# Patient Record
Sex: Female | Born: 1960 | Race: White | Marital: Married | Smoking: Former smoker
Health system: Southern US, Community
[De-identification: ages and names within clinical notes are randomized; demographics above are authoritative.]

## PROBLEM LIST (undated history)

## (undated) DIAGNOSIS — K602 Anal fissure, unspecified: Secondary | ICD-10-CM

## (undated) DIAGNOSIS — N809 Endometriosis, unspecified: Secondary | ICD-10-CM

## (undated) DIAGNOSIS — K635 Polyp of colon: Secondary | ICD-10-CM

## (undated) DIAGNOSIS — K5904 Chronic idiopathic constipation: Secondary | ICD-10-CM

## (undated) DIAGNOSIS — J309 Allergic rhinitis, unspecified: Secondary | ICD-10-CM

## (undated) HISTORY — DX: Polyp of colon: K63.5

## (undated) HISTORY — PX: HEMORRHOID SURGERY: SHX153

## (undated) HISTORY — DX: Allergic rhinitis, unspecified: J30.9

## (undated) HISTORY — PX: VASCULAR SURGERY: SHX849

## (undated) HISTORY — DX: Chronic idiopathic constipation: K59.04

## (undated) HISTORY — PX: ABDOMINAL HYSTERECTOMY: SHX81

## (undated) HISTORY — DX: Anal fissure, unspecified: K60.2

## (undated) HISTORY — DX: Endometriosis, unspecified: N80.9

---

## 2021-08-27 ENCOUNTER — Telehealth: Payer: Self-pay | Admitting: Family Medicine

## 2021-08-27 ENCOUNTER — Encounter: Payer: BC Managed Care – PPO | Admitting: Family Medicine

## 2021-08-27 NOTE — Telephone Encounter (Signed)
Spoke with patient advised intake plenty of fluids if symptoms become worse she may need to be seen at urgent care.

## 2021-09-02 ENCOUNTER — Other Ambulatory Visit: Payer: Self-pay

## 2021-09-02 ENCOUNTER — Ambulatory Visit (INDEPENDENT_AMBULATORY_CARE_PROVIDER_SITE_OTHER): Payer: BC Managed Care – PPO

## 2021-09-02 ENCOUNTER — Ambulatory Visit (INDEPENDENT_AMBULATORY_CARE_PROVIDER_SITE_OTHER): Payer: BC Managed Care – PPO | Admitting: Podiatry

## 2021-09-02 DIAGNOSIS — M7671 Peroneal tendinitis, right leg: Secondary | ICD-10-CM | POA: Diagnosis not present

## 2021-09-02 DIAGNOSIS — M79671 Pain in right foot: Secondary | ICD-10-CM

## 2021-09-02 MED ORDER — MELOXICAM 15 MG PO TABS: 15.0000 mg | ORAL_TABLET | Freq: Every day | ORAL | 1 refills | Status: DC

## 2021-09-11 DIAGNOSIS — M7671 Peroneal tendinitis, right leg: Secondary | ICD-10-CM | POA: Diagnosis not present

## 2021-09-11 MED ORDER — BETAMETHASONE SOD PHOS & ACET 6 (3-3) MG/ML IJ SUSP
3.0000 mg | Freq: Once | INTRAMUSCULAR | Status: AC
Start: 2021-09-11 — End: 2021-09-11
  Administered 2021-09-11: 3 mg via INTRA_ARTICULAR

## 2021-09-11 NOTE — Progress Notes (Signed)
   HPI: 60 y.o. female presenting today as a new patient who recently moved from [place] to be with her grandkids for evaluation of right foot pain this been going on for approximately 1-2 months.  She noticed a bone sticking out of the outside of her foot.  She also says that just yesterday she began to have some right heel pain.  She has not done anything for treatment.  She presents for further treatment and evaluation  No past medical history on file.   Physical Exam: General: The patient is alert and oriented x3 in no acute distress.  Dermatology: Skin is warm, dry and supple bilateral lower extremities. Negative for open lesions or macerations.  Vascular: Palpable pedal pulses bilaterally. No edema or erythema noted. Capillary refill within normal limits.  Neurological: Epicritic and protective threshold grossly intact bilaterally.   Musculoskeletal Exam: Range of motion within normal limits to all pedal and ankle joints bilateral. Muscle strength 5/5 in all groups bilateral.  Pain on palpation along the peroneal tendon as it inserts onto the fifth metatarsal tubercle.  There is also some pain on palpation to the plantar fascial right  Radiographic Exam:  Normal osseous mineralization. Joint spaces preserved. No fracture/dislocation/boney destruction.    Assessment: 1.  Insertional peroneal tendinitis right 2.  Acute plantar fasciitis right x1 day   Plan of Care:  1. Patient evaluated. X-Rays reviewed.  2.  Injection of 0.5 cc Celestone Soluspan injected along the peroneal tendon sheath as it inserts on the fifth metatarsal tubercle 3.  Prescription for meloxicam 15 mg daily 4.  Cam boot dispensed.  Weightbearing as tolerated x4 weeks 5.  Return to clinic in 4 weeks  *Recently moved from [place] to be by grandkids      Edrick Kins, DPM [facility]  Dr. Edrick Kins, DPM    [address], [place] [address]                 Office [phone]  Fax [phone]

## 2021-09-14 ENCOUNTER — Telehealth: Payer: Self-pay | Admitting: Podiatry

## 2021-09-14 ENCOUNTER — Other Ambulatory Visit: Payer: Self-pay

## 2021-09-14 ENCOUNTER — Ambulatory Visit (INDEPENDENT_AMBULATORY_CARE_PROVIDER_SITE_OTHER): Payer: BC Managed Care – PPO | Admitting: Podiatry

## 2021-09-14 DIAGNOSIS — M7671 Peroneal tendinitis, right leg: Secondary | ICD-10-CM | POA: Diagnosis not present

## 2021-09-14 NOTE — Progress Notes (Signed)
   HPI: 60 y.o. female pt who recently moved from [place] to be with her grandkids for evaluation of right foot pain this been going on for a few months now.  She noticed a bone sticking out of the outside of her foot.    Patient states that the injection and cam boot did not significantly improve her symptoms.  She still has some pain to the lateral aspect of the foot.  She has been wearing the cam boot intermittently.  She presents for further treatment and evaluation  No past medical history on file.   Physical Exam: General: The patient is alert and oriented x3 in no acute distress.  Dermatology: Skin is warm, dry and supple bilateral lower extremities. Negative for open lesions or macerations.  Vascular: Palpable pedal pulses bilaterally. No edema or erythema noted. Capillary refill within normal limits.  Neurological: Epicritic and protective threshold grossly intact bilaterally.   Musculoskeletal Exam: Range of motion within normal limits to all pedal and ankle joints bilateral. Muscle strength 5/5 in all groups bilateral.  There continues to be pain on palpation along the peroneal tendon as it inserts onto the fifth metatarsal tubercle.  There is also some pain on palpation to the plantar fascial right  Radiographic Exam 09/02/2021 RT foot:  Normal osseous mineralization. Joint spaces preserved. No fracture/dislocation/boney destruction.    Assessment: 1.  Insertional peroneal tendinitis right 2.  Acute plantar fasciitis right; improved   Plan of Care:  1. Patient evaluated. X-Rays reviewed.  2.  Patient has not noticed much improvement with the steroid injection and the cam boot.  She says the cam boot actually aggravates her heel occasionally.  Discontinue the cam boot  3.  Prescription for Medrol Dosepak.  Resume meloxicam 15 mg daily after completion of the Dosepak 4.  Compression ankle sleeve dispensed.  Wear daily 5.  Recommend shoes that do not rub or irritate the fifth  metatarsal tubercle prominence 6.  Return to clinic in 4 weeks  *Recently moved from [place] to be by grandkids. Daughter is here in [place].  Her son lives in [place], [place].       Edrick Kins, DPM [facility]  Dr. Edrick Kins, DPM    [address], [place] [address]                Office [phone]  Fax [phone]

## 2021-09-14 NOTE — Telephone Encounter (Signed)
[facility]

## 2021-09-15 ENCOUNTER — Other Ambulatory Visit: Payer: Self-pay | Admitting: Podiatry

## 2021-09-15 MED ORDER — METHYLPREDNISOLONE 4 MG PO TBPK: ORAL_TABLET | ORAL | 0 refills | Status: DC

## 2021-09-15 NOTE — Telephone Encounter (Signed)
Patient is calling for a medication that was supposed to be sent to pharmacy, not there (medro dose pak). Please advise.

## 2021-09-15 NOTE — Telephone Encounter (Signed)
Patient called back - Medrol Dosepak - is what needs to be [facility] [place], she had the medication confused ?

## 2021-09-15 NOTE — Telephone Encounter (Signed)
Rx sent 

## 2021-09-16 ENCOUNTER — Ambulatory Visit: Payer: BC Managed Care – PPO | Admitting: Podiatry

## 2021-09-16 NOTE — Telephone Encounter (Signed)
Patient has been notified

## 2021-09-23 ENCOUNTER — Ambulatory Visit (INDEPENDENT_AMBULATORY_CARE_PROVIDER_SITE_OTHER): Payer: BC Managed Care – PPO | Admitting: Family Medicine

## 2021-09-23 ENCOUNTER — Encounter: Payer: Self-pay | Admitting: Family Medicine

## 2021-09-23 ENCOUNTER — Other Ambulatory Visit: Payer: Self-pay

## 2021-09-23 VITALS — BP 110/70 | HR 74 | Temp 96.3°F | Ht 64.0 in | Wt 135.0 lb

## 2021-09-23 DIAGNOSIS — Z23 Encounter for immunization: Secondary | ICD-10-CM | POA: Diagnosis not present

## 2021-09-23 DIAGNOSIS — H409 Unspecified glaucoma: Secondary | ICD-10-CM | POA: Diagnosis not present

## 2021-09-23 DIAGNOSIS — Z131 Encounter for screening for diabetes mellitus: Secondary | ICD-10-CM | POA: Insufficient documentation

## 2021-09-23 DIAGNOSIS — Z1211 Encounter for screening for malignant neoplasm of colon: Secondary | ICD-10-CM | POA: Diagnosis not present

## 2021-09-23 DIAGNOSIS — M722 Plantar fascial fibromatosis: Secondary | ICD-10-CM | POA: Diagnosis not present

## 2021-09-23 DIAGNOSIS — Z136 Encounter for screening for cardiovascular disorders: Secondary | ICD-10-CM | POA: Insufficient documentation

## 2021-09-23 DIAGNOSIS — Z114 Encounter for screening for human immunodeficiency virus [HIV]: Secondary | ICD-10-CM | POA: Insufficient documentation

## 2021-09-23 DIAGNOSIS — Z0000 Encounter for general adult medical examination without abnormal findings: Secondary | ICD-10-CM | POA: Insufficient documentation

## 2021-09-23 NOTE — Progress Notes (Signed)
New Patient Office Visit  Subjective:  Patient ID: Alyssa Cox, female    DOB: [birth year]  Age: 60 y.o. MRN: [id number]  CC:  Chief Complaint  Patient presents with   Establish Care    Est care. NP.    HPI Alyssa Cox presents for establishment of care.  She moved into the area from [place] to be closer to her daughter this past July.  She is married and lives with her husband.  She has a son who lives in [place].  Her mom passed this past August back in [place].  Currently seeing a podiatrist for Planter fasciitis.  History of glaucoma.  History of anxiety.  This is been treated in the past with Celexa.  History of anxiety with flying.  She had taken lorazepam for this.  She is sad because of her mother's recent death but has not felt depressed or anxious.  history of adult acne treated with doxycycline.  History of endometriosis with a family history of ovarian cancer.  She is status post TAH.  History of vaginal atrophy and UTI treated with vaginal estrogen cream.  History of muscle spasms treated with Robaxin in the past.  History of labyrinthitis treated with meclizine.  Status post colonoscopy in 2018 with a polyp.  She was advised to return in 5 years.  Mammogram was 2 years ago.  She exercises by walking which is been compromised with her Planter fasciitis.  She quit smoking 8 years ago.  History reviewed. No pertinent past medical history.  History reviewed. No pertinent surgical history.  History reviewed. No pertinent family history.  Social History   Socioeconomic History   Marital status: Married    Spouse name: Not on file   Number of children: Not on file   Years of education: Not on file   Highest education level: Not on file  Occupational History   Not on file  Tobacco Use   Smoking status: Former    Types: Cigarettes   Smokeless tobacco: Never  Vaping Use   Vaping Use: Never used  Substance and Sexual Activity   Alcohol use: Yes    Alcohol/week:  2.0 standard drinks    Types: 2 Glasses of wine per week    Comment: 1-2 drinks a week   Drug use: Never   Sexual activity: Not on file  Other Topics Concern   Not on file  Social History Narrative   Not on file   Social Determinants of Health   Financial Resource Strain: Not on file  Food Insecurity: Not on file  Transportation Needs: Not on file  Physical Activity: Not on file  Stress: Not on file  Social Connections: Not on file  Intimate Partner Violence: Not on file    ROS Review of Systems  Constitutional:  Negative for diaphoresis, fatigue, fever and unexpected weight change.  HENT: Negative.    Eyes:  Negative for photophobia and visual disturbance.  Respiratory: Negative.    Cardiovascular: Negative.   Gastrointestinal: Negative.   Genitourinary: Negative.   Musculoskeletal: Negative.   Neurological:  Negative for facial asymmetry and speech difficulty.   Objective:   Today's Vitals: BP 110/70   Pulse 74   Temp (!) 96.3 F (35.7 C) (Temporal)   Ht 5\' 4"  (1.626 m)   Wt 135 lb (61.2 kg)   SpO2 99%   BMI 23.17 kg/m   Physical Exam Vitals and nursing note reviewed.  Constitutional:      General: She is not  in acute distress.    Appearance: Normal appearance. She is not ill-appearing, toxic-appearing or diaphoretic.  HENT:     Head: Normocephalic and atraumatic.     Right Ear: External ear normal.     Left Ear: External ear normal.  Eyes:     General: No scleral icterus.       Right eye: No discharge.        Left eye: No discharge.     Extraocular Movements: Extraocular movements intact.     Conjunctiva/sclera: Conjunctivae normal.  Cardiovascular:     Rate and Rhythm: Normal rate and regular rhythm.  Pulmonary:     Effort: Pulmonary effort is normal.     Breath sounds: Normal breath sounds.  Neurological:     Mental Status: She is alert and oriented to person, place, and time.  Psychiatric:        Mood and Affect: Mood normal.        Behavior:  Behavior normal.    Assessment & Plan:   Problem List Items Addressed This Visit       Musculoskeletal and Integument   Plantar fasciitis     Other   Glaucoma - Primary   Relevant Orders   Ambulatory referral to Ophthalmology   Screen for colon cancer   Relevant Orders   Ambulatory referral to Gastroenterology   Need for shingles vaccine   Relevant Orders   Varicella-zoster vaccine IM (Shingrix) (Completed)    Outpatient Encounter Medications as of 09/23/2021  Medication Sig   latanoprost (XALATAN) 0.005 % ophthalmic solution latanoprost 0.005 % eye drops  INSTILL 1 DROP INTO BOTH EYES AT BEDTIME   meclizine (ANTIVERT) 25 MG tablet Take 25 mg by mouth 3 (three) times daily as needed for dizziness.   meloxicam (MOBIC) 15 MG tablet Take 1 tablet (15 mg total) by mouth daily.   methocarbamol (ROBAXIN) 750 MG tablet methocarbamol 750 mg tablet  TAKE 1 TABLET BY MOUTH 3 TIMES A DAY AS NEEDED FOR PAIN   [DISCONTINUED] citalopram (CELEXA) 10 MG tablet Take 10 mg by mouth daily.   [DISCONTINUED] doxycycline (VIBRAMYCIN) 50 MG capsule doxycycline hyclate 50 mg capsule  TAKE 1 CAPSULE BY MOUTH EVERY DAY WITH FOOD AND WATER   [DISCONTINUED] LORazepam (ATIVAN) 1 MG tablet Take 1 mg by mouth every 8 (eight) hours.   [DISCONTINUED] Sulfacetamide Sodium-Sulfur 10-1 % EMUL Apply topically.   [DISCONTINUED] sulfamethoxazole-trimethoprim (BACTRIM) 200-40 MG/5ML suspension Take by mouth 2 (two) times daily.   azithromycin (ZITHROMAX) 250 MG tablet Zithromax Z-Pak 250 mg tablet  TAKE 2 TABLETS (500 MG) BY ORAL ROUTE ONCE DAILY FOR 1 DAY THEN 1 TABLET (250 MG) BY ORAL ROUTE ONCE DAILY FOR 4 DAYS (Patient not taking: Reported on 09/23/2021)   [DISCONTINUED] albuterol (VENTOLIN HFA) 108 (90 Base) MCG/ACT inhaler albuterol sulfate HFA 90 mcg/actuation aerosol inhaler  Inhale 2 puffs every 4 hours by inhalation route as needed. (Patient not taking: Reported on 09/23/2021)   [DISCONTINUED] amoxicillin  (AMOXIL) 500 MG tablet amoxicillin 500 mg tablet  TAKE 2 TABLETS BY MOUTH TWICE A DAY (Patient not taking: Reported on 09/23/2021)   [DISCONTINUED] benzonatate (TESSALON) 200 MG capsule Take 200 mg by mouth 3 (three) times daily as needed.   [DISCONTINUED] methylPREDNISolone (MEDROL DOSEPAK) 4 MG TBPK tablet 6 day dose pack - take as directed (Patient not taking: Reported on 09/23/2021)   No facility-administered encounter medications on file as of 09/23/2021.    Follow-up: Return Return fasting for physical exam with blood work.Marland Kitchen  Spent 30 minutes taking history and formulating plan going forward.  She will return fasting for a physical exam and blood work.  Have referred for follow-up colonoscopy because it is time, mammogram, GYN care and follow-up with ophthalmology for glaucoma.  She will continue with podiatry for Planter fasciitis. Libby Maw, MD

## 2021-10-05 ENCOUNTER — Ambulatory Visit (INDEPENDENT_AMBULATORY_CARE_PROVIDER_SITE_OTHER): Payer: BC Managed Care – PPO | Admitting: Podiatry

## 2021-10-05 ENCOUNTER — Other Ambulatory Visit: Payer: Self-pay

## 2021-10-05 DIAGNOSIS — M7671 Peroneal tendinitis, right leg: Secondary | ICD-10-CM

## 2021-10-05 DIAGNOSIS — M722 Plantar fascial fibromatosis: Secondary | ICD-10-CM

## 2021-10-05 NOTE — Progress Notes (Signed)
° °  HPI: 60 y.o. female pt who recently moved from [place] to be with her grandkids for follow-up evaluation of right foot pain.  Patient states that she is feeling slightly better.  She says some of the redness and swelling has decreased.  There is some improvement since last visit. Patient has not had great success in the past with immobilization in the cam boot or steroid injection.  She completed a Medrol Dosepak with improvement and she currently takes the meloxicam daily  No past medical history on file.   Physical Exam: General: The patient is alert and oriented x3 in no acute distress.  Dermatology: Skin is warm, dry and supple bilateral lower extremities. Negative for open lesions or macerations.  Vascular: Palpable pedal pulses bilaterally. No edema or erythema noted. Capillary refill within normal limits.  Neurological: Epicritic and protective threshold grossly intact bilaterally.   Musculoskeletal Exam: Range of motion within normal limits to all pedal and ankle joints bilateral. Muscle strength 5/5 in all groups bilateral.  There continues to be pain on palpation along the peroneal tendon as it inserts onto the fifth metatarsal tubercle.  There is also some pain on palpation to the plantar fascia right   Assessment: 1.  Insertional peroneal tendinitis right 2.  Acute plantar fasciitis right   Plan of Care:  1. Patient evaluated.  2.  Appointment with Pedorthist for custom molded orthotics 3.  Continue meloxicam 15 mg daily as needed 4.  Continue compression ankle sleeve daily 5.  Return to clinic for orthotics fitting.  As needed with me  *Recently moved from [place] to be by grandkids. Daughter is here in [place].  Her son lives in [place], [place].       Edrick Kins, DPM [facility]  Dr. Edrick Kins, DPM    [address], [place] [address]                Office [phone]  Fax [phone]

## 2021-10-07 ENCOUNTER — Ambulatory Visit: Payer: BC Managed Care – PPO | Admitting: Podiatry

## 2021-10-31 ENCOUNTER — Other Ambulatory Visit: Payer: Self-pay | Admitting: Podiatry

## 2021-11-03 ENCOUNTER — Other Ambulatory Visit: Payer: Self-pay

## 2021-11-03 ENCOUNTER — Ambulatory Visit: Payer: BC Managed Care – PPO

## 2021-11-03 DIAGNOSIS — M7671 Peroneal tendinitis, right leg: Secondary | ICD-10-CM

## 2021-11-03 DIAGNOSIS — M722 Plantar fascial fibromatosis: Secondary | ICD-10-CM

## 2021-11-03 NOTE — Progress Notes (Signed)
SITUATION Reason for Consult: Evaluation for Bilateral Custom Foot Orthoses Patient / Caregiver Report: Patient is ready for custom insoles  OBJECTIVE DATA: Patient History / Diagnosis:    ICD-10-CM   1. Plantar fasciitis, right  M72.2     2. Peroneal tendinitis, right  M76.71       Current or Previous Devices: None and no history  Foot Examination: Skin presentation:   Intact Ulcers & Callousing:   None and no history Toe / Foot Deformities:  Pes Cavus Weight Bearing Presentation:  Cavus Sensation:    Intact  ORTHOTIC RECOMMENDATION Recommended Device: 1x pair of custom functional foot orthotics  GOALS OF ORTHOSES - Reduce Pain - Prevent Foot Deformity - Prevent Progression of Further Foot Deformity - Relieve Pressure - Improve the Overall Biomechanical Function of the Foot and Lower Extremity.  ACTIONS PERFORMED Patient was casted for Foot Orthoses via crush box. Procedure was explained and patient tolerated procedure well. All questions were answered and concerns addressed.  PLAN Potential out of pocket cost was communicated to patient. Casts are to be sent to [facility] for fabrication. Patient is to be called for fitting when devices are ready.

## 2021-11-19 ENCOUNTER — Other Ambulatory Visit: Payer: Self-pay

## 2021-11-19 ENCOUNTER — Encounter: Payer: Self-pay | Admitting: Obstetrics and Gynecology

## 2021-11-19 ENCOUNTER — Ambulatory Visit (INDEPENDENT_AMBULATORY_CARE_PROVIDER_SITE_OTHER): Payer: BC Managed Care – PPO | Admitting: Obstetrics and Gynecology

## 2021-11-19 VITALS — BP 130/78 | HR 82 | Ht 65.0 in | Wt 138.1 lb

## 2021-11-19 DIAGNOSIS — Z01419 Encounter for gynecological examination (general) (routine) without abnormal findings: Secondary | ICD-10-CM | POA: Diagnosis not present

## 2021-11-19 DIAGNOSIS — Z90710 Acquired absence of both cervix and uterus: Secondary | ICD-10-CM

## 2021-11-19 DIAGNOSIS — Z90722 Acquired absence of ovaries, bilateral: Secondary | ICD-10-CM

## 2021-11-19 DIAGNOSIS — Z124 Encounter for screening for malignant neoplasm of cervix: Secondary | ICD-10-CM

## 2021-11-19 DIAGNOSIS — N952 Postmenopausal atrophic vaginitis: Secondary | ICD-10-CM

## 2021-11-19 DIAGNOSIS — Z9079 Acquired absence of other genital organ(s): Secondary | ICD-10-CM

## 2021-11-19 DIAGNOSIS — Z1239 Encounter for other screening for malignant neoplasm of breast: Secondary | ICD-10-CM | POA: Diagnosis not present

## 2021-11-19 MED ORDER — ESTRADIOL 0.1 MG/GM VA CREA: 2.0000 g | TOPICAL_CREAM | VAGINAL | 5 refills | Status: DC

## 2021-11-19 NOTE — Patient Instructions (Signed)

## 2021-11-19 NOTE — Progress Notes (Signed)
Alyssa Cox is a 61 y.o. G2P0 female here for a routine annual gynecologic exam.  Current complaints: Vaginal atrophy. Has been using a small amount of Estrace, when she can remember.   It has helped some. Denies abnormal vaginal bleeding, discharge, pelvic pain,or other gynecologic concerns.    Gynecologic History No LMP recorded. Patient has had a hysterectomy.with BSO d/t endometriosis Contraception: status post hysterectomy   Obstetric History OB History  Gravida Para Term Preterm AB Living  2         2  SAB IAB Ectopic Multiple Live Births          2    # Outcome Date GA Lbr Len/2nd Weight Sex Delivery Anes PTL Lv  2 Gravida           1 Gravida             History reviewed. No pertinent past medical history.  Past Surgical History:  Procedure Laterality Date   ABDOMINAL HYSTERECTOMY     CESAREAN SECTION     VASCULAR SURGERY      Current Outpatient Medications on File Prior to Visit  Medication Sig Dispense Refill   latanoprost (XALATAN) 0.005 % ophthalmic solution latanoprost 0.005 % eye drops  INSTILL 1 DROP INTO BOTH EYES AT BEDTIME     No current facility-administered medications on file prior to visit.    No Known Allergies  Social History   Socioeconomic History   Marital status: Married    Spouse name: Not on file   Number of children: Not on file   Years of education: Not on file   Highest education level: Not on file  Occupational History   Not on file  Tobacco Use   Smoking status: Former    Types: Cigarettes    Quit date: 10/03/2013    Years since quitting: 8.1   Smokeless tobacco: Never  Vaping Use   Vaping Use: Never used  Substance and Sexual Activity   Alcohol use: Yes    Alcohol/week: 2.0 standard drinks    Types: 2 Glasses of wine per week    Comment: 1-2 drinks a week   Drug use: Never   Sexual activity: Yes    Birth control/protection: Surgical  Other Topics Concern   Not on file  Social History Narrative   Not on file    Social Determinants of Health   Financial Resource Strain: Not on file  Food Insecurity: Not on file  Transportation Needs: Not on file  Physical Activity: Not on file  Stress: Not on file  Social Connections: Not on file  Intimate Partner Violence: Not on file    History reviewed. No pertinent family history.  The following portions of the patient's history were reviewed and updated as appropriate: allergies, current medications, past family history, past medical history, past social history, past surgical history and problem list.  Review of Systems Pertinent items noted in HPI and remainder of comprehensive ROS otherwise negative.   Objective:  BP 130/78    Pulse 82    Ht 5\' 5"  (1.651 m)    Wt 138 lb 1.6 oz (62.6 kg)    BMI 22.98 kg/m  CONSTITUTIONAL: Well-developed, well-nourished female in no acute distress.  HENT:  Normocephalic, atraumatic, External right and left ear normal. Oropharynx is clear and moist EYES: Conjunctivae and EOM are normal. Pupils are equal, round, and reactive to light. No scleral icterus.  NECK: Normal range of motion, supple, no masses.  Normal thyroid.  SKIN: Skin is warm and dry. No rash noted. Not diaphoretic. No erythema. No pallor. [place]: Alert and oriented to person, place, and time. Normal reflexes, muscle tone coordination. No cranial nerve deficit noted. PSYCHIATRIC: Normal mood and affect. Normal behavior. Normal judgment and thought content. CARDIOVASCULAR: Normal heart rate noted, regular rhythm RESPIRATORY: Clear to auscultation bilaterally. Effort and breath sounds normal, no problems with respiration noted. BREASTS: Symmetric in size. No masses, skin changes, nipple drainage, or lymphadenopathy. ABDOMEN: Soft, normal bowel sounds, no distention noted.  No tenderness, rebound or guarding.  PELVIC: Deffered MUSCULOSKELETAL: Normal range of motion. No tenderness.  No cyanosis, clubbing, or edema.  2+ distal pulses.   Assessment:   Annual gynecologic examination with pap smear Vaginal atrophy  Plan:  Will follow up results of pap smear and manage accordingly. Mammogram scheduled Discussed use of Estrace cream for vaginal atrophy. Will increase to 2 gms vaginally every other day. F/U in 2 months Routine preventative health maintenance measures emphasized. Please refer to After Visit Summary for other counseling recommendations.    Chancy Milroy, MD, [place] Attending [place] for [facility], [place]

## 2021-11-20 ENCOUNTER — Other Ambulatory Visit: Payer: Self-pay | Admitting: Family Medicine

## 2021-11-26 ENCOUNTER — Encounter: Payer: Self-pay | Admitting: Family Medicine

## 2021-11-26 ENCOUNTER — Ambulatory Visit (INDEPENDENT_AMBULATORY_CARE_PROVIDER_SITE_OTHER): Payer: BC Managed Care – PPO | Admitting: Family Medicine

## 2021-11-26 ENCOUNTER — Other Ambulatory Visit: Payer: Self-pay

## 2021-11-26 VITALS — BP 128/72 | HR 74 | Temp 97.1°F | Ht 65.0 in | Wt 136.8 lb

## 2021-11-26 DIAGNOSIS — L853 Xerosis cutis: Secondary | ICD-10-CM | POA: Insufficient documentation

## 2021-11-26 DIAGNOSIS — B349 Viral infection, unspecified: Secondary | ICD-10-CM

## 2021-11-26 DIAGNOSIS — J4521 Mild intermittent asthma with (acute) exacerbation: Secondary | ICD-10-CM | POA: Diagnosis not present

## 2021-11-26 DIAGNOSIS — T7840XA Allergy, unspecified, initial encounter: Secondary | ICD-10-CM | POA: Insufficient documentation

## 2021-11-26 MED ORDER — PREDNISONE 20 MG PO TABS: 20.0000 mg | ORAL_TABLET | Freq: Two times a day (BID) | ORAL | 0 refills | Status: AC

## 2021-11-26 MED ORDER — ALBUTEROL SULFATE HFA 108 (90 BASE) MCG/ACT IN AERS
1.0000 | INHALATION_SPRAY | Freq: Four times a day (QID) | RESPIRATORY_TRACT | 2 refills | Status: DC | PRN
Start: 2021-11-26 — End: 2023-01-11

## 2021-11-26 MED ORDER — METHYLPREDNISOLONE SODIUM SUCC 40 MG IJ SOLR: 40.0000 mg | Freq: Once | INTRAMUSCULAR | Status: DC

## 2021-11-26 MED ORDER — METHYLPREDNISOLONE SODIUM SUCC 125 MG IJ SOLR
125.0000 mg | Freq: Once | INTRAMUSCULAR | Status: AC
  Administered 2021-11-26: 125 mg via INTRAMUSCULAR

## 2021-11-26 NOTE — Progress Notes (Signed)
Established Patient Office Visit  Subjective:  Patient ID: Alyssa Cox, female    DOB: 15-Apr-1961  Age: 61 y.o. MRN: [id number]  CC:  Chief Complaint  Patient presents with   Cough    Cough, sore throat, eye full body aches, SOB symptoms x 4 days.     HPI Alyssa Cox presents for 4-day history of URI signs and symptoms with headache, nasal congestion, sinus pressure, rhinorrhea postnasal drip, cough, myalgias and arthralgias.  There have been chills without fever.  She has felt tightness and wheezing especially with exertion.  There is been no chest pain nausea or diaphoresis.  Similar symptoms with a COVID infection back in June.  She was given an inhaler to use that was helpful.  Home COVID test was negative.  She is scheduled to fly out to [facility] to see her son tomorrow.  No past medical history on file.  Past Surgical History:  Procedure Laterality Date   ABDOMINAL HYSTERECTOMY     CESAREAN SECTION     VASCULAR SURGERY      No family history on file.  Social History   Socioeconomic History   Marital status: Married    Spouse name: Not on file   Number of children: Not on file   Years of education: Not on file   Highest education level: Not on file  Occupational History   Not on file  Tobacco Use   Smoking status: Former    Types: Cigarettes    Quit date: 10/03/2013    Years since quitting: 8.1   Smokeless tobacco: Never  Vaping Use   Vaping Use: Never used  Substance and Sexual Activity   Alcohol use: Yes    Alcohol/week: 2.0 standard drinks    Types: 2 Glasses of wine per week    Comment: 1-2 drinks a week   Drug use: Never   Sexual activity: Yes    Birth control/protection: Surgical  Other Topics Concern   Not on file  Social History Narrative   Not on file   Social Determinants of Health   Financial Resource Strain: Not on file  Food Insecurity: Not on file  Transportation Needs: Not on file  Physical Activity: Not on file  Stress: Not on  file  Social Connections: Not on file  Intimate Partner Violence: Not on file    Outpatient Medications Prior to Visit  Medication Sig Dispense Refill   estradiol (ESTRACE VAGINAL) 0.1 MG/GM vaginal cream Place 2 g vaginally every other day. 42.5 g 5   latanoprost (XALATAN) 0.005 % ophthalmic solution latanoprost 0.005 % eye drops  INSTILL 1 DROP INTO BOTH EYES AT BEDTIME     No facility-administered medications prior to visit.    No Known Allergies  ROS Review of Systems  Constitutional:  Positive for chills and fatigue. Negative for diaphoresis, fever and unexpected weight change.  HENT:  Positive for congestion, postnasal drip, rhinorrhea, sinus pressure and sore throat.   Eyes:  Negative for photophobia and visual disturbance.  Respiratory:  Positive for chest tightness, shortness of breath and wheezing.   Cardiovascular:  Negative for chest pain and palpitations.  Gastrointestinal:  Negative for nausea and vomiting.  Musculoskeletal:  Positive for arthralgias and myalgias.  Neurological:  Positive for light-headedness. Negative for speech difficulty and weakness.     Objective:    Physical Exam Vitals and nursing note reviewed.  Constitutional:      General: She is not in acute distress.    Appearance: Normal  appearance. She is not ill-appearing, toxic-appearing or diaphoretic.  HENT:     Head: Normocephalic and atraumatic.     Right Ear: Tympanic membrane, ear canal and external ear normal.     Left Ear: Tympanic membrane, ear canal and external ear normal.     Mouth/Throat:     Mouth: Mucous membranes are moist.     Pharynx: Oropharynx is clear. No oropharyngeal exudate or posterior oropharyngeal erythema.  Neck:     Vascular: No carotid bruit.  Cardiovascular:     Rate and Rhythm: Normal rate and regular rhythm.  Pulmonary:     Effort: Pulmonary effort is normal. No respiratory distress.     Breath sounds: Normal breath sounds. No wheezing, rhonchi or rales.   Abdominal:     General: Bowel sounds are normal.  Musculoskeletal:     Cervical back: No rigidity or tenderness.  Lymphadenopathy:     Cervical: No cervical adenopathy.  Skin:    General: Skin is warm and dry.  Neurological:     Mental Status: She is alert and oriented to person, place, and time.  Psychiatric:        Mood and Affect: Mood normal.        Behavior: Behavior normal.    BP 128/72 (BP Location: Right Arm, Patient Position: Sitting, Cuff Size: Normal)    Pulse 74    Temp (!) 97.1 F (36.2 C) (Temporal)    Ht _0  (1.651 m)    Wt 136 lb 12.8 oz (62.1 kg)    SpO2 98%    BMI 22.76 kg/m  Wt Readings from Last 3 Encounters:  11/26/21 136 lb 12.8 oz (62.1 kg)  11/19/21 138 lb 1.6 oz (62.6 kg)  09/23/21 135 lb (61.2 kg)     Health Maintenance Due  Topic Date Due   HIV Screening  Never done   Hepatitis C Screening  Never done   TETANUS/TDAP  Never done   PAP SMEAR-Modifier  Never done   MAMMOGRAM  Never done   Zoster Vaccines- Shingrix (2 of 2) 11/18/2021    There are no preventive care reminders to display for this patient.  No results found for: TSH No results found for: WBC, HGB, HCT, MCV, PLT No results found for: NA, K, CHLORIDE, CO2, GLUCOSE, BUN, CREATININE, BILITOT, ALKPHOS, AST, ALT, PROT, ALBUMIN, CALCIUM, ANIONGAP, EGFR, GFR No results found for: CHOL No results found for: HDL No results found for: LDLCALC No results found for: TRIG No results found for: CHOLHDL No results found for: HGBA1C    Assessment & Plan:   Problem List Items Addressed This Visit   None Visit Diagnoses     Viral syndrome    -  Primary   Relevant Orders   COVID-19, Flu A+B and RSV   Mild intermittent reactive airway disease with acute exacerbation       Relevant Medications   predniSONE (DELTASONE) 20 MG tablet   albuterol (VENTOLIN HFA) 108 (90 Base) MCG/ACT inhaler   methylPREDNISolone sodium succinate (SOLU-MEDROL) 125 mg/2 mL injection 125 mg (Completed) (Start  on 11/26/2021  5:00 PM)       Meds ordered this encounter  Medications   DISCONTD: methylPREDNISolone sodium succinate (SOLU-MEDROL) 40 mg/mL injection 40 mg   predniSONE (DELTASONE) 20 MG tablet    Sig: Take 1 tablet (20 mg total) by mouth 2 (two) times daily with a meal for 7 days.    Dispense:  14 tablet    Refill:  0  albuterol (VENTOLIN HFA) 108 (90 Base) MCG/ACT inhaler    Sig: Inhale 1-2 puffs into the lungs every 6 (six) hours as needed for wheezing or shortness of breath.    Dispense:  8 g    Refill:  2   methylPREDNISolone sodium succinate (SOLU-MEDROL) 125 mg/2 mL injection 125 mg    Follow-up: Return in about 1 week (around 12/03/2021), or if symptoms worsen or fail to improve.  We will start prednisone tomorrow morning.  Will use inhaler for tightness wheezing or cough as needed.  Follow-up in 1 week if not improving.  If worse in [place] should be seen emergently.  Libby Maw, MD

## 2021-11-29 LAB — COVID-19, FLU A+B AND RSV
Influenza A, NAA: NOT DETECTED
Influenza B, NAA: NOT DETECTED
RSV, NAA: NOT DETECTED
SARS-CoV-2, NAA: NOT DETECTED

## 2021-12-01 ENCOUNTER — Ambulatory Visit: Payer: BC Managed Care – PPO

## 2021-12-03 ENCOUNTER — Ambulatory Visit
Admission: RE | Admit: 2021-12-03 | Discharge: 2021-12-03 | Disposition: A | Discharge status: Home / Self Care | Payer: BC Managed Care – PPO | Source: Ambulatory Visit | Attending: Obstetrics and Gynecology | Admitting: Obstetrics and Gynecology

## 2021-12-03 DIAGNOSIS — Z1239 Encounter for other screening for malignant neoplasm of breast: Secondary | ICD-10-CM

## 2021-12-04 ENCOUNTER — Ambulatory Visit: Payer: BC Managed Care – PPO | Admitting: Nurse Practitioner

## 2021-12-04 ENCOUNTER — Encounter: Payer: Self-pay | Admitting: Physician Assistant

## 2021-12-04 ENCOUNTER — Encounter: Payer: Self-pay | Admitting: Nurse Practitioner

## 2021-12-04 ENCOUNTER — Ambulatory Visit: Payer: BC Managed Care – PPO

## 2021-12-04 ENCOUNTER — Other Ambulatory Visit: Payer: Self-pay

## 2021-12-04 ENCOUNTER — Ambulatory Visit (INDEPENDENT_AMBULATORY_CARE_PROVIDER_SITE_OTHER): Payer: BC Managed Care – PPO | Admitting: Nurse Practitioner

## 2021-12-04 VITALS — BP 120/70 | HR 80 | Temp 97.6°F | Ht 65.0 in | Wt 138.4 lb

## 2021-12-04 DIAGNOSIS — M722 Plantar fascial fibromatosis: Secondary | ICD-10-CM

## 2021-12-04 DIAGNOSIS — H6983 Other specified disorders of Eustachian tube, bilateral: Secondary | ICD-10-CM

## 2021-12-04 DIAGNOSIS — M7671 Peroneal tendinitis, right leg: Secondary | ICD-10-CM

## 2021-12-04 MED ORDER — CETIRIZINE-PSEUDOEPHEDRINE ER 5-120 MG PO TB12: 1.0000 | ORAL_TABLET | Freq: Two times a day (BID) | ORAL | Status: DC

## 2021-12-04 NOTE — Patient Instructions (Addendum)
Start Zyrtec D 1tab BID x 3days. Stop sooner if BP>140/80.  Wt Readings from Last 3 Encounters:  12/04/21 138 lb 6.4 oz (62.8 kg)  11/26/21 136 lb 12.8 oz (62.1 kg)  11/19/21 138 lb 1.6 oz (62.6 kg)    Eustachian Tube Dysfunction Eustachian tube dysfunction refers to a condition in which a blockage develops in the narrow passage that connects the middle ear to the back of the nose (eustachian tube). The eustachian tube regulates air pressure in the middle ear by letting air move between the ear and nose. It also helps to drain fluid from the middle ear space. Eustachian tube dysfunction can affect one or both ears. When the eustachian tube does not function properly, air pressure, fluid, or both can build up in the middle ear. What are the causes? This condition occurs when the eustachian tube becomes blocked or cannot open normally. Common causes of this condition include: Ear infections. Colds and other infections that affect the nose, mouth, and throat (upper respiratory tract). Allergies. Irritation from cigarette smoke. Irritation from stomach acid coming up into the esophagus (gastroesophageal reflux). The esophagus is the part of the body that moves food from the mouth to the stomach. Sudden changes in air pressure, such as from descending in an airplane or scuba diving. Abnormal growths in the nose or throat, such as: Growths that line the nose (nasal polyps). Abnormal growth of cells (tumors). Enlarged tissue at the back of the throat (adenoids). What increases the risk? You are more likely to develop this condition if: You smoke. You are overweight. You are a child who has: Certain birth defects of the mouth, such as cleft palate. Large tonsils or adenoids. What are the signs or symptoms? Common symptoms of this condition include: A feeling of fullness in the ear. Ear pain. Clicking or popping noises in the ear. Ringing in the ear (tinnitus). Hearing loss. Loss of  balance. Dizziness. Symptoms may get worse when the air pressure around you changes, such as when you travel to an area of high elevation, fly on an airplane, or go scuba diving. How is this diagnosed? This condition may be diagnosed based on: Your symptoms. A physical exam of your ears, nose, and throat. Tests, such as those that measure: The movement of your eardrum. Your hearing (audiometry). How is this treated? Treatment depends on the cause and severity of your condition. In mild cases, you may relieve your symptoms by moving air into your ears. This is called "popping the ears." In more severe cases, or if you have symptoms of fluid in your ears, treatment may include: Medicines to relieve congestion (decongestants). Medicines that treat allergies (antihistamines). Nasal sprays or ear drops that contain medicines that reduce swelling (steroids). A procedure to drain the fluid in your eardrum. In this procedure, a small tube may be placed in the eardrum to: Drain the fluid. Restore the air in the middle ear space. A procedure to insert a balloon device through the nose to inflate the opening of the eustachian tube (balloon dilation). Follow these instructions at home: Lifestyle Do not do any of the following until your health care provider approves: Travel to high altitudes. Fly in airplanes. Work in a Pension scheme manager or room. Scuba dive. Do not use any products that contain nicotine or tobacco. These products include cigarettes, chewing tobacco, and vaping devices, such as e-cigarettes. If you need help quitting, ask your health care provider. Keep your ears dry. Wear fitted earplugs during showering and  bathing. Dry your ears completely after. General instructions Take over-the-counter and prescription medicines only as told by your health care provider. Use techniques to help pop your ears as recommended by your health care provider. These may include: Chewing  gum. Yawning. Frequent, forceful swallowing. Closing your mouth, holding your nose closed, and gently blowing as if you are trying to blow air out of your nose. Keep all follow-up visits. This is important. Contact a health care provider if: Your symptoms do not go away after treatment. Your symptoms come back after treatment. You are unable to pop your ears. You have: A fever. Pain in your ear. Pain in your head or neck. Fluid draining from your ear. Your hearing suddenly changes. You become very dizzy. You lose your balance. Get help right away if: You have a sudden, severe increase in any of your symptoms. Summary Eustachian tube dysfunction refers to a condition in which a blockage develops in the eustachian tube. It can be caused by ear infections, allergies, inhaled irritants, or abnormal growths in the nose or throat. Symptoms may include ear pain or fullness, hearing loss, or ringing in the ears. Mild cases are treated with techniques to unblock the ears, such as yawning or chewing gum. More severe cases are treated with medicines or procedures. This information is not intended to replace advice given to you by your health care provider. Make sure you discuss any questions you have with your health care provider. Document Revised: 12/15/2020 Document Reviewed: 12/15/2020 Elsevier Patient Education  2022 [employer].

## 2021-12-04 NOTE — Progress Notes (Signed)
SITUATION: Reason for Visit: Fitting and Delivery of Custom Fabricated Foot Orthoses Patient Report: Patient reports comfort and is satisfied with device.  OBJECTIVE DATA: Patient History / Diagnosis:     ICD-10-CM   1. Plantar fasciitis, right  M72.2     2. Peroneal tendinitis, right  M76.71       Provided Device:  Custom Functional Foot Orthotics     [employer]: [id number]  GOAL OF ORTHOSIS - Improve gait - Decrease energy expenditure - Improve Balance - Provide Triplanar stability of foot complex - Facilitate motion  ACTIONS PERFORMED Patient was fit with foot orthotics trimmed to shoe last. Patient tolerated fittign procedure.   Patient was provided with verbal and written instruction and demonstration regarding donning, doffing, wear, care, proper fit, function, purpose, cleaning, and use of the orthosis and in all related precautions and risks and benefits regarding the orthosis.  Patient was also provided with verbal instruction regarding how to report any failures or malfunctions of the orthosis and necessary follow up care. Patient was also instructed to contact our office regarding any change in status that may affect the function of the orthosis.  Patient demonstrated independence with proper donning, doffing, and fit and verbalized understanding of all instructions.  PLAN: Patient is to follow up in one week or as necessary (PRN). All questions were answered and concerns addressed. Plan of care was discussed with and agreed upon by the patient.

## 2021-12-04 NOTE — Progress Notes (Signed)
Subjective:  Patient ID: Alyssa Cox, female    DOB: Nov 04, 1960  Age: 61 y.o. MRN: [id number]  CC: Acute Visit (Pt c/o left ear pain x 3 days. )  Otalgia  There is pain in the left ear. This is a new problem. The current episode started 1 to 4 weeks ago. The problem occurs constantly. The problem has been unchanged. There has been no fever. Pertinent negatives include no abdominal pain, coughing, diarrhea, ear discharge, headaches, hearing loss, neck pain, rash, rhinorrhea, sore throat or vomiting. She has tried acetaminophen for the symptoms. The treatment provided mild relief. There is no history of a chronic ear infection, hearing loss or a tympanostomy tube.  Recent flight from [facility] oral prednisone prior to [place] trip.   Reviewed past Medical, Social and Family history today.  Outpatient Medications Prior to Visit  Medication Sig Dispense Refill   albuterol (VENTOLIN HFA) 108 (90 Base) MCG/ACT inhaler Inhale 1-2 puffs into the lungs every 6 (six) hours as needed for wheezing or shortness of breath. 8 g 2   estradiol (ESTRACE VAGINAL) 0.1 MG/GM vaginal cream Place 2 g vaginally every other day. 42.5 g 5   latanoprost (XALATAN) 0.005 % ophthalmic solution latanoprost 0.005 % eye drops  INSTILL 1 DROP INTO BOTH EYES AT BEDTIME     No facility-administered medications prior to visit.    ROS See HPI  Objective:  BP 120/70 (BP Location: Left Arm, Patient Position: Sitting, Cuff Size: Normal)    Pulse 80    Temp 97.6 F (36.4 C) (Temporal)    Ht 5\' 5"  (1.651 m)    Wt 138 lb 6.4 oz (62.8 kg)    SpO2 98%    BMI 23.03 kg/m   Physical Exam HENT:     Right Ear: Ear canal and external ear normal. No decreased hearing noted. No drainage or swelling. A middle ear effusion is present. There is no impacted cerumen. No foreign body. No mastoid tenderness. Tympanic membrane is not injected, scarred or perforated.     Left Ear: Ear canal and external ear normal. No decreased  hearing noted. No drainage or swelling. A middle ear effusion is present. There is no impacted cerumen. No foreign body. No mastoid tenderness. Tympanic membrane is not injected, scarred or perforated.  Cardiovascular:     Rate and Rhythm: Normal rate and regular rhythm.     Pulses: Normal pulses.     Heart sounds: Normal heart sounds.  Pulmonary:     Effort: Pulmonary effort is normal.     Breath sounds: Normal breath sounds.  Musculoskeletal:     Cervical back: Normal range of motion and neck supple.     Right lower leg: No edema.     Left lower leg: No edema.  Lymphadenopathy:     Cervical: No cervical adenopathy.  Neurological:     Mental Status: She is alert and oriented to person, place, and time.  Psychiatric:        Mood and Affect: Mood normal.        Behavior: Behavior normal.        Thought Content: Thought content normal.   Assessment & Plan:  This visit occurred during the SARS-CoV-2 public health emergency.  Safety protocols were in place, including screening questions prior to the visit, additional usage of staff PPE, and extensive cleaning of exam room while observing appropriate contact time as indicated for disinfecting solutions.   Alyssa Cox was seen today for acute visit.  Diagnoses and all orders for this visit:  Eustachian tube dysfunction, bilateral -     cetirizine-pseudoephedrine (ZYRTEC-D) 5-120 MG tablet; Take 1 tablet by mouth 2 (two) times daily.   Problem List Items Addressed This Visit   None Visit Diagnoses     Eustachian tube dysfunction, bilateral    -  Primary   Relevant Medications   cetirizine-pseudoephedrine (ZYRTEC-D) 5-120 MG tablet       Follow-up: Return if symptoms worsen or fail to improve.  Wilfred Lacy, NP

## 2021-12-14 ENCOUNTER — Telehealth: Payer: Self-pay | Admitting: Family Medicine

## 2021-12-14 NOTE — Telephone Encounter (Signed)
FYI:Pt says she is feeling a little better, but it has settled in her chest.

## 2021-12-15 ENCOUNTER — Other Ambulatory Visit: Payer: Self-pay

## 2021-12-15 NOTE — Telephone Encounter (Signed)
Spoke with patient who states that she's feeling better she has an upcoming appointment so she will wait to see doctor if no improvement.

## 2021-12-17 ENCOUNTER — Other Ambulatory Visit: Payer: Self-pay

## 2021-12-17 ENCOUNTER — Ambulatory Visit (INDEPENDENT_AMBULATORY_CARE_PROVIDER_SITE_OTHER): Payer: BC Managed Care – PPO

## 2021-12-17 ENCOUNTER — Encounter: Payer: Self-pay | Admitting: Family Medicine

## 2021-12-17 ENCOUNTER — Ambulatory Visit (INDEPENDENT_AMBULATORY_CARE_PROVIDER_SITE_OTHER): Payer: BC Managed Care – PPO | Admitting: Family Medicine

## 2021-12-17 VITALS — BP 104/64 | HR 72 | Temp 97.5°F | Ht 65.0 in | Wt 134.8 lb

## 2021-12-17 DIAGNOSIS — R5383 Other fatigue: Secondary | ICD-10-CM

## 2021-12-17 DIAGNOSIS — Z0000 Encounter for general adult medical examination without abnormal findings: Secondary | ICD-10-CM | POA: Diagnosis not present

## 2021-12-17 DIAGNOSIS — J309 Allergic rhinitis, unspecified: Secondary | ICD-10-CM

## 2021-12-17 DIAGNOSIS — J22 Unspecified acute lower respiratory infection: Secondary | ICD-10-CM | POA: Insufficient documentation

## 2021-12-17 DIAGNOSIS — Z23 Encounter for immunization: Secondary | ICD-10-CM | POA: Diagnosis not present

## 2021-12-17 LAB — LIPID PANEL
Cholesterol: 231 mg/dL — ABNORMAL HIGH (ref 0–200)
HDL: 101.3 mg/dL (ref >39.00)
LDL Cholesterol: 118 mg/dL — ABNORMAL HIGH (ref 0–99)
NonHDL: 129.89
Total CHOL/HDL Ratio: 2
Triglycerides: 60 mg/dL (ref 0.0–149.0)
VLDL: 12 mg/dL (ref 0.0–40.0)

## 2021-12-17 LAB — COMPREHENSIVE METABOLIC PANEL
ALT: 13 U/L (ref 0–35)
AST: 19 U/L (ref 0–37)
Albumin: 4.2 g/dL (ref 3.5–5.2)
Alkaline Phosphatase: 50 U/L (ref 39–117)
BUN: 8 mg/dL (ref 6–23)
CO2: 28 mEq/L (ref 19–32)
Calcium: 8.8 mg/dL (ref 8.4–10.5)
Chloride: 102 mEq/L (ref 96–112)
Creatinine, Ser: 0.89 mg/dL (ref 0.40–1.20)
GFR: 69.23 mL/min (ref >60.00)
Glucose, Bld: 91 mg/dL (ref 70–99)
Potassium: 4 mEq/L (ref 3.5–5.1)
Sodium: 138 mEq/L (ref 135–145)
Total Bilirubin: 0.7 mg/dL (ref 0.2–1.2)
Total Protein: 6.3 g/dL (ref 6.0–8.3)

## 2021-12-17 LAB — CBC WITH DIFFERENTIAL/PLATELET
Basophils Absolute: 0 10*3/uL (ref 0.0–0.1)
Basophils Relative: 1.9 % (ref 0.0–3.0)
Eosinophils Absolute: 0.1 10*3/uL (ref 0.0–0.7)
Eosinophils Relative: 3.6 % (ref 0.0–5.0)
HCT: 38.6 % (ref 36.0–46.0)
Hemoglobin: 12.7 g/dL (ref 12.0–15.0)
Lymphocytes Relative: 44.9 % (ref 12.0–46.0)
Lymphs Abs: 1.2 10*3/uL (ref 0.7–4.0)
MCHC: 32.9 g/dL (ref 30.0–36.0)
MCV: 95.8 fl (ref 78.0–100.0)
Monocytes Absolute: 0.4 10*3/uL (ref 0.1–1.0)
Monocytes Relative: 14.6 % — ABNORMAL HIGH (ref 3.0–12.0)
Neutro Abs: 0.9 10*3/uL — ABNORMAL LOW (ref 1.4–7.7)
Neutrophils Relative %: 35 % — ABNORMAL LOW (ref 43.0–77.0)
Platelets: 185 10*3/uL (ref 150.0–400.0)
RBC: 4.03 Mil/uL (ref 3.87–5.11)
RDW: 13.2 % (ref 11.5–15.5)
WBC: 2.6 10*3/uL — ABNORMAL LOW (ref 4.0–10.5)

## 2021-12-17 LAB — URINALYSIS, ROUTINE W REFLEX MICROSCOPIC
Bilirubin Urine: NEGATIVE
Hgb urine dipstick: NEGATIVE
Ketones, ur: NEGATIVE
Leukocytes,Ua: NEGATIVE
Nitrite: NEGATIVE
Specific Gravity, Urine: 1.01 (ref 1.000–1.030)
Total Protein, Urine: NEGATIVE
Urine Glucose: NEGATIVE
Urobilinogen, UA: 0.2 (ref 0.0–1.0)
pH: 7 (ref 5.0–8.0)

## 2021-12-17 LAB — TSH: TSH: 2.25 u[IU]/mL (ref 0.35–5.50)

## 2021-12-17 LAB — HEMOGLOBIN A1C: Hgb A1c MFr Bld: 5.6 % (ref 4.6–6.5)

## 2021-12-17 MED ORDER — FLUTICASONE PROPIONATE 50 MCG/ACT NA SUSP: 2.0000 | Freq: Every day | NASAL | 6 refills | Status: DC

## 2021-12-17 MED ORDER — CLARITHROMYCIN ER 500 MG PO TB24: 1000.0000 mg | ORAL_TABLET | Freq: Every day | ORAL | 0 refills | Status: AC

## 2021-12-17 NOTE — Progress Notes (Signed)
? ? ? ?12/18/2021 ?Alyssa Cox ?[id number] ?01-Oct-1961 ? ? ?ASSESSMENT AND PLAN:  ? ?Chronic idiopathic constipation ?- Increase fiber/ water intake, decrease caffeine, increase activity level, can add  miralax  until BM soft.  ?Possible pelvic floor component.  ? ?History of colonic polyps ?Unable to see records but one from 2013, one page. ?Per patient has history of colon polyps, will schedule for colonoscopy as patient is due ?We have discussed the risks of bleeding, infection, perforation, medication reactions, and remote risk of death associated with colonoscopy. All questions were answered and the patient acknowledges these risk and wishes to proceed. ? ? ?Patient Care Team: ?Libby Maw, MD as PCP - General (Family Medicine) ? ?HISTORY OF PRESENT ILLNESS: ?61 y.o. female referred by Libby Maw,*, with a past medical history of status post total abdominal hysterectomy and others listed below presents for evaluation of screening colonoscopy. ? ?Moved our here from [place] to be near grandkids, has 2 grandkids.  ?Daughter is here and son is in [place].  ?Had colon 01/22/2012 which shows one polyps and another one 5 years ago, can not find information.  ? ?Patient does not complain of GERD.  ?Patient denies dysphagia, nausea, vomiting, melena.  ?Patient has a BM every other day. Sometimes with straining.   ?In July when first moved here some diarrhea with stress from mom passing and moving.  ?Has been given RX med in the past, but uses miralax as needed. If she uses metamucil she has bloating. Will do enema occasionally.   ?Patient denies hematochezia.  ?Denies changes in appetite, unintentional weight loss.  ?Patient denies  family history of colon cancer or other gastrointestinal malignancies. ? ?Current Medications:  ? ? ? ?Current Outpatient Medications (Respiratory):  ?  albuterol (VENTOLIN HFA) 108 (90 Base) MCG/ACT inhaler, Inhale 1-2 puffs into the lungs every 6 (six) hours as needed  for wheezing or shortness of breath. ?  cetirizine-pseudoephedrine (ZYRTEC-D) 5-120 MG tablet, Take 1 tablet by mouth 2 (two) times daily. ?  fluticasone (FLONASE) 50 MCG/ACT nasal spray, Place 2 sprays into both nostrils daily. ? ? ? ?Current Outpatient Medications (Other):  ?  clarithromycin (BIAXIN XL) 500 MG 24 hr tablet, Take 2 tablets (1,000 mg total) by mouth daily for 10 days. ?  estradiol (ESTRACE VAGINAL) 0.1 MG/GM vaginal cream, Place 2 g vaginally every other day. ?  latanoprost (XALATAN) 0.005 % ophthalmic solution, latanoprost 0.005 % eye drops  INSTILL 1 DROP INTO BOTH EYES AT BEDTIME ? ?Medical History:  ?Past Medical History:  ?Diagnosis Date  ? Allergic rhinitis   ? Endometriosis   ? ?Allergies: No Known Allergies  ? ?Surgical History:  ?She  has a past surgical history that includes Abdominal hysterectomy; Vascular surgery; Cesarean section; and Hemorrhoid surgery. ?Family History:  ?Her family history includes Anuerysm in her maternal grandmother; Asthma in her daughter and son; Hypertension in her brother, father, and mother; Ovarian cancer in her mother; Stroke in her father and maternal grandfather. ?Social History:  ? reports that she quit smoking about 8 years ago. Her smoking use included cigarettes. She has never used smokeless tobacco. She reports current alcohol use of about 2.0 standard drinks per week. She reports that she does not use drugs. ? ?REVIEW OF SYSTEMS  : All other systems reviewed and negative except where noted in the History of Present Illness. ? ? ?PHYSICAL EXAM: ?BP 106/70 (BP Location: Left Arm, Patient Position: Sitting, Cuff Size: Normal)   Pulse 84  Ht 5\' 4"  (1.626 m) Comment: Height measured without shoes  Wt 136 lb (61.7 kg)   BMI 23.34 kg/m?  ?General:   Pleasant, well developed female in no acute distress ?Head:  Normocephalic and atraumatic. ?Eyes: sclerae anicteric,conjunctive pink  ?Heart:  regular rate and rhythm ?Pulm: Clear anteriorly; no  wheezing ?Abdomen:  Soft, Obese AB, skin exam normal, Normal bowel sounds. mild tenderness in the LLQ. Without guarding and Without rebound, without hepatomegaly. ?Extremities:  Without edema. ?Msk:  Symmetrical without gross deformities. Peripheral pulses intact.  ?Neurologic:  Alert and  oriented x4;  grossly normal neurologically. ?Skin:   Dry and intact without significant lesions or rashes. ?Psychiatric: Demonstrates good judgement and reason without abnormal affect or behaviors. ? ? ?Vladimir Crofts, PA-C ?8:57 AM ? ? ?

## 2021-12-17 NOTE — Progress Notes (Signed)
? ?Established Patient Office Visit ? ?Subjective:  ?Patient ID: Alyssa Cox, female    DOB: Jul 11, 1961  Age: 61 y.o. MRN: [id number] ? ?CC:  ?Chief Complaint  ?Patient presents with  ? Annual Exam  ?  CPE, continued allergies. Patient fasting  ? ? ?HPI ?Alyssa Cox presents for a physical exam and fasting blood work.  Cough with wheezing and shortness of breath is improved but lingers.  She is experiencing shortness of breath upstairs associated with wheezing.  Cough is now productive of some purulent phlegm.  She has been taking Mucinex DM with some relief.  She believes it was lead to urinary frequency.  She has been afebrile.  She denies reflux symptoms.  She has been having nasal congestion runny nose and postnasal drip.  There has been fatigue.  She is due for her second Shingrix vaccine.  She had no issues first.  Consultation for colonoscopy tomorrow.  She has had her yearly mammogram. ? ?History reviewed. No pertinent past medical history. ? ?Past Surgical History:  ?Procedure Laterality Date  ? ABDOMINAL HYSTERECTOMY    ? CESAREAN SECTION    ? VASCULAR SURGERY    ? ? ?Family History  ?Problem Relation Age of Onset  ? Stroke Father   ? Breast cancer Neg Hx   ? ? ?Social History  ? ?Socioeconomic History  ? Marital status: Married  ?  Spouse name: Not on file  ? Number of children: Not on file  ? Years of education: Not on file  ? Highest education level: Not on file  ?Occupational History  ? Not on file  ?Tobacco Use  ? Smoking status: Former  ?  Types: Cigarettes  ?  Quit date: 10/03/2013  ?  Years since quitting: 8.2  ? Smokeless tobacco: Never  ?Vaping Use  ? Vaping Use: Never used  ?Substance and Sexual Activity  ? Alcohol use: Yes  ?  Alcohol/week: 2.0 standard drinks  ?  Types: 2 Glasses of wine per week  ?  Comment: 1-2 drinks a week  ? Drug use: Never  ? Sexual activity: Yes  ?  Birth control/protection: Surgical  ?Other Topics Concern  ? Not on file  ?Social History Narrative  ? Not on file   ? ?Social Determinants of Health  ? ?Financial Resource Strain: Not on file  ?Food Insecurity: Not on file  ?Transportation Needs: Not on file  ?Physical Activity: Not on file  ?Stress: Not on file  ?Social Connections: Not on file  ?Intimate Partner Violence: Not on file  ? ? ?Outpatient Medications Prior to Visit  ?Medication Sig Dispense Refill  ? albuterol (VENTOLIN HFA) 108 (90 Base) MCG/ACT inhaler Inhale 1-2 puffs into the lungs every 6 (six) hours as needed for wheezing or shortness of breath. 8 g 2  ? cetirizine-pseudoephedrine (ZYRTEC-D) 5-120 MG tablet Take 1 tablet by mouth 2 (two) times daily. 6 tablet   ? latanoprost (XALATAN) 0.005 % ophthalmic solution latanoprost 0.005 % eye drops ? INSTILL 1 DROP INTO BOTH EYES AT BEDTIME    ? fluticasone (FLONASE) 50 MCG/ACT nasal spray Place into both nostrils daily.    ? estradiol (ESTRACE VAGINAL) 0.1 MG/GM vaginal cream Place 2 g vaginally every other day. (Patient not taking: Reported on 12/17/2021) 42.5 g 5  ? ?No facility-administered medications prior to visit.  ? ? ?No Known Allergies ? ?ROS ?Review of Systems  ?Constitutional:  Negative for chills, diaphoresis, fatigue, fever and unexpected weight change.  ?HENT:  Positive for congestion, postnasal drip, rhinorrhea and sneezing. Negative for ear pain.   ?Eyes:  Negative for photophobia and visual disturbance.  ?Respiratory:  Positive for cough and wheezing.   ?Cardiovascular: Negative.   ?Gastrointestinal: Negative.   ?Genitourinary:  Positive for frequency. Negative for dysuria and urgency.  ?Musculoskeletal:  Negative for gait problem and joint swelling.  ?Neurological:  Negative for speech difficulty and weakness.  ? ?  ?Objective:  ?  ?Physical Exam ?Vitals and nursing note reviewed.  ?Constitutional:   ?   General: She is not in acute distress. ?   Appearance: Normal appearance. She is normal weight. She is not ill-appearing, toxic-appearing or diaphoretic.  ?HENT:  ?   Head: Normocephalic and  atraumatic.  ?   Right Ear: Tympanic membrane, ear canal and external ear normal.  ?   Left Ear: Tympanic membrane, ear canal and external ear normal.  ?   Mouth/Throat:  ?   Mouth: Mucous membranes are moist.  ?   Pharynx: Oropharynx is clear. No oropharyngeal exudate or posterior oropharyngeal erythema.  ?Eyes:  ?   General: No scleral icterus.    ?   Right eye: No discharge.     ?   Left eye: No discharge.  ?   Extraocular Movements: Extraocular movements intact.  ?   Conjunctiva/sclera: Conjunctivae normal.  ?   Pupils: Pupils are equal, round, and reactive to light.  ?Neck:  ?   Vascular: No carotid bruit.  ?Cardiovascular:  ?   Rate and Rhythm: Normal rate and regular rhythm.  ?Pulmonary:  ?   Effort: Pulmonary effort is normal. No respiratory distress.  ?   Breath sounds: Normal breath sounds. No wheezing, rhonchi or rales.  ?Abdominal:  ?   General: Bowel sounds are normal.  ?Musculoskeletal:  ?   Cervical back: No rigidity or tenderness.  ?Lymphadenopathy:  ?   Cervical: No cervical adenopathy.  ?Skin: ?   General: Skin is warm and dry.  ?Neurological:  ?   Mental Status: She is alert.  ?Psychiatric:     ?   Mood and Affect: Mood normal.     ?   Behavior: Behavior normal.  ? ? ?BP 104/64 (BP Location: Right Arm, Patient Position: Sitting, Cuff Size: Normal)   Pulse 72   Temp (!) 97.5 ?F (36.4 ?C) (Temporal)   Ht '5\' 5"'  (1.651 m)   Wt 134 lb 12.8 oz (61.1 kg)   SpO2 97%   BMI 22.43 kg/m?  ?Wt Readings from Last 3 Encounters:  ?12/17/21 134 lb 12.8 oz (61.1 kg)  ?12/04/21 138 lb 6.4 oz (62.8 kg)  ?11/26/21 136 lb 12.8 oz (62.1 kg)  ? ? ? ?Health Maintenance Due  ?Topic Date Due  ? HIV Screening  Never done  ? Hepatitis C Screening  Never done  ? ? ?There are no preventive care reminders to display for this patient. ? ?No results found for: TSH ?No results found for: WBC, HGB, HCT, MCV, PLT ?No results found for: NA, K, CHLORIDE, CO2, GLUCOSE, BUN, CREATININE, BILITOT, ALKPHOS, AST, ALT, PROT, ALBUMIN,  CALCIUM, ANIONGAP, EGFR, GFR ?No results found for: CHOL ?No results found for: HDL ?No results found for: [place] ?No results found for: TRIG ?No results found for: CHOLHDL ?No results found for: HGBA1C ? ?  ?Assessment & Plan:  ? ?Problem List Items Addressed This Visit   ? ?  ? Respiratory  ? Lower respiratory tract infection  ? Relevant Medications  ? clarithromycin (BIAXIN  XL) 500 MG 24 hr tablet  ? Other Relevant Orders  ? CBC with Differential/Platelet  ? DG Chest 2 View  ? Allergic rhinitis  ? Relevant Medications  ? fluticasone (FLONASE) 50 MCG/ACT nasal spray  ?  ? Other  ? Healthcare maintenance - Primary  ? Relevant Orders  ? Comprehensive metabolic panel  ? Hemoglobin A1c  ? Lipid panel  ? Need for shingles vaccine  ? Relevant Orders  ? Varicella-zoster vaccine IM (Shingrix) (Completed)  ? Other fatigue  ? Relevant Orders  ? Comprehensive metabolic panel  ? Hemoglobin A1c  ? TSH  ? ? ?Meds ordered this encounter  ?Medications  ? fluticasone (FLONASE) 50 MCG/ACT nasal spray  ?  Sig: Place 2 sprays into both nostrils daily.  ?  Dispense:  15.8 mL  ?  Refill:  6  ? clarithromycin (BIAXIN XL) 500 MG 24 hr tablet  ?  Sig: Take 2 tablets (1,000 mg total) by mouth daily for 10 days.  ?  Dispense:  20 tablet  ?  Refill:  0  ? ? ?Follow-up: Return in about 1 year (around 12/18/2022), or if symptoms worsen or fail to improve.  ?Information given on health maintenance and disease prevention.  Refilled Flonase for allergy rhinitis.  She will complete her Shingrix vaccination series today.  Biaxin today to treat lower respiratory tract infection taking advantage of its anti-inflammatory properties to help her breathing.  We will check a chest x-ray today.  Follow-up in a few weeks if cough has not resolved. ? ?Libby Maw, MD ?

## 2021-12-18 ENCOUNTER — Ambulatory Visit (INDEPENDENT_AMBULATORY_CARE_PROVIDER_SITE_OTHER): Payer: BC Managed Care – PPO | Admitting: Physician Assistant

## 2021-12-18 ENCOUNTER — Encounter: Payer: Self-pay | Admitting: Physician Assistant

## 2021-12-18 VITALS — BP 106/70 | HR 84 | Ht 64.0 in | Wt 136.0 lb

## 2021-12-18 DIAGNOSIS — Z86010 Personal history of colonic polyps: Secondary | ICD-10-CM

## 2021-12-18 DIAGNOSIS — K5904 Chronic idiopathic constipation: Secondary | ICD-10-CM | POA: Diagnosis not present

## 2021-12-18 MED ORDER — SUTAB 1479-225-188 MG PO TABS: 1.0000 | ORAL_TABLET | Freq: Once | ORAL | 0 refills | Status: AC

## 2021-12-18 NOTE — Patient Instructions (Addendum)
If you are age 63 or older, your body mass index should be between 23-30. Your Body mass index is 23.34 kg/m?Marland Kitchen If this is out of the aforementioned range listed, please consider follow up with your Primary Care Provider. ? ?If you are age 33 or younger, your body mass index should be between 19-25. Your Body mass index is 23.34 kg/m?Marland Kitchen If this is out of the aformentioned range listed, please consider follow up with your Primary Care Provider.  ? ?________________________________________________________ ? ?The [facility] providers would like to encourage you to use [facility] to communicate with providers for non-urgent requests or questions.  Due to long hold times on the telephone, sending your provider a message by [facility] may be a faster and more efficient way to get a response.  Please allow 48 business hours for a response.  Please remember that this is for non-urgent requests.  ?_______________________________________________________ ?You have been scheduled for a colonoscopy. Please follow written instructions given to you at your visit today.  ?Please pick up your prep supplies at the pharmacy within the next 1-3 days. ?If you use inhalers (even only as needed), please bring them with you on the day of your procedure. ? ?Miralax is an osmotic laxative.  ?It only brings more water into the stool.  ?This is safe to take daily.  ?Can take up to 17 gram of miralax twice a day.  ?Mix with juice or coffee.  ?Start 1 capful at night for 3-4 days and reassess your response in 3-4 days.  ?You can increase and decrease the dose based on your response.  ?Remember, it can take up to 3-4 days to take effect OR for the effects to wear off.  ? ?I often pair this with benefiber in the morning to help assure the stool is not too loose.  ? ?Get squatty potty or get your knees above hips while having BM.  ? ?Toileting tips to help with your constipation ?- Drink at least 64-80 ounces of water/liquid per day. ?- Establish a time to  try to move your bowels every day.  For many people, this is after a cup of coffee or after a meal such as breakfast. ?- Sit all of the way back on the toilet keeping your back fairly straight and while sitting up, try to rest the tops of your forearms on your upper thighs.   ?- Raising your feet with a step stool/squatty potty can be helpful to improve the angle that allows your stool to pass through the rectum. ?- Relax the rectum feeling it bulge toward the toilet water.  If you feel your rectum raising toward your body, you are contracting rather than relaxing. ?- Breathe in and slowly exhale. "Belly breath" by expanding your belly towards your belly button. Keep belly expanded as you gently direct pressure down and back to the anus.  A low pitched GRRR sound can assist with increasing intra-abdominal pressure.  ?- Repeat 3-4 times. If unsuccessful, contract the pelvic floor to restore normal tone and get off the toilet.  Avoid excessive straining. ?- To reduce excessive wiping by teaching your anus to normally contract, place hands on outer aspect of knees and resist knee movement outward.  Hold 5-10 second then place hands just inside of knees and resist inward movement of knees.  Hold 5 seconds.  Repeat a few times each way. ? ?

## 2021-12-23 ENCOUNTER — Ambulatory Visit: Payer: BC Managed Care – PPO

## 2021-12-31 ENCOUNTER — Ambulatory Visit: Payer: BC Managed Care – PPO

## 2022-01-12 ENCOUNTER — Telehealth: Payer: Self-pay | Admitting: Family Medicine

## 2022-01-12 NOTE — Progress Notes (Signed)
Reviewed and agree with documentation and assessment and plan. K. Veena  , MD   

## 2022-01-12 NOTE — Telephone Encounter (Signed)
Pt is enquiring about a referral to dermatology. She stated it had been about 3 weeks. I did not see a new referral so She would like for that to be made. ?

## 2022-01-14 ENCOUNTER — Ambulatory Visit (INDEPENDENT_AMBULATORY_CARE_PROVIDER_SITE_OTHER): Payer: BC Managed Care – PPO | Admitting: General Practice

## 2022-01-14 VITALS — BP 125/83 | HR 83 | Ht 64.0 in | Wt 137.0 lb

## 2022-01-14 DIAGNOSIS — N898 Other specified noninflammatory disorders of vagina: Secondary | ICD-10-CM

## 2022-01-14 MED ORDER — FLUCONAZOLE 150 MG PO TABS: 150.0000 mg | ORAL_TABLET | ORAL | 0 refills | Status: DC

## 2022-01-14 NOTE — Progress Notes (Signed)
Patient reports vaginal itching/burning, "feeling raw" x 2 weeks. She states she recently took two antibiotics as well and finished them about 2 weeks ago. She denies abnormal discharge or odor. Diflucan x 2 sent to pharmacy. Patient will follow up as needed for care. ? ?Koren Bound RN BSN ?01/14/22 ? ?

## 2022-01-15 ENCOUNTER — Other Ambulatory Visit: Payer: Self-pay

## 2022-01-15 DIAGNOSIS — L853 Xerosis cutis: Secondary | ICD-10-CM

## 2022-01-15 NOTE — Telephone Encounter (Signed)
New referral placed per referrals they seen where order was canceled.  ?

## 2022-02-02 ENCOUNTER — Telehealth: Payer: Self-pay | Admitting: Gastroenterology

## 2022-02-02 NOTE — Telephone Encounter (Signed)
Patient called requesting to speak with nurse. Patient is scheduled  for colonoscopy 02/11/22. Per patient, currently has hemorrhoids, and is wondering if that will affect her colonoscopy. Please advise.  ?

## 2022-02-02 NOTE — Telephone Encounter (Signed)
Returned call to patient. I informed her that she can still have her colonoscopy if she has hemorrhoids. I told her that if her hemorrhoids are already irritated then the bowel prep may cause more irritation because she will be having frequent BM's. Pt knows that internal hemorrhoids can be banded and external hemorrhoids would require a surgical referral.  Pt states that she has been using hemorrhoid cream, she will continue to monitor and will call back early next week if her symptoms have not improved. Pt has been advised to use wet wipes instead of toilet paper and to try sitz baths throughout the day. Pt verbalized understanding and had no concerns at the end of the call. ?

## 2022-02-04 ENCOUNTER — Encounter: Payer: Self-pay | Admitting: Gastroenterology

## 2022-02-08 ENCOUNTER — Ambulatory Visit: Payer: BC Managed Care – PPO | Admitting: Obstetrics and Gynecology

## 2022-02-10 ENCOUNTER — Telehealth: Payer: Self-pay

## 2022-02-10 NOTE — Telephone Encounter (Signed)
Patient called in stating she had thrown up a small amount after drinking coffee, and wanted to make sure it wouldn't interfere with her prep. Patient informed to continue prep later today, and tomorrow morning as long as she could hold the prep down. No further questions.  ?

## 2022-02-11 ENCOUNTER — Other Ambulatory Visit: Payer: Self-pay

## 2022-02-11 ENCOUNTER — Encounter: Payer: Self-pay | Admitting: Gastroenterology

## 2022-02-11 ENCOUNTER — Ambulatory Visit (AMBULATORY_SURGERY_CENTER): Payer: BC Managed Care – PPO | Admitting: Gastroenterology

## 2022-02-11 VITALS — BP 132/75 | HR 63 | Temp 97.5°F | Resp 9 | Ht 64.0 in | Wt 136.0 lb

## 2022-02-11 DIAGNOSIS — Z86010 Personal history of colonic polyps: Secondary | ICD-10-CM | POA: Diagnosis not present

## 2022-02-11 DIAGNOSIS — K635 Polyp of colon: Secondary | ICD-10-CM | POA: Diagnosis not present

## 2022-02-11 DIAGNOSIS — N898 Other specified noninflammatory disorders of vagina: Secondary | ICD-10-CM

## 2022-02-11 DIAGNOSIS — D122 Benign neoplasm of ascending colon: Secondary | ICD-10-CM

## 2022-02-11 DIAGNOSIS — K5904 Chronic idiopathic constipation: Secondary | ICD-10-CM

## 2022-02-11 MED ORDER — SODIUM CHLORIDE 0.9 % IV SOLN: 500.0000 mL | INTRAVENOUS | Status: DC

## 2022-02-11 MED ORDER — FLUCONAZOLE 150 MG PO TABS: 150.0000 mg | ORAL_TABLET | ORAL | 0 refills | Status: DC

## 2022-02-11 NOTE — Progress Notes (Signed)
[facility] History and Physical ? ? ?Primary Care Physician:  Libby Maw, MD ? ? ?Reason for Procedure:  History of adenomatous colon polyps ? ?Plan:    Surveillance colonoscopy with possible interventions as needed ? ? ? ? ?HPI: Alyssa Cox is a very pleasant 61 y.o. female here for surveillance colonoscopy. ?Denies any nausea, vomiting, abdominal pain, melena or bright red blood per rectum ? ?The risks and benefits as well as alternatives of endoscopic procedure(s) have been discussed and reviewed. All questions answered. The patient agrees to proceed. ? ? ? ?Past Medical History:  ?Diagnosis Date  ? Allergic rhinitis   ? Endometriosis   ? ? ?Past Surgical History:  ?Procedure Laterality Date  ? ABDOMINAL HYSTERECTOMY    ? CESAREAN SECTION    ? HEMORRHOID SURGERY    ? VASCULAR SURGERY    ? ? ?Prior to Admission medications   ?Medication Sig Start Date End Date Taking? Authorizing Provider  ?Biotin w/ Vitamins C & E (HAIR/SKIN/NAILS PO) Take by mouth.   Yes [provider]  ?cetirizine-pseudoephedrine (ZYRTEC-D) 5-120 MG tablet Take 1 tablet by mouth 2 (two) times daily. 12/04/21  Yes Nche, Charlene Brooke, NP  ?estradiol (ESTRACE VAGINAL) 0.1 MG/GM vaginal cream Place 2 g vaginally every other day. 11/19/21  Yes Chancy Milroy, MD  ?fluticasone (FLONASE) 50 MCG/ACT nasal spray Place 2 sprays into both nostrils daily. 12/17/21  Yes Libby Maw, MD  ?latanoprost (XALATAN) 0.005 % ophthalmic solution latanoprost 0.005 % eye drops ? INSTILL 1 DROP INTO BOTH EYES AT BEDTIME   Yes [provider]  ?Multiple Vitamins-Minerals (MULTIVITAMIN WOMEN 50+ PO) Take by mouth.   Yes [provider]  ?albuterol (VENTOLIN HFA) 108 (90 Base) MCG/ACT inhaler Inhale 1-2 puffs into the lungs every 6 (six) hours as needed for wheezing or shortness of breath. 11/26/21   Libby Maw, MD  ?fluconazole (DIFLUCAN) 150 MG tablet Take 1 tablet (150 mg total) by mouth every  3 (three) days. 01/14/22   Donnamae Jude, MD  ? ? ?Current Outpatient Medications  ?Medication Sig Dispense Refill  ? Biotin w/ Vitamins C & E (HAIR/SKIN/NAILS PO) Take by mouth.    ? cetirizine-pseudoephedrine (ZYRTEC-D) 5-120 MG tablet Take 1 tablet by mouth 2 (two) times daily. 6 tablet   ? estradiol (ESTRACE VAGINAL) 0.1 MG/GM vaginal cream Place 2 g vaginally every other day. 42.5 g 5  ? fluticasone (FLONASE) 50 MCG/ACT nasal spray Place 2 sprays into both nostrils daily. 15.8 mL 6  ? latanoprost (XALATAN) 0.005 % ophthalmic solution latanoprost 0.005 % eye drops ? INSTILL 1 DROP INTO BOTH EYES AT BEDTIME    ? Multiple Vitamins-Minerals (MULTIVITAMIN WOMEN 50+ PO) Take by mouth.    ? albuterol (VENTOLIN HFA) 108 (90 Base) MCG/ACT inhaler Inhale 1-2 puffs into the lungs every 6 (six) hours as needed for wheezing or shortness of breath. 8 g 2  ? fluconazole (DIFLUCAN) 150 MG tablet Take 1 tablet (150 mg total) by mouth every 3 (three) days. 2 tablet 0  ? ?Current Facility-Administered Medications  ?Medication Dose Route Frequency Provider Last Rate Last Admin  ? 0.9 %  sodium chloride infusion  500 mL Intravenous Continuous , Venia Minks, MD      ? ? ?Allergies as of 02/11/2022 - Review Complete 02/11/2022  ?Allergen Reaction Noted  ? Morphine Itching 02/11/2022  ? ? ?Family History  ?Problem Relation Age of Onset  ? Ovarian cancer Mother   ?  mets to eye  ? Hypertension Mother   ? Stroke Father   ? Hypertension Father   ? Hypertension Brother   ? Anuerysm Maternal Grandmother   ? Colon cancer Maternal Grandfather   ? Stroke Maternal Grandfather   ? Asthma Daughter   ? Asthma Son   ? Breast cancer Neg Hx   ? Esophageal cancer Neg Hx   ? Rectal cancer Neg Hx   ? Stomach cancer Neg Hx   ? ? ?Social History  ? ?Socioeconomic History  ? Marital status: Married  ?  Spouse name: Not on file  ? Number of children: 2  ? Years of education: Not on file  ? Highest education level: Not on file  ?Occupational  History  ? Not on file  ?Tobacco Use  ? Smoking status: Former  ?  Types: Cigarettes  ?  Quit date: 10/03/2013  ?  Years since quitting: 8.3  ? Smokeless tobacco: Never  ?Vaping Use  ? Vaping Use: Never used  ?Substance and Sexual Activity  ? Alcohol use: Yes  ?  Alcohol/week: 2.0 standard drinks  ?  Types: 2 Glasses of wine per week  ?  Comment: 1-2 drinks a week  ? Drug use: Never  ? Sexual activity: Yes  ?  Birth control/protection: Surgical  ?Other Topics Concern  ? Not on file  ?Social History Narrative  ? Not on file  ? ?Social Determinants of Health  ? ?Financial Resource Strain: Not on file  ?Food Insecurity: Not on file  ?Transportation Needs: Not on file  ?Physical Activity: Not on file  ?Stress: Not on file  ?Social Connections: Not on file  ?Intimate Partner Violence: Not on file  ? ? ?Review of Systems: ? ?All other review of systems negative except as mentioned in the HPI. ? ?Physical Exam: ?Vital signs in last 24 hours: ?BP (!) 131/103   Pulse 67   Temp (!) 97.5 ?F (36.4 ?C)   Ht '5\' 4"'$  (1.626 m)   Wt 136 lb (61.7 kg)   SpO2 100%   BMI 23.34 kg/m?  ?General:   Alert, NAD ?Lungs:  Clear .   ?Heart:  Regular rate and rhythm ?Abdomen:  Soft, nontender and nondistended. ?Neuro/Psych:  Alert and cooperative. Normal mood and affect. A and O x 3 ? ?Reviewed labs, radiology imaging, old records and pertinent past GI work up ? ?Patient is appropriate for planned procedure(s) and anesthesia in an ambulatory setting ? ? ?K. Denzil Magnuson , MD ?[phone]  ? ? ?  ?

## 2022-02-11 NOTE — Op Note (Signed)
[facility] ?Patient Name: Alyssa Cox ?Procedure Date: 02/11/2022 9:07 AM ?MRN: [id number] ?Endoscopist: Mauri Pole , MD ?Age: 61 ?Referring MD:  ?Date of Birth: 1961/04/22 ?Gender: Female ?Account #: [id number] ?Procedure:                Colonoscopy ?Indications:              High risk colon cancer surveillance: Personal  ?                          history of colonic polyps, High risk colon cancer  ?                          surveillance: Personal history of adenoma less than  ?                          10 mm in size ?Medicines:                Monitored Anesthesia Care ?Procedure:                Pre-Anesthesia Assessment: ?                          - Prior to the procedure, a History and Physical  ?                          was performed, and patient medications and  ?                          allergies were reviewed. The patient's tolerance of  ?                          previous anesthesia was also reviewed. The risks  ?                          and benefits of the procedure and the sedation  ?                          options and risks were discussed with the patient.  ?                          All questions were answered, and informed consent  ?                          was obtained. Prior Anticoagulants: The patient has  ?                          taken no previous anticoagulant or antiplatelet  ?                          agents. ASA Grade Assessment: II - A patient with  ?                          mild systemic disease. After reviewing the risks  ?  and benefits, the patient was deemed in  ?                          satisfactory condition to undergo the procedure. ?                          After obtaining informed consent, the colonoscope  ?                          was passed under direct vision. Throughout the  ?                          procedure, the patient's blood pressure, pulse, and  ?                          oxygen saturations were monitored  continuously. The  ?                          Olympus [id number]-[id number] (#[id number]) Colonoscope was  ?                          introduced through the anus and advanced to the the  ?                          cecum, identified by appendiceal orifice and  ?                          ileocecal valve. The colonoscopy was performed  ?                          without difficulty. The patient tolerated the  ?                          procedure well. The quality of the bowel  ?                          preparation was excellent. The ileocecal valve,  ?                          appendiceal orifice, and rectum were photographed. ?Scope In: 9:18:15 AM ?Scope Out: 9:35:36 AM ?Scope Withdrawal Time: 0 hours 12 minutes 22 seconds  ?Total Procedure Duration: 0 hours 17 minutes 21 seconds  ?Findings:                 The perianal and digital rectal examinations were  ?                          normal. ?                          A 15 mm polyp was found in the ascending colon. The  ?                          polyp was granular lateral spreading. The polyp was  ?  removed with a cold snare. Resection and retrieval  ?                          were complete. ?                          Non-bleeding external and internal hemorrhoids were  ?                          found during retroflexion. The hemorrhoids were  ?                          small. ?                          The exam was otherwise without abnormality. ?Complications:            No immediate complications. ?Estimated Blood Loss:     Estimated blood loss was minimal. ?Impression:               - One 15 mm polyp in the ascending colon, removed  ?                          with a cold snare. Resected and retrieved. ?                          - Non-bleeding external and internal hemorrhoids. ?                          - The examination was otherwise normal. ?Recommendation:           - Patient has a contact number available for  ?                           emergencies. The signs and symptoms of potential  ?                          delayed complications were discussed with the  ?                          patient. Return to normal activities tomorrow.  ?                          Written discharge instructions were provided to the  ?                          patient. ?                          - Resume previous diet. ?                          - Continue present medications. ?                          - Await pathology results. ?                          -  Repeat colonoscopy in 3 - 5 years for  ?                          surveillance based on pathology results. ?Mauri Pole, MD ?02/11/2022 9:46:01 AM ?This report has been signed electronically. ?

## 2022-02-11 NOTE — Progress Notes (Signed)
Called to room to assist during endoscopic procedure.  Patient ID and intended procedure confirmed with present staff. Received instructions for my participation in the procedure from the performing physician.  

## 2022-02-11 NOTE — Progress Notes (Signed)
Pt called nurse voice mail requesting Rx Diflucan. Call placed to pt. Spoke with pt. Pt states having hx of vaginal itching and yeast infections. Pt states had family visit and became sick and took antibiotics. Pt only having vaginal discharge that is itching for last 4 days. Denies odor or any other vaginal symptoms.  ?Pt had last yeast infection on 3/30 and was suppose to see Dr Rip Harbour this week but was changed due to schedule change in office.  ?Sent in Rx Diflucan x 2 to pharmacy on file.  ?,RN  ?

## 2022-02-11 NOTE — Progress Notes (Deleted)
Pt's states no medical or surgical changes since previsit or office visit. 

## 2022-02-11 NOTE — Patient Instructions (Signed)
Information on polyps and hemorrhoids given to you today. ? ?Await pathology results. ? ?Resume previous diet and medications. ? ? ?YOU HAD AN ENDOSCOPIC PROCEDURE TODAY AT [facility]:   Refer to the procedure report that was given to you for any specific questions about what was found during the examination.  If the procedure report does not answer your questions, please call your gastroenterologist to clarify.  If you requested that your care partner not be given the details of your procedure findings, then the procedure report has been included in a sealed envelope for you to review at your convenience later. ? ?YOU SHOULD EXPECT: Some feelings of bloating in the abdomen. Passage of more gas than usual.  Walking can help get rid of the air that was put into your GI tract during the procedure and reduce the bloating. If you had a lower endoscopy (such as a colonoscopy or flexible sigmoidoscopy) you may notice spotting of blood in your stool or on the toilet paper. If you underwent a bowel prep for your procedure, you may not have a normal bowel movement for a few days. ? ?Please Note:  You might notice some irritation and congestion in your nose or some drainage.  This is from the oxygen used during your procedure.  There is no need for concern and it should clear up in a day or so. ? ?SYMPTOMS TO REPORT IMMEDIATELY: ? ?Following lower endoscopy (colonoscopy or flexible sigmoidoscopy): ? Excessive amounts of blood in the stool ? Significant tenderness or worsening of abdominal pains ? Swelling of the abdomen that is new, acute ? Fever of 100?F or higher ? ? ?For urgent or emergent issues, a gastroenterologist can be reached at any hour by calling [phone]. ?Do not use MyChart messaging for urgent concerns.  ? ? ?DIET:  We do recommend a small meal at first, but then you may proceed to your regular diet.  Drink plenty of fluids but you should avoid alcoholic beverages for 24  hours. ? ?ACTIVITY:  You should plan to take it easy for the rest of today and you should NOT DRIVE or use heavy machinery until tomorrow (because of the sedation medicines used during the test).   ? ?FOLLOW UP: ?Our staff will call the number listed on your records 48-72 hours following your procedure to check on you and address any questions or concerns that you may have regarding the information given to you following your procedure. If we do not reach you, we will leave a message.  We will attempt to reach you two times.  During this call, we will ask if you have developed any symptoms of COVID 19. If you develop any symptoms (ie: fever, flu-like symptoms, shortness of breath, cough etc.) before then, please call [phone].  If you test positive for Covid 19 in the 2 weeks post procedure, please call and report this information to [place].   ? ?If any biopsies were taken you will be contacted by phone or by letter within the next 1-3 weeks.  Please call us at [phone] if you have not heard about the biopsies in 3 weeks.  ? ? ?SIGNATURES/CONFIDENTIALITY: ?You and/or your care partner have signed paperwork which will be entered into your electronic medical record.  These signatures attest to the fact that that the information above on your After Visit Summary has been reviewed and is understood.  Full responsibility of the confidentiality of this discharge information lies with you and/or  your care-partner.  ?

## 2022-02-11 NOTE — Progress Notes (Signed)
Report to PACU, RN, vss, BBS= Clear.  

## 2022-02-15 ENCOUNTER — Telehealth: Payer: Self-pay

## 2022-02-15 NOTE — Telephone Encounter (Signed)
?  Follow up Call- ? ? ?  02/11/2022  ?  7:59 AM  ?Call back number  ?Post procedure Call Back phone  # [phone]  ?Permission to leave phone message Yes  ?  ? ?Patient questions: ? ?Do you have a fever, pain , or abdominal swelling? No. ?Pain Score  0 * ? ?Have you tolerated food without any problems? Yes.   ? ?Have you been able to return to your normal activities? Yes.   ? ?Do you have any questions about your discharge instructions: ?Diet   No. ?Medications  No. ?Follow up visit  No. ? ?Do you have questions or concerns about your Care? No. ? ?Actions: ?* If pain score is 4 or above: ?No action needed, pain <4. ? ? ?

## 2022-02-25 ENCOUNTER — Ambulatory Visit (INDEPENDENT_AMBULATORY_CARE_PROVIDER_SITE_OTHER): Payer: BC Managed Care – PPO | Admitting: Nurse Practitioner

## 2022-02-25 ENCOUNTER — Encounter: Payer: Self-pay | Admitting: Nurse Practitioner

## 2022-02-25 VITALS — BP 118/74 | HR 74 | Temp 97.7°F | Ht 64.0 in | Wt 135.0 lb

## 2022-02-25 DIAGNOSIS — S70361A Insect bite (nonvenomous), right thigh, initial encounter: Secondary | ICD-10-CM

## 2022-02-25 DIAGNOSIS — N898 Other specified noninflammatory disorders of vagina: Secondary | ICD-10-CM | POA: Diagnosis not present

## 2022-02-25 DIAGNOSIS — W57XXXA Bitten or stung by nonvenomous insect and other nonvenomous arthropods, initial encounter: Secondary | ICD-10-CM | POA: Diagnosis not present

## 2022-02-25 MED ORDER — FLUCONAZOLE 150 MG PO TABS: 150.0000 mg | ORAL_TABLET | ORAL | 0 refills | Status: DC

## 2022-02-25 MED ORDER — DOXYCYCLINE HYCLATE 100 MG PO TABS: 100.0000 mg | ORAL_TABLET | Freq: Two times a day (BID) | ORAL | 0 refills | Status: DC

## 2022-02-25 NOTE — Progress Notes (Signed)
? ?Acute Office Visit ? ?Subjective:  ? ?  ?Patient ID: Alyssa Cox, female    DOB: [birth year], 61 y.o.   MRN: [id number] ? ?Chief Complaint  ?Patient presents with  ? Insect Bite  ?  Tick bite 2 days becoming larger in size and warm to touch very itchy.   ? ? ?HPI ?Patient is in today for tick bite on Monday. She states that her husband pulled 2 ticks off of her, one on her shoulder and one on her right hip. She states the one on her shoulder is fine, but there are 2 spots on her right hip that are getting bigger, red, warm to touch and itchy. She has used neosporin, hydrogen peroxide, and rubbing alcohol on the spots. She denies fevers and joint aches.  ? ?ROS ?See pertinent positives and negatives per HPI. ? ?   ?Objective:  ?  ?BP 118/74 (BP Location: Right Arm, Patient Position: Sitting, Cuff Size: Normal)   Pulse 74   Temp 97.7 ?F (36.5 ?C) (Temporal)   Ht '5\' 4"'$  (1.626 m)   Wt 135 lb (61.2 kg)   SpO2 99%   BMI 23.17 kg/m?  ? ? ?Physical Exam ?Vitals and nursing note reviewed.  ?Constitutional:   ?   General: She is not in acute distress. ?   Appearance: Normal appearance.  ?HENT:  ?   Head: Normocephalic.  ?Eyes:  ?   Conjunctiva/sclera: Conjunctivae normal.  ?Pulmonary:  ?   Effort: Pulmonary effort is normal.  ?Musculoskeletal:  ?   Cervical back: Normal range of motion.  ?Skin: ?   General: Skin is warm.  ?   Comments: Skin on shoulder normal. Two circular areas or erythema to right hip, approximately 3cm in diameter.   ?Neurological:  ?   General: No focal deficit present.  ?   Mental Status: She is alert and oriented to person, place, and time.  ?Psychiatric:     ?   Mood and Affect: Mood normal.     ?   Behavior: Behavior normal.     ?   Thought Content: Thought content normal.     ?   Judgment: Judgment normal.  ? ? ?No results found for any visits on 02/25/22. ?   ?Assessment & Plan:  ? ?Problem List Items Addressed This Visit   ?None ?Visit Diagnoses   ? ? Tick bite of right thigh, initial  encounter    -  Primary  ? 2 attached ticks removed by patient on Monday. With redness and warmth will treat with doxcycline BID x10 days. Check RMSF, lyme. Can use cortisone prn itch  ? Relevant Orders  ? Rocky mtn spotted fvr abs pnl(IgG+IgM)  ? B. burgdorfi antibodies  ? Vaginal itching      ? Will give diflucan to take after completing the antibiotics due to recent yeast infection with antibiotics.   ? Relevant Medications  ? fluconazole (DIFLUCAN) 150 MG tablet  ? ?  ? ? ?Meds ordered this encounter  ?Medications  ? doxycycline (VIBRA-TABS) 100 MG tablet  ?  Sig: Take 1 tablet (100 mg total) by mouth 2 (two) times daily.  ?  Dispense:  20 tablet  ?  Refill:  0  ? fluconazole (DIFLUCAN) 150 MG tablet  ?  Sig: Take 1 tablet (150 mg total) by mouth every 3 (three) days.  ?  Dispense:  2 tablet  ?  Refill:  0  ? ? ?Return if symptoms worsen or fail  to improve. ? ?Charyl Dancer, NP ? ? ?

## 2022-02-25 NOTE — Patient Instructions (Signed)
It was great to see you! ? ?Start doxycyline twice a day for 10 days. Make sure you wear sunscreen outside. I have also sent in the diflucan for yeast.  ? ?Let's follow-up if your symptoms worsen or don't improve  ? ?Take care, ? ?Vance Peper, NP ? ?

## 2022-02-26 ENCOUNTER — Encounter: Payer: Self-pay | Admitting: Gastroenterology

## 2022-03-01 LAB — B. BURGDORFI ANTIBODIES: B burgdorferi Ab IgG+IgM: <0.9 index

## 2022-03-01 LAB — ROCKY MTN SPOTTED FVR ABS PNL(IGG+IGM)
RMSF IgG: NOT DETECTED
RMSF IgM: NOT DETECTED

## 2022-03-02 ENCOUNTER — Ambulatory Visit (INDEPENDENT_AMBULATORY_CARE_PROVIDER_SITE_OTHER): Payer: BC Managed Care – PPO | Admitting: Gastroenterology

## 2022-03-02 ENCOUNTER — Encounter: Payer: Self-pay | Admitting: Gastroenterology

## 2022-03-02 VITALS — BP 102/60 | HR 67 | Ht 64.0 in | Wt 134.0 lb

## 2022-03-02 DIAGNOSIS — K602 Anal fissure, unspecified: Secondary | ICD-10-CM | POA: Diagnosis not present

## 2022-03-02 DIAGNOSIS — K5902 Outlet dysfunction constipation: Secondary | ICD-10-CM

## 2022-03-02 DIAGNOSIS — R198 Other specified symptoms and signs involving the digestive system and abdomen: Secondary | ICD-10-CM | POA: Diagnosis not present

## 2022-03-02 DIAGNOSIS — K625 Hemorrhage of anus and rectum: Secondary | ICD-10-CM | POA: Diagnosis not present

## 2022-03-02 MED ORDER — AMBULATORY NON FORMULARY MEDICATION: 0 refills | Status: DC

## 2022-03-02 NOTE — Patient Instructions (Addendum)
We have sent a prescription for nitroglycerin 0.125% gel to [facility]. You should apply a pea size amount to your rectum three times daily x 6-8 weeks. ? ?[facility]'s information is below: ?Address: [address], [place], [place] [address]  ?[phone] ? ?*Please DO NOT go directly from our office to pick up this medication! Give the pharmacy 1 day to process the prescription as this is compounded and takes time to make.  ? ? ?Take Miralax 1 capful twice a day  ? ?Take Benfiber 1 tablespoon twice a day  ? ?Anal Fissure, Adult ? ?An anal fissure is a small tear or crack in the tissue of the anus. Bleeding from a fissure usually stops on its own within a few minutes. However, bleeding will often occur again with each bowel movement until the fissure heals. ?What are the causes? ?This condition is usually caused by passing a large or hard stool (feces). Other causes include: ?Constipation. ?Frequent diarrhea. ?Inflammatory bowel disease (Crohn's disease or ulcerative colitis). ?Childbirth. ?Infections. ?Anal sex. ?What are the signs or symptoms? ?Symptoms of this condition include: ?Bleeding from the rectum. ?Small amounts of blood seen on your stool, on the toilet paper, or in the toilet after a bowel movement. The blood coats the outside of the stool and is not mixed with the stool. ?Painful bowel movements. ?Itching or irritation around the anus. ?How is this diagnosed? ?A health care provider may diagnose this condition by closely examining the anal area. An anal fissure can usually be seen with careful inspection. In some cases, a rectal exam may be performed, or a short tube (anoscope) may be used to examine the anal canal. ?How is this treated? ?Initial treatment for this condition may include: ?Taking steps to avoid constipation. This may include making changes to your diet, such as increasing your intake of fiber or fluid. ?Taking fiber supplements. These supplements can soften  your stool to help make bowel movements easier. Your health care provider may also prescribe a stool softener if your stool is hard. ?Taking sitz baths. This may help to heal the tear. ?Using medicated creams or ointments. These may be prescribed to lessen discomfort. ?Treatments that are sometimes used if initial treatments do not work well or if the condition is more severe may include: ?Botulinum injection. ?Surgery to repair the fissure. ?Follow these instructions at home: ?Eating and drinking ? ?Avoid foods that may cause constipation, such as bananas, milk, and other dairy products. ?Eat all fruits, except bananas. ?Drink enough fluid to keep your urine pale yellow. ?Eat foods that are high in fiber, such as beans, whole grains, and fresh fruits and vegetables. ?General instructions ? ?Take over-the-counter and prescription medicines only as told by your health care provider. ?Use creams or ointments only as told by your health care provider. ?Keep the anal area clean and dry. ?Take sitz baths as told by your health care provider. Do not use soap in the sitz baths. ?Keep all follow-up visits as told by your health care provider. This is important. ?Contact a health care provider if you have: ?More bleeding. ?A fever. ?Diarrhea that is mixed with blood. ?Pain that continues. ?Ongoing problems that are getting worse rather than better. ?Summary ?An anal fissure is a small tear or crack in the tissue of the anus. This condition is usually caused by passing a large or hard stool (feces). Other causes include constipation and frequent diarrhea. ?Initial treatment for this condition may include taking steps  to avoid constipation, such as increasing your intake of fiber or fluid. ?Follow instructions for care as told by your health care provider. ?Contact your health care provider if you have more bleeding or your problem is getting worse rather than better. ?Keep all follow-up visits as told by your health care  provider. This is important. ?This information is not intended to replace advice given to you by your health care provider. Make sure you discuss any questions you have with your health care provider. ?Document Revised: 04/30/2021 Document Reviewed: 04/30/2021 ?Elsevier Patient Education ? [place]. ? ? ? ? ? ?I appreciate the  opportunity to care for you ? ?Thank You  ? ?Harl Bowie , MD  ?

## 2022-03-02 NOTE — Progress Notes (Signed)
PROCEDURE NOTE: ?The patient presents with symptomatic grade II  hemorrhoids, requesting rubber band ligation of his/her hemorrhoidal disease.  All risks, benefits and alternative forms of therapy were described and informed consent was obtained. ? ?In the Left Lateral Decubitus position rectal examination was performed, patient has severe rectal tenderness, left lateral anal fissure.  She did not tolerate anoscopic exam  ?The decision was made to not proceed with hemorrhoidal band ligation  ? ? The patient was discharged home without pain or other issues.  Dietary and behavioral recommendations were given and along with follow-up instructions.   ?  ?The following  treatments were recommended: ? ?MiraLAX 1 capful twice daily ?Benefiber 1 tablespoon twice daily with meals ?Avoid rectal enema ?Use nitroglycerin 0.125% 3 times daily per rectum for 6 to 8 weeks ?Refer to pelvic floor physical therapy for biofeedback for dyssynergic defecation ?Return for follow-up office visit in 2 months ? ?K. Denzil Magnuson , MD ?[phone]  ? ? ?

## 2022-03-11 ENCOUNTER — Encounter: Payer: BC Managed Care – PPO | Attending: Gastroenterology | Admitting: Physical Therapy

## 2022-03-11 ENCOUNTER — Encounter: Payer: Self-pay | Admitting: Physical Therapy

## 2022-03-11 DIAGNOSIS — K5902 Outlet dysfunction constipation: Secondary | ICD-10-CM | POA: Diagnosis not present

## 2022-03-11 DIAGNOSIS — R198 Other specified symptoms and signs involving the digestive system and abdomen: Secondary | ICD-10-CM | POA: Diagnosis not present

## 2022-03-11 DIAGNOSIS — K625 Hemorrhage of anus and rectum: Secondary | ICD-10-CM | POA: Insufficient documentation

## 2022-03-11 DIAGNOSIS — R278 Other lack of coordination: Secondary | ICD-10-CM | POA: Insufficient documentation

## 2022-03-11 DIAGNOSIS — R252 Cramp and spasm: Secondary | ICD-10-CM | POA: Insufficient documentation

## 2022-03-11 DIAGNOSIS — K602 Anal fissure, unspecified: Secondary | ICD-10-CM | POA: Diagnosis present

## 2022-03-11 NOTE — Patient Instructions (Addendum)
Moisturizers They are used in the vagina to hydrate the mucous membrane that make up the vaginal canal. Designed to keep a more normal acid balance (ph) Once placed in the vagina, it will last between two to three days.  Use 2-3 times per week at bedtime  Ingredients to avoid is glycerin and fragrance, can increase chance of infection Should not be used just before sex due to causing irritation Most are gels administered either in a tampon-shaped applicator or as a vaginal suppository. They are non-hormonal.   Types of Moisturizers(internal use)  Vitamin E vaginal suppositories- Whole foods, Amazon Moist Again Coconut oil- can break down condoms Julva- (Do no use if on Tamoxifen) amazon Yes moisturizer- amazon NeuEve Silk , NeuEve Silver for menopausal or over 65 (if have severe vaginal atrophy or cancer treatments use NeuEve Silk for  1 month than move to The Pepsi)- [employer], [place].com Olive and Bee intimate cream- www.oliveandbee.com.au Mae vaginal moisturizer- Amazon Aloe    Creams to use externally on the Vulva area Albertson's (good for for cancer patients that had radiation to the area)- [place] or [employer].FlyingBasics.com.br V-magic cream - amazon Julva-amazon Vital "V [place] salve ( help moisturize and help with thinning vulvar area, does have [place] by Irwin Brakeman labial moisturizer (Amazon,  Coconut or olive oil aloe   Things to avoid in the vaginal area Do not use things to irritate the vulvar area No lotions just specialized creams for the vulva area- Neogyn, V-magic, No soaps; can use Aveeno or Calendula cleanser if needed. Must be gentle No deodorants No douches Good to sleep without underwear to let the vaginal area to air out No scrubbing: spread the lips to let warm water rinse over labias and pat dry    Lubrication Used for intercourse to reduce friction Avoid ones that  have glycerin, nonoxynol-9, petroleum, propylene glycol, chlorhexidine gluconate, warming gels, tingling gels, icing or cooling gel, scented Avoid parabens due to a preservative similar to female sex hormone May need to be reapplied once or several times during sexual activity Can be applied to both partners genitals prior to vaginal penetration to minimize friction or irritation Prevent irritation and mucosal tears that cause post coital pain and increased the risk of vaginal and urinary tract infections Oil-based lubricants cannot be used with condoms due to breaking them down.  Least likely to irritate vaginal tissue.  Plant based-lubes are safe Silicone-based lubrication are thicker and last long and used for post-menopausal women  Vaginal Lubricators Here is a list of some suggested lubricators you can use for intercourse. Use the most hypoallergenic product.  You can place on you or your partner.  Slippery Stuff ( water based) Sylk or Sliquid Natural H2O ( good  if frequent UTI's)- walmart, amazon Sliquid organics silk-(aloe and silicone based ) [employer] (www.blossom-organics.com)- (aloe based ) Coconut oil, olive oil -not good with condoms  PJur Woman Nude- (water based) amazon Uberlube- ( silicon) [place] has an organic one Yes lubricant- (water based and has plant oil based similar to silicone) [employer] Platinum-Silicone, Target, [facility] Olive and Bee intimate cream-  www.oliveandbee.com.au Pink - [employer] stuff Erosense Sync- walmart, amazon Coconu- FailLinks.co.uk  Things to avoid in lubricants are glycerin, warming gels, tingling gels, icing or cooling  gels, and scented gels.  Also avoid Vaseline. KY jelly, Replens, and Astroglide contain chlorhexidine which kills good bacteria(lactobacilli)  Things to avoid in  the vaginal area Do not use things to irritate the vulvar area No lotions- see below Soaps you  can use :Aveeno, Calendula, Good Clean  Love cleanser if needed. Must be gentle No deodorants No douches Good to sleep without underwear to let the vaginal area to air out No scrubbing: spread the lips to let warm water rinse over labias and pat dry  Creams that can be used on the Vulva Area V [employer], walmart Vital V Wild Yam Salve Julva- [employer] Botanical Pro-Meno Wild Yam Cream Coconut oil, olive oil Cleo by [employer] labial moisturizer -Amazon,  [place] Releveum ( lidocaine) or [place] Gele Yes Moisturizer    Toileting Techniques for Bowel Movements (Defecation) Using your belly (abdomen) and pelvic floor muscles to have a bowel movement is usually instinctive.  Sometimes people can have problems with these muscles and have to relearn proper defecation (emptying) techniques.  If you have weakness in your muscles, organs that are falling out, decreased sensation in your pelvis, or ignore your urge to go, you may find yourself straining to have a bowel movement.  You are straining if you are: holding your breath or taking in a huge gulp of air and holding it  keeping your lips and jaw tensed and closed tightly turning red in the face because of excessive pushing or forcing developing or worsening your  hemorrhoids getting faint while pushing not emptying completely and have to defecate many times a day  If you are straining, you are actually making it harder for yourself to have a bowel movement.  Many people find they are pulling up with the pelvic floor muscles and closing off instead of opening the anus. Due to lack pelvic floor relaxation and coordination the abdominal muscles, one has to work harder to push the feces out.  Many people have never been taught how to defecate efficiently and effectively.  Notice what happens to your body when you are having a bowel movement.  While you are sitting on the toilet pay attention to the following areas: Jaw and mouth position Angle of your hips   Whether  your feet touch the ground or not Arm placement  Spine position Waist Belly tension Anus (opening of the anal canal)  An Evacuation/Defecation Plan   Here are the 4 basic points:  Lean forward enough for your elbows to rest on your knees Support your feet on the floor or use a low stool if your feet don't touch the floor  Push out your belly as if you have swallowed a beach ball--you should feel a widening of your waist Open and relax your pelvic floor muscles, rather than tightening around the anus       The following conditions my require modifications to your toileting posture:  If you have had surgery in the past that limits your back, hip, pelvic, knee or ankle flexibility Constipation   Your healthcare practitioner may make the following additional suggestions and adjustments:  Sit on the toilet  a) Make sure your feet are supported. b) Notice your hip angle and spine position--most people find it effective to lean forward or raise their knees, which can help the muscles around the anus to relax  c) When you lean forward, place your forearms on your thighs for support  Relax suggestions a) Breath deeply in through your nose and out slowly through your mouth as if you are smelling the flowers and blowing out the candles. b) To become aware of how  to relax your muscles, contracting and releasing muscles can be helpful.  Pull your pelvic floor muscles in tightly by using the image of holding back gas, or closing around the anus (visualize making a circle smaller) and lifting the anus up and in.  Then release the muscles and your anus should drop down and feel open. Repeat 5 times ending with the feeling of relaxation. c) Keep your pelvic floor muscles relaxed; let your belly bulge out. d) The digestive tract starts at the mouth and ends at the anal opening, so be sure to relax both ends of the tube.  Place your tongue on the roof of your mouth with your teeth separated.  This  helps relax your mouth and will help to relax the anus at the same time.  Empty (defecation) a) Keep your pelvic floor and sphincter relaxed, then bulge your anal muscles.  Make the anal opening wide.  b) Stick your belly out as if you have swallowed a beach ball. c) Make your belly wall hard using your belly muscles while continuing to breathe. Doing this makes it easier to open your anus. d) Breath out and give a grunt (or try using other sounds such as ahhhh, shhhhh, ohhhh or grrrrrrr).  4) Finish a) As you finish your bowel movement, pull the pelvic floor muscles up and in.  This will leave your anus in the proper place rather than remaining pushed out and down. If you leave your anus pushed out and down, it will start to feel as though that is normal and give you incorrect signals about needing to have a bowel movement.   Earlie Counts, PT [facility] [address] [place], [place] [address] W: [phone] [email or web address]

## 2022-03-11 NOTE — Therapy (Signed)
OUTPATIENT PHYSICAL THERAPY FEMALE PELVIC EVALUATION   Patient Name: Alyssa Cox MRN: [id number] DOB:Jan 03, 1961, 61 y.o., female Today's Date: 03/11/2022   PT End of Session - 03/11/22 1509     Visit Number 1    Date for PT Re-Evaluation 06/03/22    Authorization Type BCBS    PT Start Time 1500    PT Stop Time 1545    PT Time Calculation (min) 45 min    Activity Tolerance Patient tolerated treatment well    Behavior During Therapy [facility] for tasks assessed/performed             Past Medical History:  Diagnosis Date   Allergic rhinitis    Endometriosis    Past Surgical History:  Procedure Laterality Date   ABDOMINAL HYSTERECTOMY     CESAREAN SECTION     HEMORRHOID SURGERY     VASCULAR SURGERY     Patient Active Problem List   Diagnosis Date Noted   Lower respiratory tract infection 12/17/2021   Allergic rhinitis 12/17/2021   Other fatigue 12/17/2021   Allergic reaction 11/26/2021   Xerosis cutis 11/26/2021   Gynecologic exam normal 11/19/2021   History of total abdominal hysterectomy and bilateral salpingo-oophorectomy 11/19/2021   Vaginal atrophy 11/19/2021   Glaucoma 09/23/2021   Healthcare maintenance 09/23/2021   Plantar fasciitis 09/23/2021   Need for shingles vaccine 09/23/2021    PCP: Libby Maw, MD  REFERRING PROVIDER: Mauri Pole., MD  REFERRING DIAG: K60.2 Anal Fissure; K62.5 Rectal Bleeding; 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 IBS last 15 years. Patient had hemorrhoid surgery  before.  Fluid intake: Yes: drinks water    Patient confirms identification and approves PT to assess pelvic floor and treatment Yes    PRECAUTIONS: None  WEIGHT BEARING RESTRICTIONS No  FALLS:  Has patient fallen in last 6 months? No  LIVING ENVIRONMENT: Lives with: lives with their family and lives with their spouse  OCCUPATION: not right now, walks 40 min.   PLOF: Independent  PATIENT GOALS strengthen muscles and go to the bathroom normally  PERTINENT HISTORY:  Abdominal Hysterectomy; C-section  BOWEL MOVEMENT Pain with bowel movement: Yes, sometimes Type of bowel movement:Type [facility]  Stool Scale) Type 1 and 4, Frequency 1-2 days , Strain Yes, and Splinting no Fully empty rectum: Yes: not always, she will feel the bowel movement but not the push to go to the  bathroom Leakage: No Fiber supplement: Yes: Benefiber  URINATION Pain with urination: No Fully empty bladder: Yes: sometimes not Stream:  feels weaker due to having to sit on the commode longer Urgency: No Frequency: average Leakage:  none    PREGNANCY C-section deliveries 2, hysterectomy scar Currently pregnant No  PROLAPSE None    OBJECTIVE:    COGNITION:  Overall cognitive status: Within functional limits for tasks assessed       POSTURE:  Slight increase in thoracic kyphosis  LUMBARAROM/PROM  A/PROM A/PROM  eval  Extension Decreased by 25%   (Blank rows = not tested)  LOWER EXTREMITY ROM:  Passive ROM Right eval Left eval  Hip external rotation 50 50   (Blank rows = not tested)  LOWER EXTREMITY MMT:  MMT Right eval Left eval  Hip flexion 4/5 4/5  Hip extension 4/5 4/5  Hip abduction 4/5 4/5   PELVIC MMT:   MMT eval  Internal Anal Sphincter 2/5  External Anal Sphincter 2/5  Puborectalis 2/5  (Blank rows = not tested)        PALPATION:   General  Restrictions in the abdominal scar;                 External Perineal Exam tightness along the right levator ani  and perineal body                             Internal Pelvic Floor inerted therapist pinky into the anal canal with desert harvest reveleum, tightness in the sphinter and puborectalis, trouble opening the anus with breath to push therapist finger out  TONE: increased  PROLAPSE: none  TODAY'S TREATMENT  EVAL See below   PATIENT EDUCATION:  Education details: educated patient on vaginal moisturizers, lubricants and how to toilet Person educated: Patient Education method: Explanation, Demonstration, Tactile cues, Verbal cues, and Handouts Education comprehension: verbalized understanding, returned demonstration, verbal cues required, tactile cues required, and needs further education   HOME EXERCISE PROGRAM: See above  ASSESSMENT:  CLINICAL IMPRESSION: Patient is a 61 y.o. female who was seen today for physical therapy evaluation and treatment for anal fissure and dyssynergic defecation. Patient has had the anal fissure for 2 weeks and the dyssynergic defecation for a long time.  Patient stool is Type [facility] Stool Scale) Type 1 and 4, Frequency 1-2 days  and she has to Strain.She feel her stool is at the edge of the rectum and she is not able to push it out. Rectal strength is 2/5. She will tighten on the therapist finger when facilitating her pushing stool our. She has trouble coordinating breath to push her stool out. She has tightness in the right levator ani, the anal sphincter and puborectalis. Her lower abdominal scar has restriction. Her lower abdominal has difficulty with contraction and she will over contract the upper abdominals. Bilateral hip strength is 4/5. Patient would benefit from skilled therapy to improve pelvic floor coordination to push stool out and improve toileting.    OBJECTIVE IMPAIRMENTS decreased activity tolerance, decreased coordination, decreased endurance, decreased strength, increased fascial restrictions, and impaired tone.   ACTIVITY LIMITATIONS  toileting  PARTICIPATION LIMITATIONS:  none  PERSONAL FACTORS Age, Fitness, and 1-2 comorbidities: abdominal hysterectomy and c-section  x2  are also affecting patient's functional outcome.   REHAB POTENTIAL: Excellent  CLINICAL DECISION MAKING: Stable/uncomplicated  EVALUATION COMPLEXITY: Low   GOALS: Goals reviewed with patient? Yes  SHORT TERM GOALS: Target date: 04/08/2022  Patient understands correct toileting and able to return demonstration.  Baseline: Goal status: INITIAL  2.  Patient is able to perform diaphragmatic breathing to relax the pelvic floor.  Baseline:  Goal status: INITIAL  3.  Patient educated on vaginal moisturizers and lubricants to improve vaginal health. Baseline:  Goal status: INITIAL   LONG TERM GOALS: Target date: 06/03/2022   Patient is independent with advanced core strengthening to improve toileting.  Baseline:  Goal status: INITIAL  2.  Patient reports she strains less than 10% due to the ability to relax the pelvic floor with bowel movement.  Baseline:  Goal status: INITIAL  3.  Anal strength >/= 3/5 to assist in pushing the stool out with greater ease.  Baseline:  Goal status: INITIAL  4.  Patient has improve mobility of lower abdominal scar so she is able to contract the upper and lower abdominals equally.  Baseline:  Goal status: INITIAL   PLAN: PT FREQUENCY: 1x/week  PT DURATION: 12 weeks  PLANNED INTERVENTIONS: Therapeutic exercises, Therapeutic activity, Neuromuscular re-education, Patient/Family education, Joint mobilization, Dry Needling, Spinal mobilization, Cryotherapy, Moist heat, scar mobilization, Taping, Biofeedback, and Manual therapy  PLAN FOR NEXT SESSION: work on the right pelvic floor to improve tissue mobility, work on opening the anus for toileting, hip stretches, review toileting   Earlie Counts, PT 03/11/22 5:26 PM

## 2022-03-18 ENCOUNTER — Encounter: Payer: Self-pay | Admitting: Physical Therapy

## 2022-03-18 ENCOUNTER — Encounter: Payer: BC Managed Care – PPO | Attending: Gastroenterology | Admitting: Physical Therapy

## 2022-03-18 DIAGNOSIS — R278 Other lack of coordination: Secondary | ICD-10-CM | POA: Insufficient documentation

## 2022-03-18 DIAGNOSIS — R252 Cramp and spasm: Secondary | ICD-10-CM | POA: Diagnosis present

## 2022-03-18 NOTE — Therapy (Signed)
OUTPATIENT PHYSICAL THERAPY TREATMENT NOTE   Patient Name: Alyssa Cox MRN: [id number] DOB:1961/04/29, 61 y.o., female Today's Date: 03/18/2022  PCP: Libby Maw, MD REFERRING PROVIDER: Mauri Pole., MD  END OF SESSION:   PT End of Session - 03/18/22 1508     Visit Number 2    Date for PT Re-Evaluation 06/03/22    Authorization Type BCBS    Authorization - Visit Number 2    Authorization - Number of Visits 90    PT Start Time 1500    PT Stop Time 1545    PT Time Calculation (min) 45 min    Activity Tolerance Patient tolerated treatment well    Behavior During Therapy WFL for tasks assessed/performed             Past Medical History:  Diagnosis Date   Allergic rhinitis    Endometriosis    Past Surgical History:  Procedure Laterality Date   ABDOMINAL HYSTERECTOMY     CESAREAN SECTION     HEMORRHOID SURGERY     VASCULAR SURGERY     Patient Active Problem List   Diagnosis Date Noted   Lower respiratory tract infection 12/17/2021   Allergic rhinitis 12/17/2021   Other fatigue 12/17/2021   Allergic reaction 11/26/2021   Xerosis cutis 11/26/2021   Gynecologic exam normal 11/19/2021   History of total abdominal hysterectomy and bilateral salpingo-oophorectomy 11/19/2021   Vaginal atrophy 11/19/2021   Glaucoma 09/23/2021   Healthcare maintenance 09/23/2021   Plantar fasciitis 09/23/2021   Need for shingles vaccine 09/23/2021    REFERRING DIAG:  THERAPY DIAG:  Other lack of coordination  Cramp and spasm  Rationale for Evaluation and Treatment Rehabilitation  PERTINENT HISTORY: K60.2 Anal Fissure; K62.5 Rectal Bleeding; 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 After therapy I had trouble having a bowel movement for 2 days.  PATIENT GOALS strengthen muscles and go to the bathroom normally PAIN:  Are you having pain? No BOWEL MOVEMENT Pain with  bowel movement: Yes, sometimes Type of bowel movement:Type ([place] Stool Scale) Type 1 and 4, Frequency 1-2 days , Strain Yes, and Splinting no Fully empty rectum: Yes: not always, she will feel the bowel movement but not the push to go to the  bathroom Leakage: No Fiber supplement: Yes: Benefiber   URINATION Pain with urination: No Fully empty bladder: Yes: sometimes not Stream:  feels weaker due to having to sit on the commode longer Urgency: No Frequency: average Leakage:  none  OBJECTIVE: (objective measures completed at initial evaluation unless otherwise dated) COGNITION:            Overall cognitive status: Within functional limits for tasks assessed                              POSTURE:  Slight increase in thoracic kyphosis   LUMBARAROM/PROM   A/PROM A/PROM  eval  Extension Decreased by 25%   (Blank rows = not tested)   LOWER EXTREMITY ROM:   Passive ROM Right eval Left eval  Hip external rotation 50 50   (Blank rows = not tested)   LOWER EXTREMITY MMT:   MMT Right eval Left eval  Hip flexion 4/5 4/5  Hip extension 4/5 4/5  Hip abduction 4/5 4/5    PELVIC MMT:   MMT eval  Internal Anal Sphincter 2/5  External Anal Sphincter 2/5  Puborectalis 2/5  (Blank rows = not tested)         PALPATION:   General  Restrictions in the abdominal scar;                  External Perineal Exam tightness along the right levator ani and perineal body                             Internal Pelvic Floor inerted therapist pinky into the anal canal with desert harvest reveleum, tightness in the sphinter and puborectalis, trouble opening the anus with breath to push therapist finger out   TONE: increased   PROLAPSE: none   TODAY'S TREATMENT  03/18/2022 Manual: Soft tissue mobilization:Manual work to the lumbar paraspinals, quadratus, gluteus, piriformis, and levator ani externally Myofascial release:tissue rolling of the lumbar fascia, fascial release of the  gluteal, around the coccyx and levator ani  Spinal mobilization: PA and side glide to L1-L5 grade 3 in prone Exercises: Stretches/mobility:supine hamstring with strap, right, left, 30 sec    Supine trunk rotation with leg over bil. 30 sec    Happy baby in supine 30 sec    Quadruped cat cow 15x    Childs pose holding for 30 sec 1x    PATIENT EDUCATION: Education details: Access Code: [email or web address] Person educated: Patient Education method: Explanation, Demonstration, Tactile cues, Verbal cues, and Handouts Education comprehension: verbalized understanding, returned demonstration, verbal cues required, tactile cues required, and needs further education      HOME EXERCISE PROGRAM: Access Code: [email or web address] URL: [email or web address] Date: 03/18/2022 Prepared by: Earlie Counts  Exercises - Supine Hamstring Stretch with Strap  - 1 x daily - 7 x weekly - 1 sets - 2 reps - 30 sec hold - Supine Piriformis Stretch with Leg Straight  - 1 x daily - 7 x weekly - 1 sets - 2 reps - 30 sec hold - Happy Baby with Pelvic Floor Lengthening  - 1 x daily - 7 x weekly - 1 sets - 1 reps - 30 sec hold - Cat Cow  - 1 x daily - 7 x weekly - 1 sets - 15 reps - Child's Pose Stretch  - 1 x daily - 7 x weekly - 1 sets - 1 reps - 30 sec hold   ASSESSMENT:   CLINICAL IMPRESSION: Patient is a 61 y.o. female who was seen today for physical therapy evaluation and treatment for anal fissure and dyssynergic defecation. Patient has tightness in the lumbar spine and muscles restrictions. Patient was abel to extend her lumbar spine better after the mobilization. Patient has improved dryness around the rectum and vaginal tissue using her moisturizer. Patient was able to do her stretches correctly but difficulty with cat cow due to decreased lumbar extenion.  Patient would benefit from skilled therapy to improve pelvic floor coordination to push stool out and improve toileting.      OBJECTIVE IMPAIRMENTS decreased  activity tolerance, decreased coordination, decreased endurance, decreased strength, increased fascial restrictions, and impaired tone.    ACTIVITY LIMITATIONS toileting   PARTICIPATION LIMITATIONS:  none   PERSONAL FACTORS Age, Fitness, and 1-2 comorbidities: abdominal hysterectomy and c-section  x2  are also affecting patient's functional outcome.    REHAB POTENTIAL: Excellent   CLINICAL DECISION MAKING: Stable/uncomplicated   EVALUATION COMPLEXITY: Low     GOALS: Goals reviewed with patient? Yes   SHORT TERM GOALS: Target date: 04/08/2022   Patient understands correct toileting  and able to return demonstration.  Baseline: Goal status: INITIAL   2.  Patient is able to perform diaphragmatic breathing to relax the pelvic floor.  Baseline:  Goal status: INITIAL   3.  Patient educated on vaginal moisturizers and lubricants to improve vaginal health. Baseline:  Goal status: Met    LONG TERM GOALS: Target date: 06/03/2022    Patient is independent with advanced core strengthening to improve toileting.  Baseline:  Goal status: INITIAL   2.  Patient reports she strains less than 10% due to the ability to relax the pelvic floor with bowel movement.  Baseline:  Goal status: INITIAL   3.  Anal strength >/= 3/5 to assist in pushing the stool out with greater ease.  Baseline:  Goal status: INITIAL   4.  Patient has improve mobility of lower abdominal scar so she is able to contract the upper and lower abdominals equally.  Baseline:  Goal status: INITIAL     PLAN: PT FREQUENCY: 1x/week   PT DURATION: 12 weeks   PLANNED INTERVENTIONS: Therapeutic exercises, Therapeutic activity, Neuromuscular re-education, Patient/Family education, Joint mobilization, Dry Needling, Spinal mobilization, Cryotherapy, Moist heat, scar mobilization, Taping, Biofeedback, and Manual therapy   PLAN FOR NEXT SESSION: work on the right pelvic floor to improve tissue mobility, work on opening the  anus for toileting, review toileting; mobilize the lumbar and thoracic spine   Earlie Counts, PT 03/18/22 3:59 PM

## 2022-03-23 ENCOUNTER — Encounter: Payer: Self-pay | Admitting: Physical Therapy

## 2022-03-30 ENCOUNTER — Encounter: Payer: BC Managed Care – PPO | Admitting: Physical Therapy

## 2022-03-30 ENCOUNTER — Encounter: Payer: Self-pay | Admitting: Physical Therapy

## 2022-03-30 DIAGNOSIS — R278 Other lack of coordination: Secondary | ICD-10-CM

## 2022-03-30 DIAGNOSIS — R252 Cramp and spasm: Secondary | ICD-10-CM

## 2022-03-30 NOTE — Patient Instructions (Signed)

## 2022-03-30 NOTE — Therapy (Signed)
OUTPATIENT PHYSICAL THERAPY TREATMENT NOTE   Patient Name: BUFFY EHLER MRN: [id number] DOB:[birth year], 61 y.o., female Today's Date: 03/30/2022  PCP: Libby Maw, MD REFERRING PROVIDER: Mauri Pole., MD  END OF SESSION:   PT End of Session - 03/30/22 0834     Visit Number 3    Date for PT Re-Evaluation 06/03/22    Authorization Type BCBS    Authorization - Visit Number 3    Authorization - Number of Visits 90    PT Start Time 0830    PT Stop Time 0915    PT Time Calculation (min) 45 min    Activity Tolerance Patient tolerated treatment well    Behavior During Therapy [facility] for tasks assessed/performed             Past Medical History:  Diagnosis Date   Allergic rhinitis    Endometriosis    Past Surgical History:  Procedure Laterality Date   ABDOMINAL HYSTERECTOMY     CESAREAN SECTION     HEMORRHOID SURGERY     VASCULAR SURGERY     Patient Active Problem List   Diagnosis Date Noted   Lower respiratory tract infection 12/17/2021   Allergic rhinitis 12/17/2021   Other fatigue 12/17/2021   Allergic reaction 11/26/2021   Xerosis cutis 11/26/2021   Gynecologic exam normal 11/19/2021   History of total abdominal hysterectomy and bilateral salpingo-oophorectomy 11/19/2021   Vaginal atrophy 11/19/2021   Glaucoma 09/23/2021   Healthcare maintenance 09/23/2021   Plantar fasciitis 09/23/2021   Need for shingles vaccine 09/23/2021    REFERRING DIAG: K60.2 Anal Fissure; K62.5 Rectal Bleeding; 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 I am doing well. I can go every day. I am still taking the powder drink. I have the Hemorrhoids taking care of in August.   PAIN:  Are you having pain?  No  PATIENT GOALS strengthen muscles and go to the bathroom normally   BOWEL MOVEMENT Pain with bowel movement: Yes, sometimes Type of bowel movement:Type (Bristol Stool Scale) Type 1 and 4, Frequency 1-2 days , Strain Yes, and Splinting no Fully empty rectum: Yes: not always, she will feel the bowel movement but not the push to go to the  bathroom Leakage: No Fiber supplement: Yes: Benefiber   URINATION Pain with urination: No Fully empty bladder: Yes: sometimes not Stream:  feels weaker due to having to sit on the commode longer Urgency: No Frequency: average Leakage:  none  OBJECTIVE: (objective measures completed at initial evaluation unless otherwise dated)    COGNITION:            Overall cognitive status: Within functional limits for tasks assessed                              POSTURE:  Slight increase in thoracic kyphosis   LUMBARAROM/PROM   A/PROM A/PROM  eval A/ROM 03/30/2022  Extension Decreased by 25% Decreased by 25%   (Blank rows = not tested)   LOWER EXTREMITY ROM:   Passive ROM Right eval Left eval  Hip external rotation 50 50   (Blank rows = not tested)   LOWER EXTREMITY MMT:   MMT Right eval Left eval  Hip flexion 4/5 4/5  Hip extension  4/5 4/5  Hip abduction 4/5 4/5    PELVIC MMT:   MMT eval  Internal Anal Sphincter 2/5  External Anal Sphincter 2/5  Puborectalis 2/5  (Blank rows = not tested)         PALPATION:   General  Restrictions in the abdominal scar;                  External Perineal Exam tightness along the right levator ani and perineal body                             Internal Pelvic Floor inerted therapist pinky into the anal canal with desert harvest reveleum, tightness in the sphinter and puborectalis, trouble opening the anus with breath to push therapist finger out   TONE: increased   PROLAPSE: none   TODAY'S TREATMENT  03/30/2022 Manual: Soft tissue mobilization: To assess for dry needling, manual work  to the right quadratus and lumbar paraspinals with addaday in prone to elongate after dry needling, sidely manual work tot he right quadratus.  Myofascial release: Spinal mobilization: PA and rotational mobilization to right side of L1-L5 Internal pelvic floor techniques: going through the anus manual work to the sphincter, along the anococcygeal ligament, mobilization of the coccyx, one finger internally and other externally fascial release along the internal anal sphincter and along th e rectum to release the restrictions.  No emotional/communication barriers or cognitive limitation. Patient is motivated to learn. Patient understands and agrees with treatment goals and plan. PT explains patient will be examined in standing, sitting, and lying down to see how their muscles and joints work. When they are ready, they will be asked to remove their underwear so PT can examine their perineum. The patient is also given the option of providing their own chaperone as one is not provided in our facility. The patient also has the right and is explained the right to defer or refuse any part of the evaluation or treatment including the internal exam. With the patient's consent, PT will use one gloved finger to gently assess the muscles of the pelvic floor, seeing how well it contracts and relaxes and if there is muscle symmetry. After, the patient will get dressed and PT and patient will discuss exam findings and plan of care. PT and patient discuss plan of care, schedule, attendance policy and HEP activities.  Trigger Point Dry-Needling  Treatment instructions: Expect mild to moderate muscle soreness. S/S of pneumothorax if dry needled over a lung field, and to seek immediate medical attention should they occur. Patient verbalized understanding of these instructions and education.  Patient Consent Given: Yes Education handout provided: Yes Muscles treated: right quadratus, L1-L3 multifidi Electrical stimulation  performed: No Parameters: N/A Treatment response/outcome: elongation of muscles and trigger point response  Neuromuscular re-education: Down training: while therapist finger in the anal canal while patient is using breath to relax the pelvic floro and push the therapist finger out of the canal.     03/18/2022 Manual: Soft tissue mobilization:Manual work to the lumbar paraspinals, quadratus, gluteus, piriformis, and levator ani externally Myofascial release:tissue rolling of the lumbar fascia, fascial release of the gluteal, around the coccyx and levator ani  Spinal mobilization: PA and side glide to L1-L5 grade 3 in prone Exercises: Stretches/mobility:supine hamstring with strap, right, left, 30 sec  Supine trunk rotation with leg over bil. 30 sec                                     Happy baby in supine 30 sec                                     Quadruped cat cow 15x                                     Childs pose holding for 30 sec 1x   EVAL See below     PATIENT EDUCATION:  Education details: educated patient on vaginal moisturizers, lubricants and how to toilet Person educated: Patient Education method: Consulting civil engineer, Demonstration, Tactile cues, Verbal cues, and Handouts Education comprehension: verbalized understanding, returned demonstration, verbal cues required, tactile cues required, and needs further education     HOME EXERCISE PROGRAM: See above   ASSESSMENT:   CLINICAL IMPRESSION: Patient is a 61 y.o. female who was seen today for physical therapy evaluation and treatment for anal fissure and dyssynergic defecation. Patient has some thickness in the posterior rectum. She has fascial restrictions on the side of the rectum. Patient has difficult with pushing the therapist finger out of the anal canal. She stool is still skinny. Her right lumbar has increased in mobility. Patient has had the anal fissure for 2 weeks and the dyssynergic  defecation for a long time.  . Patient would benefit from skilled therapy to improve pelvic floor coordination to push stool out and improve toileting.      OBJECTIVE IMPAIRMENTS decreased activity tolerance, decreased coordination, decreased endurance, decreased strength, increased fascial restrictions, and impaired tone.    ACTIVITY LIMITATIONS toileting   PARTICIPATION LIMITATIONS:  none   PERSONAL FACTORS Age, Fitness, and 1-2 comorbidities: abdominal hysterectomy and c-section  x2  are also affecting patient's functional outcome.    REHAB POTENTIAL: Excellent   CLINICAL DECISION MAKING: Stable/uncomplicated   EVALUATION COMPLEXITY: Low     GOALS: Goals reviewed with patient? Yes   SHORT TERM GOALS: Target date: 04/08/2022   Patient understands correct toileting and able to return demonstration.  Baseline: Goal status: ongoing 03/30/2022   2.  Patient is able to perform diaphragmatic breathing to relax the pelvic floor.  Baseline:  Goal status: ongoing 03/30/2022   3.  Patient educated on vaginal moisturizers and lubricants to improve vaginal health. Baseline:  Goal status: Met 03/30/2022    LONG TERM GOALS: Target date: 06/03/2022    Patient is independent with advanced core strengthening to improve toileting.  Baseline:  Goal status: INITIAL   2.  Patient reports she strains less than 10% due to the ability to relax the pelvic floor with bowel movement.  Baseline:  Goal status: INITIAL   3.  Anal strength >/= 3/5 to assist in pushing the stool out with greater ease.  Baseline:  Goal status: INITIAL   4.  Patient has improve mobility of lower abdominal scar so she is able to contract the upper and lower abdominals equally.  Baseline:  Goal status: INITIAL     PLAN: PT FREQUENCY: 1x/week   PT DURATION: 12 weeks   PLANNED INTERVENTIONS: Therapeutic exercises, Therapeutic activity, Neuromuscular re-education, Patient/Family education, Joint mobilization, Dry  Needling,  Spinal mobilization, Cryotherapy, Moist heat, scar mobilization, Taping, Biofeedback, and Manual therapy   PLAN FOR NEXT SESSION: work on the right pelvic floor to improve tissue mobility, work on opening the anus for toileting in sitting with feet on stool. review toileting; mobilize the lumbar and thoracic spine, see how back is feeling.   Earlie Counts, PT 03/30/22 11:03 AM

## 2022-04-01 ENCOUNTER — Encounter: Payer: BC Managed Care – PPO | Admitting: Physical Therapy

## 2022-04-14 ENCOUNTER — Other Ambulatory Visit (HOSPITAL_COMMUNITY)
Admission: RE | Admit: 2022-04-14 | Discharge: 2022-04-14 | Disposition: A | Discharge status: Home / Self Care | Payer: BC Managed Care – PPO | Source: Ambulatory Visit | Attending: Obstetrics and Gynecology | Admitting: Obstetrics and Gynecology

## 2022-04-14 ENCOUNTER — Encounter: Payer: Self-pay | Admitting: Obstetrics and Gynecology

## 2022-04-14 ENCOUNTER — Ambulatory Visit (INDEPENDENT_AMBULATORY_CARE_PROVIDER_SITE_OTHER): Payer: BC Managed Care – PPO | Admitting: Obstetrics and Gynecology

## 2022-04-14 ENCOUNTER — Other Ambulatory Visit: Payer: Self-pay

## 2022-04-14 VITALS — BP 121/68 | HR 66 | Ht 65.0 in | Wt 137.0 lb

## 2022-04-14 DIAGNOSIS — N952 Postmenopausal atrophic vaginitis: Secondary | ICD-10-CM

## 2022-04-14 MED ORDER — ESTRADIOL 10 MCG VA TABS: 1.0000 | ORAL_TABLET | VAGINAL | 11 refills | Status: DC

## 2022-04-14 NOTE — Progress Notes (Signed)
Ms Hemrick presents for f/u of her vaginal atrophy. Reports doing better since starting Estrace cream. Still some vaginal dryness but not as severe as previously.  PE AF VSS Lungs clear Heart RRR Abd soft + BS  GU Nl EGBUS, vaginal atrophy appears to be improved from last exam, vaginal cuff  smear obtained  A/P Vaginal atrophy  Will change Estrace cream to vaginafem. F/U in 6 months

## 2022-04-15 ENCOUNTER — Encounter: Payer: BC Managed Care – PPO | Admitting: Physical Therapy

## 2022-04-19 LAB — CYTOLOGY - PAP
Comment: NEGATIVE
Diagnosis: NEGATIVE
High risk HPV: NEGATIVE

## 2022-04-27 ENCOUNTER — Encounter: Payer: Self-pay | Admitting: Physical Therapy

## 2022-04-27 ENCOUNTER — Encounter: Payer: BC Managed Care – PPO | Attending: Gastroenterology | Admitting: Physical Therapy

## 2022-04-27 DIAGNOSIS — R198 Other specified symptoms and signs involving the digestive system and abdomen: Secondary | ICD-10-CM | POA: Insufficient documentation

## 2022-04-27 DIAGNOSIS — K602 Anal fissure, unspecified: Secondary | ICD-10-CM | POA: Diagnosis not present

## 2022-04-27 DIAGNOSIS — K5902 Outlet dysfunction constipation: Secondary | ICD-10-CM | POA: Diagnosis not present

## 2022-04-27 DIAGNOSIS — K625 Hemorrhage of anus and rectum: Secondary | ICD-10-CM | POA: Insufficient documentation

## 2022-04-27 DIAGNOSIS — R278 Other lack of coordination: Secondary | ICD-10-CM | POA: Diagnosis not present

## 2022-04-27 DIAGNOSIS — R252 Cramp and spasm: Secondary | ICD-10-CM | POA: Insufficient documentation

## 2022-04-27 NOTE — Therapy (Signed)
OUTPATIENT PHYSICAL THERAPY TREATMENT NOTE   Patient Name: Alyssa Cox MRN: [id number] DOB:1961-03-30, 61 y.o., female Today's Date: 04/27/2022  PCP: Libby Maw, MD REFERRING PROVIDER: Mauri Pole., MD  END OF SESSION:   PT End of Session - 04/27/22 1358     Visit Number 4    Date for PT Re-Evaluation 06/03/22    Authorization Type [employer]    Authorization - Visit Number 4    Authorization - Number of Visits 66    PT Start Time 1779    PT Stop Time 1445    PT Time Calculation (min) 47 min    Activity Tolerance Patient tolerated treatment well    Behavior During Therapy WFL for tasks assessed/performed             Past Medical History:  Diagnosis Date   Allergic rhinitis    Endometriosis    Past Surgical History:  Procedure Laterality Date   ABDOMINAL HYSTERECTOMY     CESAREAN SECTION     HEMORRHOID SURGERY     VASCULAR SURGERY     Patient Active Problem List   Diagnosis Date Noted   Allergic rhinitis 12/17/2021   Other fatigue 12/17/2021   Allergic reaction 11/26/2021   Xerosis cutis 11/26/2021   Gynecologic exam normal 11/19/2021   History of total abdominal hysterectomy and bilateral salpingo-oophorectomy 11/19/2021   Vaginal atrophy 11/19/2021   Glaucoma 09/23/2021   Healthcare maintenance 09/23/2021   Plantar fasciitis 09/23/2021   Need for shingles vaccine 09/23/2021   REFERRING DIAG: K60.2 Anal Fissure; K62.5 Rectal Bleeding; 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 I feel like I am almost going to get a spasm in the back. Past few days I have strained.  The dry needling helped.  PAIN:  Are you having pain? Yes NPRS scale: 7/10 Pain location: thoracic Pain  orientation: Left and Posterior  PAIN TYPE: pinch Pain description: intermittent  Aggravating factors: raise left arm overhead and reaching forward Relieving factors: keep arm at side    PATIENT GOALS strengthen muscles and go to the bathroom normally   BOWEL MOVEMENT Pain with bowel movement: Yes, sometimes Type of bowel movement:Type (Bristol Stool Scale) Type 1 and 4, Frequency 1-2 days , Strain Yes, and Splinting no Fully empty rectum: Yes: not always, she will feel the bowel movement but not the push to go to the  bathroom Leakage: No Fiber supplement: Yes: Benefiber   URINATION Pain with urination: No Fully empty bladder: Yes: sometimes not Stream:  feels weaker due to having to sit on the commode longer Urgency: No Frequency: average Leakage:  none   OBJECTIVE: (objective measures completed at initial evaluation unless otherwise dated)       COGNITION:            Overall cognitive status: Within functional limits for tasks assessed                              POSTURE:  Slight increase in thoracic kyphosis   LUMBARAROM/PROM   A/PROM A/PROM  eval A/ROM 03/30/2022  Extension Decreased by 25% Decreased by 25%   (Blank rows = not tested)   LOWER EXTREMITY ROM:   Passive ROM  Right eval Left eval  Hip external rotation 50 50   (Blank rows = not tested)   LOWER EXTREMITY MMT:   MMT Right eval Left eval  Hip flexion 4/5 4/5  Hip extension 4/5 4/5  Hip abduction 4/5 4/5    PELVIC MMT:   MMT eval  Internal Anal Sphincter 2/5  External Anal Sphincter 2/5  Puborectalis 2/5  (Blank rows = not tested)         PALPATION:   General  Restrictions in the abdominal scar;                  External Perineal Exam tightness along the right levator ani and perineal body                             Internal Pelvic Floor inerted therapist pinky into the anal canal with desert harvest reveleum, tightness in the sphinter and puborectalis, trouble opening the anus  with breath to push therapist finger out   TONE: increased   PROLAPSE: none   TODAY'S TREATMENT  04/27/2022 Manual: Soft tissue mobilization: to assess for dry needling; to bilateral thoracic and lumbar paraspinals, along the quadratus to elongate after dry needling.  Myofascial release:tissue rolling of the thoracic and lumbar Trigger Point Dry-Needling  Treatment instructions: Expect mild to moderate muscle soreness. S/S of pneumothorax if dry needled over a lung field, and to seek immediate medical attention should they occur. Patient verbalized understanding of these instructions and education.  Patient Consent Given: Yes Education handout provided: Previously provided Muscles treated: thoracic and lumbar multifidi, right erector spinae Electrical stimulation performed: No Parameters: N/A Treatment response/outcome: trigger response elicited and palpable elongation of muscle  Exercises: Stretches/mobility:cross legged sitting with reaching overhead    Lay on rolled up towel to stretch the thoracic spine to improve rib cage mobility    Sitting in chair and extend back over edge of chair    Standing to stretch the lats Self-care: Discussed with patient onstaying hydrated for better bowel movements and about smooth move tea to assist with bowel movements.     03/30/2022 Manual: Soft tissue mobilization: To assess for dry needling, manual work to the right quadratus and lumbar paraspinals with addaday in prone to elongate after dry needling, sidely manual work tot he right quadratus.  Myofascial release: Spinal mobilization: PA and rotational mobilization to right side of L1-L5 Internal pelvic floor techniques: going through the anus manual work to the sphincter, along the anococcygeal ligament, mobilization of the coccyx, one finger internally and other externally fascial release along the internal anal sphincter and along th e rectum to release the restrictions.  No  emotional/communication barriers or cognitive limitation. Patient is motivated to learn. Patient understands and agrees with treatment goals and plan. PT explains patient will be examined in standing, sitting, and lying down to see how their muscles and joints work. When they are ready, they will be asked to remove their underwear so PT can examine their perineum. The patient is also given the option of providing their own chaperone as one is not provided in our facility. The patient also has the right and is explained the right to defer or refuse any part of the evaluation or treatment including the internal exam. With the patient's consent, PT will use one gloved finger to gently assess the muscles of the pelvic floor, seeing how well it contracts and relaxes and if there is muscle symmetry.  After, the patient will get dressed and PT and patient will discuss exam findings and plan of care. PT and patient discuss plan of care, schedule, attendance policy and HEP activities.   Trigger Point Dry-Needling  Treatment instructions: Expect mild to moderate muscle soreness. S/S of pneumothorax if dry needled over a lung field, and to seek immediate medical attention should they occur. Patient verbalized understanding of these instructions and education.   Patient Consent Given: Yes Education handout provided: Yes Muscles treated: right quadratus, L1-L3 multifidi Electrical stimulation performed: No Parameters: N/A Treatment response/outcome: elongation of muscles and trigger point response   Neuromuscular re-education: Down training: while therapist finger in the anal canal while patient is using breath to relax the pelvic floro and push the therapist finger out of the canal.      03/18/2022 Manual: Soft tissue mobilization:Manual work to the lumbar paraspinals, quadratus, gluteus, piriformis, and levator ani externally Myofascial release:tissue rolling of the lumbar fascia, fascial release of the  gluteal, around the coccyx and levator ani  Spinal mobilization: PA and side glide to L1-L5 grade 3 in prone Exercises: Stretches/mobility:supine hamstring with strap, right, left, 30 sec                                     Supine trunk rotation with leg over bil. 30 sec                                     Happy baby in supine 30 sec                                     Quadruped cat cow 15x                                     Childs pose holding for 30 sec 1x   EVAL See below     PATIENT EDUCATION:  Education details: educated patient on vaginal moisturizers, lubricants and how to toilet Person educated: Patient Education method: Consulting civil engineer, Demonstration, Tactile cues, Verbal cues, and Handouts Education comprehension: verbalized understanding, returned demonstration, verbal cues required, tactile cues required, and needs further education     HOME EXERCISE PROGRAM: See above   ASSESSMENT:   CLINICAL IMPRESSION: Patient is a 61 y.o. female who was seen today for physical therapy treatment for anal fissure and dyssynergic defecation. Patient is doing better with bowel movements unless she feel dehydrated then she will have to strain. Patient has tightness in the spine making it difficult to move the pelvis with toileting and increased her thoracic kyphosis to decrease diaphragmatic breathing. After therapy she had increased in thoracic extension and spinal mobility to help with toileting. . Patient would benefit from skilled therapy to improve pelvic floor coordination to push stool out and improve toileting.      OBJECTIVE IMPAIRMENTS decreased activity tolerance, decreased coordination, decreased endurance, decreased strength, increased fascial restrictions, and impaired tone.    ACTIVITY LIMITATIONS toileting   PARTICIPATION LIMITATIONS:  none   PERSONAL FACTORS Age, Fitness, and 1-2 comorbidities: abdominal hysterectomy and c-section  x2  are also affecting patient's functional  outcome.    REHAB POTENTIAL: Excellent   CLINICAL DECISION MAKING:  Stable/uncomplicated   EVALUATION COMPLEXITY: Low     GOALS: Goals reviewed with patient? Yes   SHORT TERM GOALS: Target date: 04/08/2022   Patient understands correct toileting and able to return demonstration.  Baseline: Goal status: met 04/27/2022   2.  Patient is able to perform diaphragmatic breathing to relax the pelvic floor.  Baseline:  Goal status: met 04/27/2022   3.  Patient educated on vaginal moisturizers and lubricants to improve vaginal health. Baseline:  Goal status: Met 03/30/2022    LONG TERM GOALS: Target date: 06/03/2022    Patient is independent with advanced core strengthening to improve toileting.  Baseline:  Goal status: INITIAL   2.  Patient reports she strains less than 10% due to the ability to relax the pelvic floor with bowel movement.  Baseline:  Goal status: INITIAL   3.  Anal strength >/= 3/5 to assist in pushing the stool out with greater ease.  Baseline:  Goal status: INITIAL   4.  Patient has improve mobility of lower abdominal scar so she is able to contract the upper and lower abdominals equally.  Baseline:  Goal status: INITIAL     PLAN: PT FREQUENCY: 1x/week   PT DURATION: 12 weeks   PLANNED INTERVENTIONS: Therapeutic exercises, Therapeutic activity, Neuromuscular re-education, Patient/Family education, Joint mobilization, Dry Needling, Spinal mobilization, Cryotherapy, Moist heat, scar mobilization, Taping, Biofeedback, and Manual therapy   PLAN FOR NEXT SESSION:  work on opening the anus for toileting in sitting with feet on stool. review toileting; mobilize the lumbar and thoracic spine, see how back is feeling.    Earlie Counts, PT 04/27/22 3:00 PM

## 2022-05-04 ENCOUNTER — Encounter: Payer: Self-pay | Admitting: Physical Therapy

## 2022-05-04 ENCOUNTER — Encounter: Payer: BC Managed Care – PPO | Admitting: Physical Therapy

## 2022-05-04 DIAGNOSIS — R278 Other lack of coordination: Secondary | ICD-10-CM

## 2022-05-04 DIAGNOSIS — R252 Cramp and spasm: Secondary | ICD-10-CM

## 2022-05-04 NOTE — Therapy (Signed)
OUTPATIENT PHYSICAL THERAPY TREATMENT NOTE   Patient Name: Alyssa Cox MRN: [id number] DOB:1961-01-19, 61 y.o., female Today's Date: 05/04/2022  PCP: Libby Maw, MD REFERRING PROVIDER: Mauri Pole., MD  END OF SESSION:   PT End of Session - 05/04/22 0829     Visit Number 5    Date for PT Re-Evaluation 06/03/22    Authorization Type [employer]    Authorization - Visit Number 5    Authorization - Number of Visits 90    PT Start Time 0825    PT Stop Time 0915    PT Time Calculation (min) 50 min    Activity Tolerance Patient tolerated treatment well    Behavior During Therapy [facility] for tasks assessed/performed             Past Medical History:  Diagnosis Date   Allergic rhinitis    Endometriosis    Past Surgical History:  Procedure Laterality Date   ABDOMINAL HYSTERECTOMY     CESAREAN SECTION     HEMORRHOID SURGERY     VASCULAR SURGERY     Patient Active Problem List   Diagnosis Date Noted   Allergic rhinitis 12/17/2021   Other fatigue 12/17/2021   Allergic reaction 11/26/2021   Xerosis cutis 11/26/2021   Gynecologic exam normal 11/19/2021   History of total abdominal hysterectomy and bilateral salpingo-oophorectomy 11/19/2021   Vaginal atrophy 11/19/2021   Glaucoma 09/23/2021   Healthcare maintenance 09/23/2021   Plantar fasciitis 09/23/2021   Need for shingles vaccine 09/23/2021   REFERRING DIAG: K60.2 Anal Fissure; K62.5 Rectal Bleeding; 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 I feel like I am almost going to get a spasm in the back. Past few days I have strained.  The dry needling helped.  PAIN:  Are you having pain? Yes NPRS scale: 7/10 Pain location:  thoracic Pain orientation: Left and Posterior  PAIN TYPE: pinch Pain description: intermittent  Aggravating factors: raise left arm overhead and reaching forward Relieving factors: keep arm at side     PATIENT GOALS strengthen muscles and go to the bathroom normally   BOWEL MOVEMENT Pain with bowel movement: Yes, sometimes Type of bowel movement:Type (Bristol Stool Scale) Type 1 and 4, Frequency 1-2 days , Strain Yes, and Splinting no Fully empty rectum: Yes: not always, she will feel the bowel movement but not the push to go to the  bathroom Leakage: No Fiber supplement: Yes: Benefiber   URINATION Pain with urination: No Fully empty bladder: Yes: sometimes not Stream:  feels weaker due to having to sit on the commode longer Urgency: No Frequency: average Leakage:  none   OBJECTIVE: (objective measures completed at initial evaluation unless otherwise dated)       COGNITION:            Overall cognitive status: Within functional limits for tasks assessed                              POSTURE:  Slight increase in thoracic kyphosis   LUMBARAROM/PROM   A/PROM A/PROM  eval A/ROM 03/30/2022  Extension Decreased by 25% Decreased by 25%   (Blank rows = not tested)   LOWER EXTREMITY ROM:  Passive ROM Right eval Left eval Right  05/04/2022 Left 05/04/2022  Hip external rotation 50 50 55 55   (Blank rows = not tested)   LOWER EXTREMITY MMT:   MMT Right eval Left eval Right 05/04/2022 Left 05/04/2022  Hip flexion 4/5 4/5 4+/5 4+/5  Hip extension 4/5 4/5 4/5 4/5  Hip abduction 4/5 4/5 4/5 4/5    PELVIC MMT:   MMT eval  Internal Anal Sphincter 2/5  External Anal Sphincter 2/5  Puborectalis 2/5  (Blank rows = not tested)         PALPATION:   General  Restrictions in the abdominal scar;                  External Perineal Exam tightness along the right levator ani and perineal body                             Internal Pelvic Floor inerted therapist pinky into  the anal canal with desert harvest reveleum, tightness in the sphinter and puborectalis, trouble opening the anus with breath to push therapist finger out   TONE: increased   PROLAPSE: none   TODAY'S TREATMENT  05/04/2022 Manual: Soft tissue mobilization:manual work to the gluteals, levator ani to elongate the muscles and educated patient on how to massage her pelvic floor muscles externally Spinal mobilization: PA and rotational mobilization to T4-L5 to improve extension  Exercises: Stretches/mobility:quadruped cat camel 10x    Childs pose hold 30 sec    Quadruped thread the needle with lifting arms up in the air for full rotation 10x each side    Crossed legged sitting with trunk rotation holding 30 sec each side    Prone on elbows with breath to increase back extension    Supine with thoracic spine on foam roll performing extension 10x Strengthening: supine on foam roll with red band shoulder diagonals 10x each way, horizontal abduction 10x, then y movement 10x Therapeutic activities: Functional strengthening activities:Educated patient on how to sit on commode, perform diaphragmatic breahting, breath out to realx the anus to pus the stool out    04/27/2022 Manual: Soft tissue mobilization: to assess for dry needling; to bilateral thoracic and lumbar paraspinals, along the quadratus to elongate after dry needling.  Myofascial release:tissue rolling of the thoracic and lumbar Trigger Point Dry-Needling  Treatment instructions: Expect mild to moderate muscle soreness. S/S of pneumothorax if dry needled over a lung field, and to seek immediate medical attention should they occur. Patient verbalized understanding of these instructions and education.   Patient Consent Given: Yes Education handout provided: Previously provided Muscles treated: thoracic and lumbar multifidi, right erector spinae Electrical stimulation performed: No Parameters: N/A Treatment response/outcome: trigger  response elicited and palpable elongation of muscle   Exercises: Stretches/mobility:cross legged sitting with reaching overhead                                     Lay on rolled up towel to stretch the thoracic spine to improve rib cage mobility                                     Sitting in chair and extend back over edge of chair  Standing to stretch the lats Self-care: Discussed with patient onstaying hydrated for better bowel movements and about smooth move tea to assist with bowel movements.      03/30/2022 Manual: Soft tissue mobilization: To assess for dry needling, manual work to the right quadratus and lumbar paraspinals with addaday in prone to elongate after dry needling, sidely manual work tot he right quadratus.  Myofascial release: Spinal mobilization: PA and rotational mobilization to right side of L1-L5 Internal pelvic floor techniques: going through the anus manual work to the sphincter, along the anococcygeal ligament, mobilization of the coccyx, one finger internally and other externally fascial release along the internal anal sphincter and along th e rectum to release the restrictions.  No emotional/communication barriers or cognitive limitation. Patient is motivated to learn. Patient understands and agrees with treatment goals and plan. PT explains patient will be examined in standing, sitting, and lying down to see how their muscles and joints work. When they are ready, they will be asked to remove their underwear so PT can examine their perineum. The patient is also given the option of providing their own chaperone as one is not provided in our facility. The patient also has the right and is explained the right to defer or refuse any part of the evaluation or treatment including the internal exam. With the patient's consent, PT will use one gloved finger to gently assess the muscles of the pelvic floor, seeing how well it contracts and relaxes  and if there is muscle symmetry. After, the patient will get dressed and PT and patient will discuss exam findings and plan of care. PT and patient discuss plan of care, schedule, attendance policy and HEP activities.   Trigger Point Dry-Needling  Treatment instructions: Expect mild to moderate muscle soreness. S/S of pneumothorax if dry needled over a lung field, and to seek immediate medical attention should they occur. Patient verbalized understanding of these instructions and education.   Patient Consent Given: Yes Education handout provided: Yes Muscles treated: right quadratus, L1-L3 multifidi Electrical stimulation performed: No Parameters: N/A Treatment response/outcome: elongation of muscles and trigger point response   Neuromuscular re-education: Down training: while therapist finger in the anal canal while patient is using breath to relax the pelvic floro and push the therapist finger out of the canal.        PATIENT EDUCATION: Education details: Access Code: [email or web address], how to massage the levator ani and gluteals, toileting Person educated: Patient Education method: Explanation, Demonstration, Tactile cues, Verbal cues, and Handouts Education comprehension: verbalized understanding, returned demonstration, verbal cues required, tactile cues required, and needs further education     HOME EXERCISE PROGRAM: 05/04/2022  Access Code: [email or web address] URL: [email or web address] Date: 05/04/2022 Prepared by: Earlie Counts  Exercises - Supine Hamstring Stretch with Strap  - 1 x daily - 7 x weekly - 1 sets - 2 reps - 30 sec hold - Supine Piriformis Stretch with Leg Straight  - 1 x daily - 7 x weekly - 1 sets - 2 reps - 30 sec hold - Happy Baby with Pelvic Floor Lengthening  - 1 x daily - 7 x weekly - 1 sets - 1 reps - 30 sec hold - Cat Cow  - 1 x daily - 7 x weekly - 1 sets - 15 reps - Child's Pose Stretch  - 1 x daily - 7 x weekly - 1 sets - 1 reps - 30 sec hold - Quadruped  Full Range Thoracic Rotation with Reach  - 1 x  daily - 3 x weekly - 1 sets - 10 reps - Thoracic Extension Mobilization on Foam Roll  - 1 x daily - 3 x weekly - 1 sets - 10 reps - Supine Shoulder Horizontal Abduction with Resistance  - 1 x daily - 3 x weekly - 2 sets - 10 reps - Seated Shoulder Diagonal with Resistance  - 1 x daily - 3 x weekly - 3 sets - 10 reps ASSESSMENT:   CLINICAL IMPRESSION: Patient is a 61 y.o. female who was seen today for physical therapy treatment for anal fissure and dyssynergic defecation. Patient still has to strain for a bowel movement. She was able to demonstrate opening of the anus with breath work. She had some trigger points in the gluteals. She is working on trunk extension to reduce the compression of the internal organs when she hunches over. Patient would benefit from skilled therapy to improve pelvic floor coordination to push stool out and improve toileting.      OBJECTIVE IMPAIRMENTS decreased activity tolerance, decreased coordination, decreased endurance, decreased strength, increased fascial restrictions, and impaired tone.    ACTIVITY LIMITATIONS toileting   PARTICIPATION LIMITATIONS:  none   PERSONAL FACTORS Age, Fitness, and 1-2 comorbidities: abdominal hysterectomy and c-section  x2  are also affecting patient's functional outcome.    REHAB POTENTIAL: Excellent   CLINICAL DECISION MAKING: Stable/uncomplicated   EVALUATION COMPLEXITY: Low     GOALS: Goals reviewed with patient? Yes   SHORT TERM GOALS: Target date: 04/08/2022   Patient understands correct toileting and able to return demonstration.  Baseline: Goal status: met 04/27/2022   2.  Patient is able to perform diaphragmatic breathing to relax the pelvic floor.  Baseline:  Goal status: met 04/27/2022   3.  Patient educated on vaginal moisturizers and lubricants to improve vaginal health. Baseline:  Goal status: Met 03/30/2022    LONG TERM GOALS: Target date: 06/03/2022     Patient is independent with advanced core strengthening to improve toileting.  Baseline:  Goal status: INITIAL   2.  Patient reports she strains less than 10% due to the ability to relax the pelvic floor with bowel movement.  Baseline:  Goal status: INITIAL   3.  Anal strength >/= 3/5 to assist in pushing the stool out with greater ease.  Baseline:  Goal status: INITIAL   4.  Patient has improve mobility of lower abdominal scar so she is able to contract the upper and lower abdominals equally.  Baseline:  Goal status: INITIAL     PLAN: PT FREQUENCY: 1x/week   PT DURATION: 12 weeks   PLANNED INTERVENTIONS: Therapeutic exercises, Therapeutic activity, Neuromuscular re-education, Patient/Family education, Joint mobilization, Dry Needling, Spinal mobilization, Cryotherapy, Moist heat, scar mobilization, Taping, Biofeedback, and Manual therapy   PLAN FOR NEXT SESSION:  mobilize the lumbar and thoracic spine, work on abdominal scar and core strength    Earlie Counts, PT 05/04/22 9:28 AM

## 2022-05-20 ENCOUNTER — Encounter: Payer: BC Managed Care – PPO | Attending: Gastroenterology | Admitting: Physical Therapy

## 2022-05-20 ENCOUNTER — Encounter: Payer: Self-pay | Admitting: Physical Therapy

## 2022-05-20 DIAGNOSIS — R252 Cramp and spasm: Secondary | ICD-10-CM | POA: Diagnosis present

## 2022-05-20 DIAGNOSIS — R278 Other lack of coordination: Secondary | ICD-10-CM | POA: Diagnosis present

## 2022-05-20 NOTE — Therapy (Signed)
OUTPATIENT PHYSICAL THERAPY TREATMENT NOTE   Patient Name: Alyssa Cox MRN: [id number] DOB:[birth year], 61 y.o., female Today's Date: 05/20/2022  PCP: Alyssa Maw, MD REFERRING PROVIDER: Mauri Pole., MD  END OF SESSION:   PT End of Session - 05/20/22 1031     Visit Number 6    Date for PT Re-Evaluation 06/03/22    Authorization Type [employer]    Authorization - Visit Number 6    Authorization - Number of Visits 28    PT Start Time 5573    PT Stop Time 1115    PT Time Calculation (min) 45 min    Activity Tolerance Patient tolerated treatment well    Behavior During Therapy WFL for tasks assessed/performed             Past Medical History:  Diagnosis Date   Allergic rhinitis    Endometriosis    Past Surgical History:  Procedure Laterality Date   ABDOMINAL HYSTERECTOMY     CESAREAN SECTION     HEMORRHOID SURGERY     VASCULAR SURGERY     Patient Active Problem List   Diagnosis Date Noted   Allergic rhinitis 12/17/2021   Other fatigue 12/17/2021   Allergic reaction 11/26/2021   Xerosis cutis 11/26/2021   Gynecologic exam normal 11/19/2021   History of total abdominal hysterectomy and bilateral salpingo-oophorectomy 11/19/2021   Vaginal atrophy 11/19/2021   Glaucoma 09/23/2021   Healthcare maintenance 09/23/2021   Plantar fasciitis 09/23/2021   Need for shingles vaccine 09/23/2021   REFERRING DIAG: K60.2 Anal Fissure; K62.5 Rectal Bleeding; 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 I have not been eating as well due to kitchen remodel and traveling. I have trouble with the initial part to get the stool out.  PAIN:  Are you having pain? Yes NPRS scale: 7/10 Pain location:  thoracic Pain orientation: Left and Posterior  PAIN TYPE: pinch Pain description: intermittent  Aggravating factors: raise left arm overhead and reaching forward Relieving factors: keep arm at side     PATIENT GOALS strengthen muscles and go to the bathroom normally   BOWEL MOVEMENT Pain with bowel movement: Yes, sometimes Type of bowel movement:Type (Bristol Stool Scale) Type 1 and 4, Frequency 1-2 days , Strain Yes, and Splinting no Fully empty rectum: Yes: not always, she will feel the bowel movement but not the push to go to the  bathroom Leakage: No Fiber supplement: Yes: Benefiber   URINATION Pain with urination: No Fully empty bladder: Yes: sometimes not Stream:  feels weaker due to having to sit on the commode longer Urgency: No Frequency: average Leakage:  none   OBJECTIVE: (objective measures completed at initial evaluation unless otherwise dated)       COGNITION:            Overall cognitive status: Within functional limits for tasks assessed                              POSTURE:  Slight increase in thoracic kyphosis   LUMBARAROM/PROM   A/PROM A/PROM  eval A/ROM 03/30/2022  Extension Decreased by 25% Decreased by 25%   (Blank rows = not tested)   LOWER EXTREMITY ROM:   Passive ROM  Right eval Left eval Right  05/04/2022 Left 05/04/2022  Hip external rotation 50 50 55 55   (Blank rows = not tested)   LOWER EXTREMITY MMT:   MMT Right eval Left eval Right 05/04/2022 Left 05/04/2022  Hip flexion 4/5 4/5 4+/5 4+/5  Hip extension 4/5 4/5 4/5 4/5  Hip abduction 4/5 4/5 4/5 4/5    PELVIC MMT:   MMT eval  Internal Anal Sphincter 2/5  External Anal Sphincter 2/5  Puborectalis 2/5  (Blank rows = not tested)         PALPATION:   General  Restrictions in the abdominal scar;                  External Perineal Exam tightness along the right levator ani and perineal body                             Internal Pelvic Floor inerted therapist pinky into  the anal canal with desert harvest reveleum, tightness in the sphinter and puborectalis, trouble opening the anus with breath to push therapist finger out   TONE: increased   PROLAPSE: none   TODAY'S TREATMENT  05/20/2022 Manual: Soft tissue mobilization:to assess for dry needling and elongate the right quadratus, lumbar paraspinals and thoracic paraspinals. Left sidely with manual work along the ischial tuberosity to elongate the levator ani and around the anal sphincter.  Trigger Point Dry-Needling  Treatment instructions: Expect mild to moderate muscle soreness. S/S of pneumothorax if dry needled over a lung field, and to seek immediate medical attention should they occur. Patient verbalized understanding of these instructions and education.  Patient Consent Given: Yes Education handout provided: Previously provided Muscles treated: right lumbar multifidi, right thoracic Iliocostalis, quadratus lumborum Electrical stimulation performed: No Parameters: N/A Treatment response/outcome: elongation of muscle and trigger point response  Neuromuscular re-education: Core facilitation:Transverse abdominus contraction without hinging at the thoracic lumbar junction  Down training:diaphragmatic breathing in sidely with knees to chest to relax the pelvic floor while toileting Exercises: Stretches/mobility:quadruped cat camel 10x                                     Ardine Eng pose hold 30 sec                                     Quadruped thread the needle with lifting arms up in the air for full rotation 10x each side                                     Crossed legged sitting with trunk rotation holding 30 sec each side                                     Prone on elbows with breath to increase back extension                                     Supine with thoracic spine on foam roll performing extension 10x  Self-care: Using a tennis ball to massage the thoracic  area to improve thoracic extension  and increase diaphragm elongation.     05/04/2022 Manual: Soft tissue mobilization:manual work to the gluteals, levator ani to elongate the muscles and educated patient on how to massage her pelvic floor muscles externally Spinal mobilization: PA and rotational mobilization to T4-L5 to improve extension   Exercises: Stretches/mobility:quadruped cat camel 10x                                     Ardine Eng pose hold 30 sec                                     Quadruped thread the needle with lifting arms up in the air for full rotation 10x each side                                     Crossed legged sitting with trunk rotation holding 30 sec each side                                     Prone on elbows with breath to increase back extension                                     Supine with thoracic spine on foam roll performing extension 10x Strengthening: supine on foam roll with red band shoulder diagonals 10x each way, horizontal abduction 10x, then y movement 10x Therapeutic activities: Functional strengthening activities:Educated patient on how to sit on commode, perform diaphragmatic breahting, breath out to realx the anus to pus the stool out    04/27/2022 Manual: Soft tissue mobilization: to assess for dry needling; to bilateral thoracic and lumbar paraspinals, along the quadratus to elongate after dry needling.  Myofascial release:tissue rolling of the thoracic and lumbar Trigger Point Dry-Needling  Treatment instructions: Expect mild to moderate muscle soreness. S/S of pneumothorax if dry needled over a lung field, and to seek immediate medical attention should they occur. Patient verbalized understanding of these instructions and education.   Patient Consent Given: Yes Education handout provided: Previously provided Muscles treated: thoracic and lumbar multifidi, right erector spinae Electrical stimulation performed: No Parameters: N/A Treatment response/outcome: trigger response  elicited and palpable elongation of muscle   Exercises: Stretches/mobility:cross legged sitting with reaching overhead                                     Lay on rolled up towel to stretch the thoracic spine to improve rib cage mobility                                     Sitting in chair and extend back over edge of chair                                     Standing to stretch the lats Self-care: Discussed with patient  onstaying hydrated for better bowel movements and about smooth move tea to assist with bowel movements.        PATIENT EDUCATION: 05/19/2022 Education details: Access Code: [email or web address], how to massage the levator ani , and massage the thoracic with a tennis ball Person educated: Patient Education method: Explanation, Demonstration, Tactile cues, Verbal cues, and Handouts Education comprehension: verbalized understanding, returned demonstration, verbal cues required, tactile cues required, and needs further education     HOME EXERCISE PROGRAM: 8/3//2023 Access Code: [email or web address] URL: [email or web address] Date: 05/20/2022 Prepared by: Earlie Counts  Exercises - Supine Hamstring Stretch with Strap  - 1 x daily - 7 x weekly - 1 sets - 2 reps - 30 sec hold - Supine Piriformis Stretch with Leg Straight  - 1 x daily - 7 x weekly - 1 sets - 2 reps - 30 sec hold - Happy Baby with Pelvic Floor Lengthening  - 1 x daily - 7 x weekly - 1 sets - 1 reps - 30 sec hold - Cat Cow  - 1 x daily - 7 x weekly - 1 sets - 15 reps - Child's Pose Stretch  - 1 x daily - 7 x weekly - 1 sets - 1 reps - 30 sec hold - Quadruped Full Range Thoracic Rotation with Reach  - 1 x daily - 3 x weekly - 1 sets - 10 reps - Thoracic Extension Mobilization on Foam Roll  - 1 x daily - 3 x weekly - 1 sets - 10 reps - Supine Shoulder Horizontal Abduction with Resistance  - 1 x daily - 3 x weekly - 2 sets - 10 reps - Seated Shoulder Diagonal with Resistance  - 1 x daily - 3 x weekly - 3 sets - 10 reps -  Hooklying Transversus Abdominis Palpation  - 1 x daily - 7 x weekly - 1 sets - 10 reps ASSESSMENT:   CLINICAL IMPRESSION: Patient is a 61 y.o. female who was seen today for physical therapy treatment for anal fissure and dyssynergic defecation. Patient is able to bulge her pelvic floor with diaphragmatic breathing.  She has tightness in the levator ani and understnads how to massage the area to help with toileting. Patient has increased thoracic kyphosis making it difficult to move the diaphragm to help with toileting. She is learning how to increase her thoracic extension to assist with this. Patient would benefit from skilled therapy to improve pelvic floor coordination to push stool out and improve toileting.      OBJECTIVE IMPAIRMENTS decreased activity tolerance, decreased coordination, decreased endurance, decreased strength, increased fascial restrictions, and impaired tone.    ACTIVITY LIMITATIONS toileting   PARTICIPATION LIMITATIONS:  none   PERSONAL FACTORS Age, Fitness, and 1-2 comorbidities: abdominal hysterectomy and c-section  x2  are also affecting patient's functional outcome.    REHAB POTENTIAL: Excellent   CLINICAL DECISION MAKING: Stable/uncomplicated   EVALUATION COMPLEXITY: Low     GOALS: Goals reviewed with patient? Yes   SHORT TERM GOALS: Target date: 04/08/2022   Patient understands correct toileting and able to return demonstration.  Baseline: Goal status: met 04/27/2022   2.  Patient is able to perform diaphragmatic breathing to relax the pelvic floor.  Baseline:  Goal status: met 04/27/2022   3.  Patient educated on vaginal moisturizers and lubricants to improve vaginal health. Baseline:  Goal status: Met 03/30/2022    LONG TERM GOALS: Target date: 06/03/2022    Patient is independent with advanced core strengthening to improve toileting.  Baseline:  Goal status: ongoing 05/20/2022   2.  Patient reports she strains less than 10% due to the ability to  relax the pelvic floor with bowel movement.  Baseline: hemorrhoids are acting up Goal status: ongoing 05/20/2022   3.  Anal strength >/= 3/5 to assist in pushing the stool out with greater ease.  Baseline:  Goal status: ongoing 05/20/2022   4.  Patient has improve mobility of lower abdominal scar so she is able to contract the upper and lower abdominals equally.  Baseline:  Goal status: ongoing 05/20/2022     PLAN: PT FREQUENCY: 1x/week   PT DURATION: 12 weeks   PLANNED INTERVENTIONS: Therapeutic exercises, Therapeutic activity, Neuromuscular re-education, Patient/Family education, Joint mobilization, Dry Needling, Spinal mobilization, Cryotherapy, Moist heat, scar mobilization, Taping, Biofeedback, and Manual therapy   PLAN FOR NEXT SESSION:  mobilize the lumbar and thoracic spine, core and back strength; write renewal Earlie Counts, PT 05/20/22 11:29 AM

## 2022-05-25 ENCOUNTER — Encounter: Payer: BC Managed Care – PPO | Admitting: Physical Therapy

## 2022-05-25 ENCOUNTER — Encounter: Payer: Self-pay | Admitting: Physical Therapy

## 2022-05-25 DIAGNOSIS — R252 Cramp and spasm: Secondary | ICD-10-CM

## 2022-05-25 DIAGNOSIS — R278 Other lack of coordination: Secondary | ICD-10-CM | POA: Diagnosis not present

## 2022-05-25 NOTE — Therapy (Signed)
OUTPATIENT PHYSICAL THERAPY TREATMENT NOTE   Patient Name: Alyssa Cox MRN: [id number] DOB:1960-12-08, 61 y.o., female Today's Date: 05/25/2022  PCP: Libby Maw, MD REFERRING PROVIDER: Mauri Pole., MD  END OF SESSION:   PT End of Session - 05/25/22 0946     Visit Number 7    Date for PT Re-Evaluation 08/27/22    Authorization Type [employer]    Authorization - Visit Number 7    Authorization - Number of Visits 90    PT Start Time 0945    PT Stop Time 1025    PT Time Calculation (min) 40 min    Activity Tolerance Patient tolerated treatment well    Behavior During Therapy [facility] for tasks assessed/performed             Past Medical History:  Diagnosis Date   Allergic rhinitis    Endometriosis    Past Surgical History:  Procedure Laterality Date   ABDOMINAL HYSTERECTOMY     CESAREAN SECTION     HEMORRHOID SURGERY     VASCULAR SURGERY     Patient Active Problem List   Diagnosis Date Noted   Allergic rhinitis 12/17/2021   Other fatigue 12/17/2021   Allergic reaction 11/26/2021   Xerosis cutis 11/26/2021   Gynecologic exam normal 11/19/2021   History of total abdominal hysterectomy and bilateral salpingo-oophorectomy 11/19/2021   Vaginal atrophy 11/19/2021   Glaucoma 09/23/2021   Healthcare maintenance 09/23/2021   Plantar fasciitis 09/23/2021   Need for shingles vaccine 09/23/2021  REFERRING DIAG: K60.2 Anal Fissure; K62.5 Rectal Bleeding; 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 I had trouble going to the bathroom today. I have not taken my Metamucil. I see my doctor in September. The urine is coming out an takes longer to empty.                 PAIN:  Are you having pain? Yes NPRS  scale: 7/10 Pain location: thoracic Pain orientation: Left and Posterior  PAIN TYPE: pinch Pain description: intermittent  Aggravating factors: raise left arm overhead and reaching forward Relieving factors: keep arm at side     PATIENT GOALS strengthen muscles and go to the bathroom normally   BOWEL MOVEMENT Pain with bowel movement: Yes, sometimes Type of bowel movement:Type (Bristol Stool Scale) Type 1 and 4, Frequency 1-2 days , Strain Yes, and Splinting no Fully empty rectum: Yes: not always, she will feel the bowel movement but not the push to go to the  bathroom Leakage: No Fiber supplement: Yes: Benefiber   URINATION Pain with urination: No Fully empty bladder: Yes: sometimes not Stream:  feels weaker due to having to sit on the commode longer Urgency: No Frequency: average Leakage:  none   OBJECTIVE: (objective measures completed at initial evaluation unless otherwise dated)       COGNITION:            Overall cognitive status: Within functional limits for tasks assessed                              POSTURE:  Slight increase in thoracic kyphosis   LUMBARAROM/PROM   A/PROM A/PROM  eval A/ROM 03/30/2022 A/ROM 05/25/2022  Extension Decreased  by 25% Decreased by 25% full   (Blank rows = not tested)   LOWER EXTREMITY ROM:   Passive ROM Right eval Left eval Right  05/04/2022 Left 05/04/2022  Hip external rotation 50 50 55 55   (Blank rows = not tested)   LOWER EXTREMITY MMT:   MMT Right eval Left eval Right 05/04/2022 Left 05/04/2022 Right 05/25/2022 Left  05/25/2022  Hip flexion 4/5 4/5 4+/5 4+/5 4+/5 4+/5  Hip extension 4/5 4/5 4/5 4/5 4+/5 4+/5  Hip abduction 4/5 4/5 4/5 4/5 4+/5 4+/5    PELVIC MMT:not assessed due to fissure healing   MMT eval  Internal Anal Sphincter 2/5  External Anal Sphincter 2/5  Puborectalis 2/5  (Blank rows = not tested)         PALPATION:   General  Restrictions in the abdominal scar;                  External  Perineal Exam tightness along the right levator ani and perineal body                             Internal Pelvic Floor inerted therapist pinky into the anal canal with desert harvest reveleum, tightness in the sphinter and puborectalis, trouble opening the anus with breath to push therapist finger out   TONE: increased   PROLAPSE: none   TODAY'S TREATMENT  05/25/2022 Manual: Soft tissue mobilization:to assess for dry needling. Manual work to the quadratus and lumbar paraspinals to elongate after dry needling. Soft tissue work along the Lawyer and coccygeus externally Trigger Point Dry-Needling  Treatment instructions: Expect mild to moderate muscle soreness. S/S of pneumothorax if dry needled over a lung field, and to seek immediate medical attention should they occur. Patient verbalized understanding of these instructions and education.  Patient Consent Given: Yes Education handout provided: Previously provided Muscles treated: lumbar multifidi and left quadratus Electrical stimulation performed: No Parameters: N/A Treatment response/outcome: elongation of muscle and trigger point response  Exercises: Stretches/mobility:supine with feet on wall in squat position to stretch  the pelvic floor Stand squat to stretch the pelvic floor Strengthening:Quadruped lift opposite extremity keeping the spinal neutral and not leaning over 10x each side.  Therapeutic activities: Functional strengthening activities:reviewed toileting technique with breath, stand and move hips in circles to relax the pelvic floor for a bowel movement    05/20/2022 Manual: Soft tissue mobilization:to assess for dry needling and elongate the right quadratus, lumbar paraspinals and thoracic paraspinals. Left sidely with manual work along the ischial tuberosity to elongate the levator ani and around the anal sphincter.  Trigger Point Dry-Needling  Treatment instructions: Expect mild to moderate muscle soreness. S/S  of pneumothorax if dry needled over a lung field, and to seek immediate medical attention should they occur. Patient verbalized understanding of these instructions and education.   Patient Consent Given: Yes Education handout provided: Previously provided Muscles treated: right lumbar multifidi, right thoracic Iliocostalis, quadratus lumborum Electrical stimulation performed: No Parameters: N/A Treatment response/outcome: elongation of muscle and trigger point response  Neuromuscular re-education: Core facilitation:Transverse abdominus contraction without hinging at the thoracic lumbar junction  Down training:diaphragmatic breathing in sidely with knees to chest to relax the pelvic floor while toileting Exercises: Stretches/mobility:quadruped cat camel 10x  Ardine Eng pose hold 30 sec                                     Quadruped thread the needle with lifting arms up in the air for full rotation 10x each side                                     Crossed legged sitting with trunk rotation holding 30 sec each side                                     Prone on elbows with breath to increase back extension                                     Supine with thoracic spine on foam roll performing extension 10x   Self-care: Using a tennis ball to massage the thoracic area to improve thoracic extension and increase diaphragm elongation.     05/04/2022 Manual: Soft tissue mobilization:manual work to the gluteals, levator ani to elongate the muscles and educated patient on how to massage her pelvic floor muscles externally Spinal mobilization: PA and rotational mobilization to T4-L5 to improve extension   Exercises: Stretches/mobility:quadruped cat camel 10x                                     Ardine Eng pose hold 30 sec                                     Quadruped thread the needle with lifting arms up in the air for full rotation 10x each side                                      Crossed legged sitting with trunk rotation holding 30 sec each side                                     Prone on elbows with breath to increase back extension                                     Supine with thoracic spine on foam roll performing extension 10x Strengthening: supine on foam roll with red band shoulder diagonals 10x each way, horizontal abduction 10x, then y movement 10x Therapeutic activities: Functional strengthening activities:Educated patient on how to sit on commode, perform diaphragmatic breahting, breath out to realx the anus to pus the stool out     PATIENT EDUCATION: 05/25/2022 Education details: Access Code: [email or web address],  Person educated: Patient Education method: Explanation, Demonstration, Tactile cues, Verbal cues, and Handouts Education comprehension: verbalized understanding, returned demonstration, verbal cues required, tactile cues required, and needs further education     HOME EXERCISE PROGRAM: 05/25/2022 Access Code: [email or web address] URL: [email or web address] Date:  05/20/2022 Prepared by: Earlie Counts   Exercises - Supine Hamstring Stretch with Strap  - 1 x daily - 7 x weekly - 1 sets - 2 reps - 30 sec hold - Supine Piriformis Stretch with Leg Straight  - 1 x daily - 7 x weekly - 1 sets - 2 reps - 30 sec hold - Happy Baby with Pelvic Floor Lengthening  - 1 x daily - 7 x weekly - 1 sets - 1 reps - 30 sec hold - Cat Cow  - 1 x daily - 7 x weekly - 1 sets - 15 reps - Child's Pose Stretch  - 1 x daily - 7 x weekly - 1 sets - 1 reps - 30 sec hold - Quadruped Full Range Thoracic Rotation with Reach  - 1 x daily - 3 x weekly - 1 sets - 10 reps - Thoracic Extension Mobilization on Foam Roll  - 1 x daily - 3 x weekly - 1 sets - 10 reps - Supine Shoulder Horizontal Abduction with Resistance  - 1 x daily - 3 x weekly - 2 sets - 10 reps - Seated Shoulder Diagonal with Resistance  - 1 x daily - 3 x weekly - 3 sets - 10 reps - Hooklying Transversus Abdominis  Palpation  - 1 x daily - 7 x weekly - 1 sets - 10 reps  ASSESSMENT:   CLINICAL IMPRESSION: Patient is a 61 y.o. female who was seen today for physical therapy treatment for anal fissure and dyssynergic defecation. She had minimal to no trigger points in the lumbar multifidi and quadratus and increased in tissue mobility. She had improved mobility of the levator ani and coccygeus. Patient has increased in bilateral hip strength. She has full back extension. Patient straining with bowel movement has improved by 70%. She understands correct toileting and knows how to squat in standing or supine to stretch the muscles further to have a bowel movement. She is using vaginal moisturizers for good vaginal health. Patient would benefit from skilled therapy to improve pelvic floor coordination to push stool out and improve toileting.      OBJECTIVE IMPAIRMENTS decreased activity tolerance, decreased coordination, decreased endurance, decreased strength, increased fascial restrictions, and impaired tone.    ACTIVITY LIMITATIONS toileting   PARTICIPATION LIMITATIONS:  none   PERSONAL FACTORS Age, Fitness, and 1-2 comorbidities: abdominal hysterectomy and c-section  x2  are also affecting patient's functional outcome.    REHAB POTENTIAL: Excellent   CLINICAL DECISION MAKING: Stable/uncomplicated   EVALUATION COMPLEXITY: Low     GOALS: Goals reviewed with patient? Yes   SHORT TERM GOALS: Target date: 04/08/2022   Patient understands correct toileting and able to return demonstration.  Baseline: Goal status: met 04/27/2022   2.  Patient is able to perform diaphragmatic breathing to relax the pelvic floor.  Baseline:  Goal status: met 04/27/2022   3.  Patient educated on vaginal moisturizers and lubricants to improve vaginal health. Baseline:  Goal status: Met 03/30/2022    LONG TERM GOALS: Target date: 06/03/2022    Patient is independent with advanced core strengthening to improve toileting.   Baseline:  Goal status: ongoing 05/25/2022   2.  Patient reports she strains less than 10% due to the ability to relax the pelvic floor with bowel movement.  Baseline: strains 70% less Goal status: ongoing 05/25/2022   3.  Anal strength >/= 3/5 to assist in pushing the stool out with greater ease.  Baseline: fissure is still healing Goal status:  ongoing 05/25/2022   4.  Patient has improve mobility of lower abdominal scar so she is able to contract the upper and lower abdominals equally.  Baseline:  Goal status: ongoing 05/20/2022     PLAN: PT FREQUENCY: 1x/week   PT DURATION: 12 weeks   PLANNED INTERVENTIONS: Therapeutic exercises, Therapeutic activity, Neuromuscular re-education, Patient/Family education, Joint mobilization, Dry Needling, Spinal mobilization, Cryotherapy, Moist heat, scar mobilization, Taping, Biofeedback, and Manual therapy   PLAN FOR NEXT SESSION:  mobilize the lumbar and thoracic spine, core and back strength    Earlie Counts, PT 05/25/22 10:34 AM

## 2022-06-03 ENCOUNTER — Encounter: Payer: Self-pay | Admitting: Physical Therapy

## 2022-06-03 ENCOUNTER — Encounter: Payer: BC Managed Care – PPO | Admitting: Physical Therapy

## 2022-06-03 DIAGNOSIS — R278 Other lack of coordination: Secondary | ICD-10-CM

## 2022-06-03 DIAGNOSIS — R252 Cramp and spasm: Secondary | ICD-10-CM
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
## 2022-06-08 ENCOUNTER — Encounter: Payer: BC Managed Care – PPO | Admitting: Gastroenterology

## 2022-06-12 ENCOUNTER — Other Ambulatory Visit: Payer: Self-pay | Admitting: Family Medicine

## 2022-06-12 DIAGNOSIS — J309 Allergic rhinitis, unspecified: Secondary | ICD-10-CM

## 2022-06-14 ENCOUNTER — Encounter: Payer: BC Managed Care – PPO | Admitting: Gastroenterology

## 2022-06-22 ENCOUNTER — Encounter: Payer: Self-pay | Admitting: Family Medicine

## 2022-06-22 ENCOUNTER — Ambulatory Visit (INDEPENDENT_AMBULATORY_CARE_PROVIDER_SITE_OTHER): Payer: BC Managed Care – PPO | Admitting: Family Medicine

## 2022-06-22 ENCOUNTER — Encounter: Payer: BC Managed Care – PPO | Admitting: Physical Therapy

## 2022-06-22 ENCOUNTER — Other Ambulatory Visit: Payer: BC Managed Care – PPO

## 2022-06-22 VITALS — BP 110/66 | HR 59 | Temp 97.4°F | Ht 65.0 in | Wt 135.6 lb

## 2022-06-22 DIAGNOSIS — Z23 Encounter for immunization: Secondary | ICD-10-CM | POA: Diagnosis not present

## 2022-06-22 DIAGNOSIS — D72819 Decreased white blood cell count, unspecified: Secondary | ICD-10-CM | POA: Insufficient documentation

## 2022-06-22 DIAGNOSIS — M5431 Sciatica, right side: Secondary | ICD-10-CM

## 2022-06-22 LAB — CBC WITH DIFFERENTIAL/PLATELET
Basophils Absolute: 0.1 10*3/uL (ref 0.0–0.1)
Basophils Relative: 1.9 % (ref 0.0–3.0)
Eosinophils Absolute: 0.1 10*3/uL (ref 0.0–0.7)
Eosinophils Relative: 2.9 % (ref 0.0–5.0)
HCT: 41 % (ref 36.0–46.0)
Hemoglobin: 13.7 g/dL (ref 12.0–15.0)
Lymphocytes Relative: 38.3 % (ref 12.0–46.0)
Lymphs Abs: 1.3 10*3/uL (ref 0.7–4.0)
MCHC: 33.5 g/dL (ref 30.0–36.0)
MCV: 94.4 fl (ref 78.0–100.0)
Monocytes Absolute: 0.3 10*3/uL (ref 0.1–1.0)
Monocytes Relative: 8.1 % (ref 3.0–12.0)
Neutro Abs: 1.7 10*3/uL (ref 1.4–7.7)
Neutrophils Relative %: 48.8 % (ref 43.0–77.0)
Platelets: 203 10*3/uL (ref 150.0–400.0)
RBC: 4.35 Mil/uL (ref 3.87–5.11)
RDW: 12.6 % (ref 11.5–15.5)
WBC: 3.4 10*3/uL — ABNORMAL LOW (ref 4.0–10.5)

## 2022-06-22 MED ORDER — PREDNISONE 10 MG (21) PO TBPK: ORAL_TABLET | ORAL | 0 refills | Status: DC

## 2022-06-22 MED ORDER — METHOCARBAMOL 500 MG PO TABS: 500.0000 mg | ORAL_TABLET | Freq: Three times a day (TID) | ORAL | 0 refills | Status: DC | PRN

## 2022-06-22 NOTE — Progress Notes (Signed)
Established Patient Office Visit  Subjective   Patient ID: Alyssa Cox, female    DOB: [birth year]  Age: 61 y.o. MRN: [id number]  Chief Complaint  Patient presents with   Pain    Right side sciatic nerve pain x 2 days nothing helping.     HPI 1 day history of pain in her right calf rating up into the buttock area this was noted after she had been working in her garden.  There is no numbness or tingling or change in bowel or bladder function.  She is taking an old prescription of Robaxin helps some.  She has a history of sciatica.  Denies back pain.  She had been given injections into her back for the sciatica and they were not helpful.  She is here today for follow-up of leukopenia.  She feels well.  Denies weight loss or night sweats.    Review of Systems  Constitutional: Negative.   HENT: Negative.    Eyes:  Negative for blurred vision, discharge and redness.  Respiratory: Negative.    Cardiovascular: Negative.   Gastrointestinal:  Negative for abdominal pain.  Genitourinary: Negative.  Negative for frequency, hematuria and urgency.  Musculoskeletal: Negative.  Negative for back pain and myalgias.  Skin:  Negative for rash.  Neurological:  Negative for tingling, loss of consciousness and weakness.  Endo/Heme/Allergies:  Negative for polydipsia.  Psychiatric/Behavioral: Negative.        Objective:     BP 110/66 (BP Location: Right Arm, Patient Position: Sitting, Cuff Size: Normal)   Pulse (!) 59   Temp (!) 97.4 F (36.3 C) (Temporal)   Ht '5\' 5"'$  (1.651 m)   Wt 135 lb 9.6 oz (61.5 kg)   SpO2 100%   BMI 22.57 kg/m  BP Readings from Last 3 Encounters:  06/22/22 110/66  04/14/22 121/68  03/02/22 102/60   Wt Readings from Last 3 Encounters:  06/22/22 135 lb 9.6 oz (61.5 kg)  04/14/22 137 lb (62.1 kg)  03/02/22 134 lb (60.8 kg)      Physical Exam Constitutional:      General: She is not in acute distress.    Appearance: Normal appearance. She is not  ill-appearing, toxic-appearing or diaphoretic.  HENT:     Head: Normocephalic and atraumatic.     Right Ear: External ear normal.     Left Ear: External ear normal.  Eyes:     General: No scleral icterus.       Right eye: No discharge.        Left eye: No discharge.     Extraocular Movements: Extraocular movements intact.     Conjunctiva/sclera: Conjunctivae normal.  Cardiovascular:     Rate and Rhythm: Normal rate and regular rhythm.  Pulmonary:     Effort: Pulmonary effort is normal. No respiratory distress.     Breath sounds: Normal breath sounds.  Musculoskeletal:     Lumbar back: Tenderness present. Decreased range of motion. Positive right straight leg raise test. Negative left straight leg raise test.     Comments: Tenderness to palpation of right sciatic notch.  Skin:    General: Skin is warm and dry.  Neurological:     Mental Status: She is alert and oriented to person, place, and time.     Motor: No weakness.     Deep Tendon Reflexes:     Reflex Scores:      Patellar reflexes are 1+ on the right side and 1+ on the left side.  Achilles reflexes are 1+ on the right side and 1+ on the left side. Psychiatric:        Mood and Affect: Mood normal.        Behavior: Behavior normal.      No results found for any visits on 06/22/22.    The ASCVD Risk score (Arnett DK, et al., 2019) failed to calculate for the following reasons:   The valid HDL cholesterol range is 20 to 100 mg/dL    Assessment & Plan:   Problem List Items Addressed This Visit       Nervous and Auditory   Sciatica of right side   Relevant Medications   predniSONE (STERAPRED UNI-PAK 21 TAB) 10 MG (21) TBPK tablet   methocarbamol (ROBAXIN) 500 MG tablet     Other   Leukopenia - Primary   Relevant Orders   CBC with Differential/Platelet   Flu vaccine need   Relevant Orders   Flu Vaccine QUAD 6+ mos PF IM (Fluarix Quad PF)    Return if symptoms worsen or fail to improve.   Sports  medicine referral if not improving after prednisone..  Patient was given given exercises to do.  We will continue to follow leukopenia. Libby Maw, MD

## 2022-06-29 ENCOUNTER — Telehealth: Payer: Self-pay | Admitting: Family Medicine

## 2022-06-29 NOTE — Telephone Encounter (Signed)
Caller Name: Alyssa Cox Call back phone #: [phone]  Reason for Call: Pt is giving an update after starting prednisone. The pain is doing better in her back, however she is still sciatic in foot. 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 Wanting to start up again

## 2022-06-30 NOTE — Telephone Encounter (Signed)
Spoke with patient calling to see if there was something that she could take to help with her osteoporosis states that a few years ago back in her home town her PCP had prescribed a pill that she took once a month patient does not remember the name. She also states that she would like referral for bone density have not had a recently. Sciatic nerve pain in foot still present and painful how long will it for prednisone to help?

## 2022-07-01 NOTE — Telephone Encounter (Signed)
Called patient to inform of responses from Dr. Ethelene Hal no answer [facility] and also to call and schedule virtual.    Appointment scheduled

## 2022-07-05 ENCOUNTER — Telehealth (INDEPENDENT_AMBULATORY_CARE_PROVIDER_SITE_OTHER): Payer: BC Managed Care – PPO | Admitting: Family Medicine

## 2022-07-05 ENCOUNTER — Encounter: Payer: Self-pay | Admitting: Family Medicine

## 2022-07-05 VITALS — Ht 65.0 in

## 2022-07-05 DIAGNOSIS — Z8739 Personal history of other diseases of the musculoskeletal system and connective tissue: Secondary | ICD-10-CM

## 2022-07-05 NOTE — Progress Notes (Signed)
Established Patient Office Visit  Subjective   Patient ID: Alyssa Cox, female    DOB: [birth year]  Age: 61 y.o. MRN: [id number]  Chief Complaint  Patient presents with   Advice Only    Discuss osteoporosis and treatment     HPI to discuss possible history of osteoporosis.  Distant history of scan that showed near osteoporosis.  Sounds as though she took Boniva briefly but then saw a homeopathic provider who recommended against it.  She has been taking vitamin D3 but only recently started calcium.  Osteoporosis if present has not been formally treated.  She has no dental problems.  She sees the dentist regularly    Review of Systems  Constitutional:  Negative for chills, diaphoresis, malaise/fatigue and weight loss.  HENT: Negative.    Eyes: Negative.  Negative for blurred vision and double vision.  Cardiovascular:  Negative for chest pain.  Gastrointestinal:  Negative for abdominal pain.  Genitourinary: Negative.   Musculoskeletal:  Negative for falls and myalgias.  Neurological:  Negative for speech change, loss of consciousness and weakness.  Psychiatric/Behavioral: Negative.        Objective:     Ht '5\' 5"'$  (1.651 m)   BMI 22.57 kg/m    Physical Exam Constitutional:      Appearance: Normal appearance.  Pulmonary:     Effort: Pulmonary effort is normal.  Neurological:     Mental Status: She is alert and oriented to person, place, and time.  Psychiatric:        Mood and Affect: Mood normal.        Behavior: Behavior normal.      No results found for any visits on 07/05/22.    The ASCVD Risk score (Arnett DK, et al., 2019) failed to calculate for the following reasons:   The valid HDL cholesterol range is 20 to 100 mg/dL    Assessment & Plan:   Problem List Items Addressed This Visit   None Visit Diagnoses     History of osteopenia    -  Primary   Relevant Orders   DG Bone Density       Return Call us within a few weeks if scan has not been  scheduled..  We will send for bone scan.  Discussed using a bisphosphonate such as alendronate.  Discussed side effects of osteonecrosis.  Recommended vitamin D3 800 international units with 1200 mg of calcium daily.  Unfortunately she seems to be lactose intolerant.  Recommended a calcium sources such as Os-Cal and/or Tums.  Libby Maw, MD   Virtual Visit via Telephone Note  I connected with Alyssa Cox on 07/05/22 at 10:20 AM EDT by telephone and verified that I am speaking with the correct person using two identifiers.  Location: Patient: home with her husband.  Provider: work   I discussed the limitations, risks, security and privacy concerns of performing an evaluation and management service by telephone and the availability of in person appointments. I also discussed with the patient that there may be a patient responsible charge related to this service. The patient expressed understanding and agreed to proceed.   History of Present Illness:    Observations/Objective:   Assessment and Plan:   Follow Up Instructions:    I discussed the assessment and treatment plan with the patient. The patient was provided an opportunity to ask questions and all were answered. The patient agreed with the plan and demonstrated an understanding of the instructions.   The  patient was advised to call back or seek an in-person evaluation if the symptoms worsen or if the condition fails to improve as anticipated.  I provided 15 minutes of non-face-to-face time during this encounter.   Libby Maw, MD   Interactive video and audio telecommunications were attempted between myself and the patient. However they failed due to the patient having technical difficulties or not having access to video capability. We continued and completed with audio only.

## 2022-07-13 ENCOUNTER — Ambulatory Visit (HOSPITAL_BASED_OUTPATIENT_CLINIC_OR_DEPARTMENT_OTHER)
Admission: RE | Admit: 2022-07-13 | Discharge: 2022-07-13 | Disposition: A | Discharge status: Home / Self Care | Payer: BC Managed Care – PPO | Source: Ambulatory Visit | Attending: Family Medicine | Admitting: Family Medicine

## 2022-07-13 DIAGNOSIS — Z8739 Personal history of other diseases of the musculoskeletal system and connective tissue: Secondary | ICD-10-CM | POA: Diagnosis not present

## 2022-07-20 ENCOUNTER — Telehealth: Payer: Self-pay | Admitting: Gastroenterology

## 2022-07-20 NOTE — Telephone Encounter (Signed)
Returned call to patient. She states that last Thursday and Friday she passed a hard stool and the rectal pain returned. Pt thinks that she may have another fissure. Pt states that her rectum was extremely painful and she noticed some rectal bleeding, but the bleeding has subsided. Pt states that she is taking Mylanta, Dulcolax PRN and 1 capful of Miralax daily to try and keep stools soft. Pt states that she has Nitroglycerin ointment but she is not sure if that will work anymore since she has used it for so long. Please advise if you would like patient to continue Nitroglycerin ointment or if you would like to prescribe an alternative. Pt rescheduled her hemorrhoid banding appt to 09/13/22. Thanks

## 2022-07-20 NOTE — Telephone Encounter (Signed)
Inbound call from patient requesting medication refill for nitroglycerin. Patient also is requesting to speak with a nurse in regards to having a tare and also reschedule banding as well . Please give a call to further advise.  Thank you

## 2022-07-21 MED ORDER — AMBULATORY NON FORMULARY MEDICATION: 0 refills | Status: AC

## 2022-07-21 NOTE — Telephone Encounter (Signed)
[place] and spoke with [place]. I gave her a verbal prescription for Diltiazem cream. They will contact patient once RX is ready.  Lm on vm for patient to return call. Appt has been changed to follow up appt.

## 2022-07-21 NOTE — Telephone Encounter (Signed)
We can switch to diltiazem 2% cream 3 times daily small pea-sized amount per rectum.  Agree with other recommendations.  Please cancel the hemorrhoidal banding appointment and schedule office follow-up visit either with me or app next available.  Thank you

## 2022-07-22 NOTE — Telephone Encounter (Signed)
Returned call to patient. I explained to her that the Diltiazem cream has to be compounded that is why it was sent to [facility]. Pt is aware that her appt is still scheduled but it will be a follow up instead of a banding appt. Pt verbalized understanding and had no concerns at the end of the call.

## 2022-07-22 NOTE — Telephone Encounter (Signed)
Inbound call from patient states she does not use [facility]. The one she uses is [facility] at [address] [place]. She is requesting a call back. Thank you.

## 2022-08-24 ENCOUNTER — Encounter: Payer: BC Managed Care – PPO | Admitting: Gastroenterology

## 2022-09-03 ENCOUNTER — Telehealth: Payer: Self-pay

## 2022-09-03 NOTE — Telephone Encounter (Signed)
Please advise message below  °
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
## 2022-09-03 NOTE — Telephone Encounter (Signed)
Patient aware and will try recommended OTC meds agrees to give [place] a call if no improvement.

## 2022-09-13 ENCOUNTER — Ambulatory Visit (INDEPENDENT_AMBULATORY_CARE_PROVIDER_SITE_OTHER): Payer: BC Managed Care – PPO | Admitting: Gastroenterology

## 2022-09-13 ENCOUNTER — Encounter: Payer: Self-pay | Admitting: Gastroenterology

## 2022-09-13 VITALS — BP 110/72 | HR 71 | Ht 65.0 in | Wt 135.0 lb

## 2022-09-13 DIAGNOSIS — K5904 Chronic idiopathic constipation: Secondary | ICD-10-CM | POA: Diagnosis not present

## 2022-09-13 DIAGNOSIS — K6289 Other specified diseases of anus and rectum: Secondary | ICD-10-CM

## 2022-09-13 DIAGNOSIS — K625 Hemorrhage of anus and rectum: Secondary | ICD-10-CM

## 2022-09-13 DIAGNOSIS — K601 Chronic anal fissure: Secondary | ICD-10-CM | POA: Diagnosis not present

## 2022-09-13 DIAGNOSIS — K624 Stenosis of anus and rectum: Secondary | ICD-10-CM | POA: Diagnosis not present

## 2022-09-13 MED ORDER — DICYCLOMINE HCL 10 MG PO CAPS: 10.0000 mg | ORAL_CAPSULE | Freq: Three times a day (TID) | ORAL | 11 refills | Status: DC | PRN

## 2022-09-13 NOTE — Progress Notes (Unsigned)
Alyssa Cox    [id number]    1961-01-16  Primary Care Physician:Kremer, Talmadge Coventry, MD  Referring Physician: Mliss Sax, MD [address] [place],  [place] [address]   Chief complaint:  Anal fissure, anorectal discomfort  HPI: 61 year old very pleasant female here for follow-up visit for anal fissure. 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 When she had rectal exam during pelvic floor physical therapy, she felt tear in her anorectal area after physical therapy. Her stool caliber is thin, she does not feel like she is evacuating completely She is using rectal nitroglycerin with some improvement She is taking daily stool softener, did not tolerate fiber was causing excessive bloating  Colonoscopy February 11, 2022 - One 15 mm polyp in the ascending colon, removed with a cold snare. Resected and retrieved. - Non-bleeding external and internal hemorrhoids. - The examination was otherwise normal.   Outpatient Encounter Medications as of 09/13/2022  Medication Sig   albuterol (VENTOLIN HFA) 108 (90 Base) MCG/ACT inhaler Inhale 1-2 puffs into the lungs every 6 (six) hours as needed for wheezing or shortness of breath.   AMBULATORY NON FORMULARY MEDICATION Medication Name: Diltiazem 2% with lidocaine 2% cream.  Apply a pea sized amount per rectum 3 times a day [facility]   cetirizine (ZYRTEC) 10 MG tablet Take 10 mg by mouth daily.   cholecalciferol (VITAMIN D3) 25 MCG (1000 UNIT) tablet Take 1,000 Units by mouth daily.   dicyclomine (BENTYL) 10 MG capsule Take 1 capsule (10 mg total) by mouth every 8 (eight) hours as needed for spasms.   Estradiol (VAGIFEM) 10 MCG TABS vaginal tablet Place 1 tablet (10 mcg total) vaginally 2 (two) times a week.   fluticasone (FLONASE) 50 MCG/ACT nasal spray SPRAY 2 SPRAYS INTO EACH  NOSTRIL EVERY DAY   hydrocortisone 2.5 % ointment SMARTSIG:sparingly Topical Twice Daily   latanoprost (XALATAN) 0.005 % ophthalmic solution latanoprost 0.005 % eye drops  INSTILL 1 DROP INTO BOTH EYES AT BEDTIME   methocarbamol (ROBAXIN) 500 MG tablet Take 1 tablet (500 mg total) by mouth every 8 (eight) hours as needed for muscle spasms.   Multiple Vitamins-Minerals (MULTIVITAMIN WOMEN 50+ PO) Take by mouth.   Biotin w/ Vitamins C & E (HAIR/SKIN/NAILS PO) Take by mouth. (Patient not taking: Reported on 07/05/2022)   calcium carbonate (OS-CAL - DOSED IN MG OF ELEMENTAL CALCIUM) 1250 (500 Ca) MG tablet Take 1 tablet by mouth. (Patient not taking: Reported on 09/13/2022)   No facility-administered encounter medications on file as of 09/13/2022.    Allergies as of 09/13/2022 - Review Complete 09/13/2022  Allergen Reaction Noted   Morphine Itching 02/11/2022    Past Medical History:  Diagnosis Date   Allergic rhinitis    Anal fissure    Chronic idiopathic constipation    Colon polyps    Endometriosis     Past Surgical History:  Procedure Laterality Date   ABDOMINAL HYSTERECTOMY     CESAREAN SECTION     HEMORRHOID SURGERY     VASCULAR SURGERY      Family History  Problem Relation Age of Onset   Ovarian cancer Mother        mets to eye   Hypertension Mother    Stroke Father    Hypertension Father    Hypertension Brother    Anuerysm  Maternal Grandmother    Colon cancer Maternal Grandfather    Stroke Maternal Grandfather    Asthma Daughter    Asthma Son    Breast cancer Neg Hx    Esophageal cancer Neg Hx    Rectal cancer Neg Hx    Stomach cancer Neg Hx     Social History   Socioeconomic History   Marital status: Married    Spouse name: Not on file   Number of children: 2   Years of education: Not on file   Highest education level: Not on file  Occupational History   Not on file  Tobacco Use   Smoking status: Former    Types: Cigarettes    Quit date:  10/03/2013    Years since quitting: 8.9   Smokeless tobacco: Never  Vaping Use   Vaping Use: Never used  Substance and Sexual Activity   Alcohol use: Yes    Alcohol/week: 2.0 standard drinks of alcohol    Types: 2 Glasses of wine per week    Comment: 1-2 drinks a week   Drug use: Never   Sexual activity: Yes    Birth control/protection: Surgical  Other Topics Concern   Not on file  Social History Narrative   Not on file   Social Determinants of Health   Financial Resource Strain: Not on file  Food Insecurity: Not on file  Transportation Needs: Not on file  Physical Activity: Not on file  Stress: Not on file  Social Connections: Not on file  Intimate Partner Violence: Not on file      Review of systems: All other review of systems negative except as mentioned in the HPI.   Physical Exam: Vitals:   09/13/22 1525  BP: 110/72  Pulse: 71   Body mass index is 22.47 kg/m. 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 The patient agreed with the plan and demonstrated an understanding of the instructions.  Iona Beard , MD     CC: Mliss Sax,*

## 2022-09-13 NOTE — Patient Instructions (Signed)
Use internal rectal dilators.   Take over the counter miralax daily in the morning and dulcolax at bedtime.   Apply nitroglycerin ointment 0.125 % 3-4 times daily per rectum.   We have sent the following medications to your pharmacy for you to pick up at your convenience: dicyclomine.   The [facility] providers would like to encourage you to use [employer] to communicate with providers for non-urgent requests or questions.  Due to long hold times on the telephone, sending your provider a message by [facility] may be a faster and more efficient way to get a response.  Please allow 48 business hours for a response.  Please remember that this is for non-urgent requests.   Due to recent changes in healthcare laws, you may see the results of your imaging and laboratory studies on MyChart before your provider has had a chance to review them.  We understand that in some cases there may be results that are confusing or concerning to you. Not all laboratory results come back in the same time frame and the provider may be waiting for multiple results in order to interpret others.  Please give [place] 48 hours in order for your provider to thoroughly review all the results before contacting the office for clarification of your results.

## 2022-09-14 ENCOUNTER — Encounter: Payer: Self-pay | Admitting: Gastroenterology

## 2022-10-18 DIAGNOSIS — T148XXA Other injury of unspecified body region, initial encounter: Secondary | ICD-10-CM

## 2022-10-18 HISTORY — DX: Other injury of unspecified body region, initial encounter: T14.8XXA

## 2022-11-03 ENCOUNTER — Other Ambulatory Visit: Payer: Self-pay | Admitting: Obstetrics and Gynecology

## 2022-11-03 DIAGNOSIS — Z1231 Encounter for screening mammogram for malignant neoplasm of breast: Secondary | ICD-10-CM

## 2022-11-16 ENCOUNTER — Telehealth: Payer: Self-pay | Admitting: Family Medicine

## 2022-11-16 NOTE — Telephone Encounter (Signed)
Pt has back pain and is wanting a referral to a specialist. I offered an appointment, she declined. Pt at [phone] [facility])

## 2022-11-16 NOTE — Telephone Encounter (Signed)
Please advise message below patient requesting referral to specialist for continued back pains. Okay for referral or will patient need to come in, last OV 06/22/22 virtual visit to discuss osteopenia on 07/05/22

## 2022-11-18 ENCOUNTER — Ambulatory Visit (HOSPITAL_BASED_OUTPATIENT_CLINIC_OR_DEPARTMENT_OTHER)
Admission: RE | Admit: 2022-11-18 | Discharge: 2022-11-18 | Disposition: A | Discharge status: Home / Self Care | Payer: BC Managed Care – PPO | Source: Ambulatory Visit | Attending: Family Medicine | Admitting: Family Medicine

## 2022-11-18 ENCOUNTER — Ambulatory Visit (INDEPENDENT_AMBULATORY_CARE_PROVIDER_SITE_OTHER): Payer: BC Managed Care – PPO | Admitting: Family Medicine

## 2022-11-18 ENCOUNTER — Encounter: Payer: Self-pay | Admitting: Family Medicine

## 2022-11-18 VITALS — BP 130/84 | HR 70 | Temp 96.9°F | Wt 137.8 lb

## 2022-11-18 VITALS — BP 128/72 | Ht 65.0 in | Wt 137.0 lb

## 2022-11-18 DIAGNOSIS — S161XXA Strain of muscle, fascia and tendon at neck level, initial encounter: Secondary | ICD-10-CM

## 2022-11-18 DIAGNOSIS — S39012A Strain of muscle, fascia and tendon of lower back, initial encounter: Secondary | ICD-10-CM | POA: Diagnosis not present

## 2022-11-18 DIAGNOSIS — M5416 Radiculopathy, lumbar region: Secondary | ICD-10-CM | POA: Insufficient documentation

## 2022-11-18 MED ORDER — PREDNISONE 20 MG PO TABS: 20.0000 mg | ORAL_TABLET | Freq: Two times a day (BID) | ORAL | 0 refills | Status: AC

## 2022-11-18 MED ORDER — METHOCARBAMOL 750 MG PO TABS: 750.0000 mg | ORAL_TABLET | Freq: Three times a day (TID) | ORAL | 0 refills | Status: DC | PRN

## 2022-11-18 NOTE — Progress Notes (Signed)
  Alyssa Cox - 62 y.o. female MRN [id number]  Date of birth: 04-Jun-1961  SUBJECTIVE:  Including CC & ROS.  No chief complaint on file.   Alyssa Cox is a 62 y.o. female that is presenting with lower back pain and right-sided radicular pain of the lower right leg.  She has had low back pain previously.  She recently had an episode where she fell down the stairs.  She is having some tightness in the lower back with flexion and extension.  No history of surgery.   Review of Systems See HPI   HISTORY: Past Medical, Surgical, Social, and Family History Reviewed & Updated per EMR.   Pertinent Historical Findings include:  Past Medical History:  Diagnosis Date   Allergic rhinitis    Anal fissure    Chronic idiopathic constipation    Colon polyps    Endometriosis     Past Surgical History:  Procedure Laterality Date   ABDOMINAL HYSTERECTOMY     CESAREAN SECTION     HEMORRHOID SURGERY     VASCULAR SURGERY       PHYSICAL EXAM:  VS: BP 128/72   Ht '5\' 5"'$  (1.651 m)   Wt 137 lb (62.1 kg)   BMI 22.80 kg/m  Physical Exam Gen: NAD, alert, cooperative with exam, well-appearing MSK:  Neurovascularly intact       ASSESSMENT & PLAN:   Lumbar radiculopathy Acutely occurring.  Has low back pain as well as the radicular pattern down the right leg. - Counseled on home exercise therapy and supportive care. -Thoracic and lumbar x-ray. -Counseled on compression. -Could consider physical therapy

## 2022-11-18 NOTE — Progress Notes (Signed)
Established Patient Office Visit   Subjective:  Patient ID: Alyssa Cox, female    DOB: 04/28/1961  Age: 62 y.o. MRN: [id number]  Chief Complaint  Patient presents with   Acute Visit    Pulled muscle in back last Friday-spasms in mid to lower back now    HPI Encounter Diagnoses  Name Primary?   Strain of lumbar region, initial encounter Yes   Strain of neck muscle, initial encounter    Approximately 2 weeks ago patient slipped going down the stairs and grabbed a railing with her right arm to break her fall.  Since that time she has been experiencing some pain in her neck and right shoulder.  Approximately 1 week ago she bent over to pick a pebble up off the ground and developed right lower back pain.  This pain has been radiating.  She has been experiencing tightness in her right calf.  She has also been experiencing tightness in her right arm.  She denies numbness or tingling.  She feels some weakness in her right hand.   Review of Systems  Constitutional: Negative.   HENT: Negative.    Eyes:  Negative for blurred vision, discharge and redness.  Respiratory: Negative.    Cardiovascular: Negative.   Gastrointestinal:  Negative for abdominal pain.  Genitourinary: Negative.   Musculoskeletal:  Positive for back pain, joint pain, myalgias and neck pain.  Skin:  Negative for rash.  Neurological:  Positive for weakness. Negative for tingling and loss of consciousness.  Endo/Heme/Allergies:  Negative for polydipsia.     Current Outpatient Medications:    AMBULATORY NON FORMULARY MEDICATION, Medication Name: Diltiazem 2% with lidocaine 2% cream.  Apply a pea sized amount per rectum 3 times a day [place], Disp: 30 g, Rfl: 0   Calcipotriene 0.005 % solution, Apply topically., Disp: , Rfl:    cetirizine (ZYRTEC) 10 MG tablet, Take 10 mg by mouth daily., Disp: , Rfl:    cholecalciferol (VITAMIN D3) 25 MCG (1000 UNIT) tablet, Take 1,000 Units by mouth daily., Disp: , Rfl:     fluticasone (FLONASE) 50 MCG/ACT nasal spray, SPRAY 2 SPRAYS INTO EACH NOSTRIL EVERY DAY, Disp: 48 mL, Rfl: 2   hydrocortisone 2.5 % ointment, SMARTSIG:sparingly Topical Twice Daily, Disp: , Rfl:    latanoprost (XALATAN) 0.005 % ophthalmic solution, latanoprost 0.005 % eye drops  INSTILL 1 DROP INTO BOTH EYES AT BEDTIME, Disp: , Rfl:    methocarbamol (ROBAXIN-750) 750 MG tablet, Take 1 tablet (750 mg total) by mouth every 8 (eight) hours as needed for muscle spasms., Disp: 60 tablet, Rfl: 0   Multiple Vitamins-Minerals (MULTIVITAMIN WOMEN 50+ PO), Take by mouth., Disp: , Rfl:    predniSONE (DELTASONE) 20 MG tablet, Take 1 tablet (20 mg total) by mouth 2 (two) times daily with a meal for 7 days., Disp: 14 tablet, Rfl: 0   albuterol (VENTOLIN HFA) 108 (90 Base) MCG/ACT inhaler, Inhale 1-2 puffs into the lungs every 6 (six) hours as needed for wheezing or shortness of breath. (Patient not taking: Reported on 11/18/2022), Disp: 8 g, Rfl: 2   Biotin w/ Vitamins C & E (HAIR/SKIN/NAILS PO), Take by mouth. (Patient not taking: Reported on 07/05/2022), Disp: , Rfl:    calcium carbonate (OS-CAL - DOSED IN MG OF ELEMENTAL CALCIUM) 1250 (500 Ca) MG tablet, Take 1 tablet by mouth. (Patient not taking: Reported on 09/13/2022), Disp: , Rfl:    Estradiol (VAGIFEM) 10 MCG TABS vaginal tablet, Place 1 tablet (10 mcg  total) vaginally 2 (two) times a week. (Patient not taking: Reported on 11/18/2022), Disp: 8 tablet, Rfl: 11   Objective:     BP 130/84 (BP Location: Right Arm)   Pulse 70   Temp (!) 96.9 F (36.1 C) (Temporal)   Wt 137 lb 12.8 oz (62.5 kg)   SpO2 100%   BMI 22.93 kg/m    Physical Exam Constitutional:      General: She is not in acute distress.    Appearance: Normal appearance. She is not ill-appearing, toxic-appearing or diaphoretic.  HENT:     Head: Normocephalic and atraumatic.     Right Ear: External ear normal.     Left Ear: External ear normal.  Eyes:     General: No scleral icterus.        Right eye: No discharge.        Left eye: No discharge.     Extraocular Movements: Extraocular movements intact.     Conjunctiva/sclera: Conjunctivae normal.  Pulmonary:     Effort: Pulmonary effort is normal. No respiratory distress.  Musculoskeletal:     Right shoulder: No tenderness or bony tenderness. Normal range of motion.     Left shoulder: No tenderness or bony tenderness. Normal range of motion.     Cervical back: No spasms, tenderness or bony tenderness. No pain with movement. Normal range of motion.     Lumbar back: No spasms, tenderness or bony tenderness. Decreased range of motion. Negative right straight leg raise test and negative left straight leg raise test.     Right hip: No tenderness. Decreased range of motion.     Left hip: No tenderness. Normal range of motion.     Comments: Weak positive Spurling's with rotation to the right.  Skin:    General: Skin is warm and dry.  Neurological:     Mental Status: She is alert and oriented to person, place, and time.     Motor: No weakness.  Psychiatric:        Mood and Affect: Mood normal.        Behavior: Behavior normal.      No results found for any visits on 11/18/22.    The ASCVD Risk score (Arnett DK, et al., 2019) failed to calculate for the following reasons:   The valid HDL cholesterol range is 20 to 100 mg/dL    Assessment & Plan:   Strain of lumbar region, initial encounter -     Methocarbamol; Take 1 tablet (750 mg total) by mouth every 8 (eight) hours as needed for muscle spasms.  Dispense: 60 tablet; Refill: 0 -     predniSONE; Take 1 tablet (20 mg total) by mouth 2 (two) times daily with a meal for 7 days.  Dispense: 14 tablet; Refill: 0 -     Ambulatory referral to Sports Medicine  Strain of neck muscle, initial encounter -     Methocarbamol; Take 1 tablet (750 mg total) by mouth every 8 (eight) hours as needed for muscle spasms.  Dispense: 60 tablet; Refill: 0 -     predniSONE; Take 1 tablet  (20 mg total) by mouth 2 (two) times daily with a meal for 7 days.  Dispense: 14 tablet; Refill: 0 -     Ambulatory referral to Sports Medicine    Return Return as planned for physical..    Libby Maw, MD

## 2022-11-18 NOTE — Patient Instructions (Signed)
Nice to meet you Please use heat  Please try the exercises  You can consider the compression   Please send me a message in [place] with any questions or updates.  Please see me back in 2-3 weeks.   --Dr. Raeford Razor

## 2022-11-18 NOTE — Assessment & Plan Note (Signed)
Acutely occurring.  Has low back pain as well as the radicular pattern down the right leg. - Counseled on home exercise therapy and supportive care. -Thoracic and lumbar x-ray. -Counseled on compression. -Could consider physical therapy

## 2022-11-19 ENCOUNTER — Encounter: Payer: Self-pay | Admitting: Gastroenterology

## 2022-11-19 ENCOUNTER — Telehealth: Payer: Self-pay | Admitting: Gastroenterology

## 2022-11-19 NOTE — Telephone Encounter (Signed)
Patient is calling wanting to know if Dr Silverio Decamp can give her another referral for physical therapy. Please advise

## 2022-11-22 ENCOUNTER — Other Ambulatory Visit: Payer: Self-pay

## 2022-11-22 ENCOUNTER — Telehealth: Payer: Self-pay | Admitting: Family Medicine

## 2022-11-22 DIAGNOSIS — K5902 Outlet dysfunction constipation: Secondary | ICD-10-CM

## 2022-11-22 DIAGNOSIS — M6289 Other specified disorders of muscle: Secondary | ICD-10-CM

## 2022-11-22 NOTE — Telephone Encounter (Signed)
Referral entered into EPIC. Patient notified.

## 2022-11-22 NOTE — Telephone Encounter (Signed)
Sure, please send new referral for pelvic floor physical therapy for pelvic floor dysfunction.  Thank you

## 2022-11-22 NOTE — Telephone Encounter (Signed)
Can we renew the pelvic floor PT referral?

## 2022-11-22 NOTE — Telephone Encounter (Signed)
Left VM for patient. If she calls back please have her speak with a nurse/CMA and inform that her thoracic spine x-ray is showing chronic compression fractures.  This combined with her bone density would classify her more as osteoporosis.  This will be worth considering treating going forward. Could consider PT.   If any questions then please take the best time and phone number to call and I will try to call her back.   Rosemarie Ax, MD [facility] 11/22/2022, 1:16 PM

## 2022-11-23 ENCOUNTER — Encounter: Payer: BC Managed Care – PPO | Attending: Gastroenterology | Admitting: Physical Therapy

## 2022-11-23 ENCOUNTER — Other Ambulatory Visit: Payer: Self-pay

## 2022-11-23 ENCOUNTER — Encounter: Payer: Self-pay | Admitting: Physical Therapy

## 2022-11-23 DIAGNOSIS — R252 Cramp and spasm: Secondary | ICD-10-CM | POA: Diagnosis present

## 2022-11-23 DIAGNOSIS — M6289 Other specified disorders of muscle: Secondary | ICD-10-CM | POA: Diagnosis not present

## 2022-11-23 DIAGNOSIS — M546 Pain in thoracic spine: Secondary | ICD-10-CM

## 2022-11-23 DIAGNOSIS — K5902 Outlet dysfunction constipation: Secondary | ICD-10-CM | POA: Diagnosis not present

## 2022-11-23 DIAGNOSIS — M5459 Other low back pain: Secondary | ICD-10-CM | POA: Diagnosis present

## 2022-11-23 DIAGNOSIS — R278 Other lack of coordination: Secondary | ICD-10-CM | POA: Diagnosis present

## 2022-11-23 NOTE — Therapy (Signed)
OUTPATIENT PHYSICAL THERAPY FEMALE PELVIC EVALUATION   Patient Name: Alyssa Cox MRN: [id number] DOB:03-05-1961, 62 y.o., female Today's Date: 11/23/2022  END OF SESSION:  PT End of Session - 11/23/22 1445     Visit Number 1    Date for PT Re-Evaluation 02/15/23    Authorization Type BCBS    PT Start Time 1300    PT Stop Time 1355    PT Time Calculation (min) 55 min    Activity Tolerance Patient tolerated treatment well    Behavior During Therapy [facility] for tasks assessed/performed             Past Medical History:  Diagnosis Date   Allergic rhinitis    Anal fissure    Chronic idiopathic constipation    Colon polyps    Endometriosis    Past Surgical History:  Procedure Laterality Date   ABDOMINAL HYSTERECTOMY     CESAREAN SECTION     HEMORRHOID SURGERY     VASCULAR SURGERY     Patient Active Problem List   Diagnosis Date Noted   Lumbar radiculopathy 11/18/2022   Leukopenia 06/22/2022   Sciatica of right side 06/22/2022   Flu vaccine need 06/22/2022   Allergic rhinitis 12/17/2021   Other fatigue 12/17/2021   Allergic reaction 11/26/2021   Xerosis cutis 11/26/2021   Gynecologic exam normal 11/19/2021   History of total abdominal hysterectomy and bilateral salpingo-oophorectomy 11/19/2021   Vaginal atrophy 11/19/2021   Glaucoma 09/23/2021   Healthcare maintenance 09/23/2021   Plantar fasciitis 09/23/2021   Need for shingles vaccine 09/23/2021    PCP: Libby Maw, MD  REFERRING PROVIDER: Mauri Pole, MD   REFERRING DIAG: [phone] (ICD-10-CM) - Dyssynergic defecationM62.89 (ICD-10-CM) - Pelvic floor dysfunction in female  THERAPY DIAG:  Other lack of coordination  Cramp and spasm  Other low back pain  Pain in thoracic spine  Rationale for Evaluation and Treatment: Rehabilitation  ONSET DATE: 12/2021  SUBJECTIVE:                                                                                                                                                                                            SUBJECTIVE STATEMENT: Patient feels pain in the back and right leg when she has a bowel movement. Patient stool comes out small and skinny. I can eat and feel my stomach growling. I feel the stool is blocked. Patient slipped on steps due to foot tangled with her pajama bottom and my back is hurting more. MD gave me a dilator  PAIN:  Are you having pain? Yes NPRS scale: 5/10 Pain location:  buttocks and low  back  Pain type: pulling, cramping Pain description: constant   Aggravating factors: bowel movement, bending forward to pick up something, going sideways, lifting items, standing Relieving factors: sit with heating pad, stretch  PRECAUTIONS: None  WEIGHT BEARING RESTRICTIONS: No  FALLS:  Has patient fallen in last 6 months? No  LIVING ENVIRONMENT: Lives with: lives with their spouse   OCCUPATION: walking 3 miles  PLOF: Independent  PATIENT GOALS: poop normal  PERTINENT HISTORY:  Abdominal Hysterectomy; cesarean section; Hemorrhoid surgery; Anal fissure; Endometriosis  BOWEL MOVEMENT: Pain with bowel movement: Yes Type of bowel movement:Type (Bristol Stool Scale) Type 1 or 4, Frequency every other day to 3 days, Strain Yes, and Splinting none Fully empty rectum: No Leakage: No Fiber supplement: Yes: crushed flax seed, stool softener, Miralax, Doculax  URINATION: Pain with urination: No Fully empty bladder: No, sometimes has to go 2 times Stream: Strong Urgency: Yes: depending on how much water she drinks Frequency: 2-3 hours Leakage:  none  INTERCOURSE: no pain with intercourse  PREGNANCY: C-section deliveries 2   OBJECTIVE:   COGNITION: Overall cognitive status: Within functional limits for tasks assessed     SENSATION: Light touch: Appears intact Proprioception: Appears intact   POSTURE: increased thoracic kyphosis  PELVIC ALIGNMENT:  LUMBARAROM/PROM:  A/PROM A/PROM  eval   Flexion Decreased by 25%  Extension full  Right lateral flexion Decreased by 25%  Left lateral flexion Decreased by 25% with pain  Right rotation Decreased by 25%  Left rotation Decreased by 25%   (Blank rows = not tested)  LOWER EXTREMITY ROM:    Passive ROM Right eval Left eval  Hip external rotation 40 45   (Blank rows = not tested)  LOWER EXTREMITY MMT:  MMT Right eval Left eval  Hip extension 3+/5 3+/5  Hip abduction 3+/5 4/5   PALPATION:   General  restrictions in the c-section scar, tightness in the lower abdomen and by the rib cage; able to move the lower rib cage with good movement ; tenderness in gluteus medius, quadratus, gluteus maximus                External Perineal Exam tenderness located on the levator ani, able to contract and bulge the anus                             Internal Pelvic Floor not assessed due to her seeing the doctor next week and do not want to tear any tissue  Patient confirms identification and approves PT to assess internal pelvic floor and treatment No  PELVIC MMT:   MMT eval  Vaginal   Internal Anal Sphincter   External Anal Sphincter   Puborectalis   Diastasis Recti   (Blank rows = not tested)         TODAY'S TREATMENT:  DATE: 11/23/22  EVAL See below   PATIENT EDUCATION:  Education details: educated patient on using anal dilators and gave her slippery stuff samples to use, educated on using vaginal moisturizer daily for her dryness Person educated: Patient Education method: Explanation Education comprehension: verbalized understanding  HOME EXERCISE PROGRAM: See above.   ASSESSMENT:  CLINICAL IMPRESSION: Patient is a 62 y.o. female  who was seen today for physical therapy evaluation and treatment for pelvic floor dysfunction and dyssynergic defecation.  Patient has been having issue with  defecation for awhile. She came to therapy in the past but her fissure did not close so wanted to wait till it does. Patient will strain to have a bowel movement. She has one every 2-3 days that are Type 1 to 4. She does not feel she empties her rectum and her abdomen is full like the stool is stuck. She will have pain in the lower abdomen and right trunk down to right calf when having a bowel movement. She feel several weeks ago on her stair and the pain has worsen since then. Her pain level is intermittent at level 5/10 with bowel movements, lifting or bending forward. Lumbar ROM is decreased by 25%. She has weakness in her hips. Pelvic floor muscle assessment was not done due to seeing the doctor next weak and do not want to tear in the rectum. She is using a rectal dilator and on the second one to stretch out a stricture in the abdomen. She has tenderness located in the right quadratus, right gluteal and left gluteus medius. Patient will benefit from skilled therapy to improve pelvic floor coordination with bowel movents and reduce her pain.   OBJECTIVE IMPAIRMENTS: decreased coordination, decreased strength, increased fascial restrictions, increased muscle spasms, and pain.   ACTIVITY LIMITATIONS: lifting, bending, and toileting  PARTICIPATION LIMITATIONS: community activity  PERSONAL FACTORS: Age, Time since onset of injury/illness/exacerbation, and 1-2 comorbidities: Abdominal Hysterectomy; cesarean section; Hemorrhoid surgery; Anal fissure; Endometriosis  are also affecting patient's functional outcome.   REHAB POTENTIAL: Excellent  CLINICAL DECISION MAKING: Evolving/moderate complexity  EVALUATION COMPLEXITY: Moderate   GOALS: Goals reviewed with patient? Yes  SHORT TERM GOALS: Target date: 12/22/22  Patient is independent with the use of the anal dilators Baseline: Goal status: INITIAL  2.  Patient allows therapist to assess the anal sphincter rectal strength and work on the anal  sphincter.  Baseline:  Goal status: INITIAL  3.  Patient reports her back to right calf pain decreased >/= 25% due to improved tissue mobiltiy.  Baseline:  Goal status: INITIAL   LONG TERM GOALS: Target date: 02/15/23  Patient independent with advanced HEP for core and pelvic floor to improve bowel movements.  Baseline:  Goal status: INITIAL  2.  Patient pain with bowel movements in her back to right calf decreased >/= 75% due to improved tissue mobility.  Baseline:  Goal status: INITIAL  3.  Patient is able to fully empty her rectum due to full relaxation of the rectum and improved tissue mobility of the abdomen.  Baseline:  Goal status: INITIAL  4.  Patient reports she is straining </= 50% due to improved coordination of the pelvic floor muscles.  Baseline:  Goal status: INITIAL    PLAN:  PT FREQUENCY: 1x/week  PT DURATION: 12 weeks  PLANNED INTERVENTIONS: Therapeutic exercises, Therapeutic activity, Neuromuscular re-education, Patient/Family education, Joint mobilization, Dry Needling, Electrical stimulation, Spinal mobilization, Cryotherapy, Moist heat, Splintting, Taping, Ultrasound, Biofeedback, and Manual therapy  PLAN FOR NEXT SESSION:  dry needling to the right quadratus, along the gluteals,and piriformis,  see if can assess the rectum, joint mobilization to thoracic lumbar, abdominal work   Earlie Counts, PT 11/23/22 3:59 PM

## 2022-11-29 ENCOUNTER — Telehealth: Payer: Self-pay | Admitting: Family Medicine

## 2022-11-29 ENCOUNTER — Ambulatory Visit (INDEPENDENT_AMBULATORY_CARE_PROVIDER_SITE_OTHER): Payer: BC Managed Care – PPO | Admitting: Gastroenterology

## 2022-11-29 ENCOUNTER — Encounter: Payer: Self-pay | Admitting: Gastroenterology

## 2022-11-29 VITALS — BP 118/76 | HR 75 | Ht 65.0 in | Wt 138.0 lb

## 2022-11-29 DIAGNOSIS — S39012A Strain of muscle, fascia and tendon of lower back, initial encounter: Secondary | ICD-10-CM

## 2022-11-29 DIAGNOSIS — K601 Chronic anal fissure: Secondary | ICD-10-CM | POA: Diagnosis not present

## 2022-11-29 DIAGNOSIS — M6289 Other specified disorders of muscle: Secondary | ICD-10-CM | POA: Diagnosis not present

## 2022-11-29 DIAGNOSIS — M5440 Lumbago with sciatica, unspecified side: Secondary | ICD-10-CM

## 2022-11-29 DIAGNOSIS — K6289 Other specified diseases of anus and rectum: Secondary | ICD-10-CM | POA: Diagnosis not present

## 2022-11-29 DIAGNOSIS — K5902 Outlet dysfunction constipation: Secondary | ICD-10-CM

## 2022-11-29 NOTE — Progress Notes (Signed)
Alyssa Cox    [id number]    1961-02-25  Primary Care Physician:Kremer, Mortimer Fries, MD  Referring Physician: Libby Maw, MD [place],  [place] [address]   Chief complaint: Anal fissure  HPI:  62 year old very pleasant female here for follow-up visit for anal fissure. She continues to have recurrent anorectal discomfort and anal fissure, she was doing better but felt recurrent rectal discomfort after she had a big hard stool.  She is currently taking daily stool softener, Dulcolax and MiraLAX as needed.  She restarted pelvic floor physical therapy, using anorectal dilators .  Her stool caliber is thin, she does not feel like she is evacuating completely She is using rectal nitroglycerin 0.125% twice a day with some improvement, is experiencing headaches.  She has low back pain with lumbar radiculopathy, was seen by sports medicine Dr. Raeford Razor and is being managed conservatively   Colonoscopy February 11, 2022 - One 15 mm polyp in the ascending colon, removed with a cold snare. Resected and retrieved. - Non-bleeding external and internal hemorrhoids. - The examination was otherwise normal.   Outpatient Encounter Medications as of 11/29/2022  Medication Sig   albuterol (VENTOLIN HFA) 108 (90 Base) MCG/ACT inhaler Inhale 1-2 puffs into the lungs every 6 (six) hours as needed for wheezing or shortness of breath. (Patient not taking: Reported on 11/18/2022)   AMBULATORY NON FORMULARY MEDICATION Medication Name: Diltiazem 2% with lidocaine 2% cream.  Apply a pea sized amount per rectum 3 times a day [place]   Biotin w/ Vitamins C & E (HAIR/SKIN/NAILS PO) Take by mouth. (Patient not taking: Reported on 07/05/2022)   Calcipotriene 0.005 % solution Apply topically.   calcium carbonate (OS-CAL - DOSED IN MG OF ELEMENTAL CALCIUM) 1250 (500 Ca) MG tablet Take 1 tablet by mouth. (Patient not taking: Reported on 09/13/2022)    cetirizine (ZYRTEC) 10 MG tablet Take 10 mg by mouth daily.   cholecalciferol (VITAMIN D3) 25 MCG (1000 UNIT) tablet Take 1,000 Units by mouth daily.   Estradiol (VAGIFEM) 10 MCG TABS vaginal tablet Place 1 tablet (10 mcg total) vaginally 2 (two) times a week. (Patient not taking: Reported on 11/18/2022)   fluticasone (FLONASE) 50 MCG/ACT nasal spray SPRAY 2 SPRAYS INTO EACH NOSTRIL EVERY DAY   hydrocortisone 2.5 % ointment SMARTSIG:sparingly Topical Twice Daily   latanoprost (XALATAN) 0.005 % ophthalmic solution latanoprost 0.005 % eye drops  INSTILL 1 DROP INTO BOTH EYES AT BEDTIME   methocarbamol (ROBAXIN-750) 750 MG tablet Take 1 tablet (750 mg total) by mouth every 8 (eight) hours as needed for muscle spasms.   Multiple Vitamins-Minerals (MULTIVITAMIN WOMEN 50+ PO) Take by mouth.   No facility-administered encounter medications on file as of 11/29/2022.    Allergies as of 11/29/2022 - Review Complete 11/23/2022  Allergen Reaction Noted   Morphine Itching 02/11/2022    Past Medical History:  Diagnosis Date   Allergic rhinitis    Anal fissure    Chronic idiopathic constipation    Colon polyps    Endometriosis     Past Surgical History:  Procedure Laterality Date   ABDOMINAL HYSTERECTOMY     CESAREAN SECTION     HEMORRHOID SURGERY     VASCULAR SURGERY      Family History  Problem Relation Age of Onset   Ovarian cancer Mother        mets to eye   Hypertension Mother    Stroke  Father    Hypertension Father    Hypertension Brother    Anuerysm Maternal Grandmother    Colon cancer Maternal Grandfather    Stroke Maternal Grandfather    Asthma Daughter    Asthma Son    Breast cancer Neg Hx    Esophageal cancer Neg Hx    Rectal cancer Neg Hx    Stomach cancer Neg Hx     Social History   Socioeconomic History   Marital status: Married    Spouse name: Not on file   Number of children: 2   Years of education: Not on file   Highest education level: Not on file   Occupational History   Not on file  Tobacco Use   Smoking status: Former    Types: Cigarettes    Quit date: 10/03/2013    Years since quitting: 9.1   Smokeless tobacco: Never  Vaping Use   Vaping Use: Never used  Substance and Sexual Activity   Alcohol use: Yes    Alcohol/week: 2.0 standard drinks of alcohol    Types: 2 Glasses of wine per week    Comment: 1-2 drinks a week   Drug use: Never   Sexual activity: Yes    Birth control/protection: Surgical  Other Topics Concern   Not on file  Social History Narrative   Not on file   Social Determinants of Health   Financial Resource Strain: Not on file  Food Insecurity: Not on file  Transportation Needs: Not on file  Physical Activity: Not on file  Stress: Not on file  Social Connections: Not on file  Intimate Partner Violence: Not on file      Review of systems: All other review of systems negative except as mentioned in the HPI.   Physical Exam: Vitals:   11/29/22 1441  BP: 118/76  Pulse: 75  SpO2: 97%   Body mass index is 22.96 kg/m. Gen:      No acute distress Neuro: alert and oriented x 3 Psych: normal mood and affect Rectal exam declined  Data Reviewed:  Reviewed labs, radiology imaging, old records and pertinent past GI work up   Assessment and Plan/Recommendations:  62 year old very pleasant female with recurrent anal fissure with anorectal discomfort  Continue small pea-sized amount of 0.125% nitroglycerin per rectum 3-4 times daily for additional 8 weeks Use MiraLAX 1 capful daily and stool softener at bedtime Avoid excessive straining during defecation  Use serial anal dilators as tolerated to improve anorectal stricture and pelvic floor physical therapy to improve dyssynergic defecation  Refer to colorectal surgery for further management of chronic anal fissure and hemorrhoids   This visit required 30 minutes of patient care (this includes precharting, chart review, review of results,  face-to-face time used for counseling as well as treatment plan and follow-up. The patient was provided an opportunity to ask questions and all were answered. The patient agreed with the plan and demonstrated an understanding of the instructions.  Damaris Hippo , MD    CC: Libby Maw,*

## 2022-11-29 NOTE — Patient Instructions (Signed)
You have been scheduled for an appointment with Dr. Marcello Moores at [facility]. Your appointment is on _____________ at ____________. Please arrive at ______________ for registration. Make certain to bring a list of current medications, including any over the counter medications or vitamins. Also bring your co-pay if you have one as well as your insurance cards. [facility] is located at [address]. Should you need to reschedule your appointment, please contact them at [phone].  The [facility] providers would like to encourage you to use [facility] to communicate with providers for non-urgent requests or questions.  Due to long hold times on the telephone, sending your provider a message by [facility] may be a faster and more efficient way to get a response.  Please allow 48 business hours for a response.  Please remember that this is for non-urgent requests.

## 2022-11-29 NOTE — Telephone Encounter (Signed)
Please advise message below okay for new referral?

## 2022-11-29 NOTE — Telephone Encounter (Signed)
Caller Name: Jerriyah Call back phone #: [phone]  Reason for Call: Pt has stated that she was not satisfied with the first referral to PT. She has a provider she would like her referral sent to. Her back has had another back spasm and is in pain.  Lum Babe he is a [facility] provider.

## 2022-11-30 NOTE — Telephone Encounter (Signed)
Referral moved to [facility]

## 2022-12-15 ENCOUNTER — Ambulatory Visit: Payer: BC Managed Care – PPO

## 2022-12-15 ENCOUNTER — Ambulatory Visit: Payer: BC Managed Care – PPO | Attending: Family Medicine | Admitting: Physical Therapy

## 2022-12-15 ENCOUNTER — Encounter: Payer: Self-pay | Admitting: Physical Therapy

## 2022-12-15 DIAGNOSIS — R252 Cramp and spasm: Secondary | ICD-10-CM | POA: Insufficient documentation

## 2022-12-15 DIAGNOSIS — S39012A Strain of muscle, fascia and tendon of lower back, initial encounter: Secondary | ICD-10-CM | POA: Diagnosis not present

## 2022-12-15 DIAGNOSIS — M5459 Other low back pain: Secondary | ICD-10-CM | POA: Insufficient documentation

## 2022-12-15 NOTE — Therapy (Signed)
OUTPATIENT PHYSICAL THERAPY THORACOLUMBAR EVALUATION   Patient Name: Alyssa Cox MRN: [id number] DOB:[birth year], 62 y.o., female Today's Date: 12/15/2022  END OF SESSION:  PT End of Session - 12/15/22 0842     Visit Number 1   evaluation for the low back   Date for PT Re-Evaluation 02/13/23    Authorization Type BCBS    PT Start Time 0840    PT Stop Time 0930    PT Time Calculation (min) 50 min    Activity Tolerance Patient tolerated treatment well    Behavior During Therapy [facility] for tasks assessed/performed             Past Medical History:  Diagnosis Date   Allergic rhinitis    Anal fissure    Chronic idiopathic constipation    Colon polyps    Endometriosis    Pulled muscle 10/2022   Past Surgical History:  Procedure Laterality Date   ABDOMINAL HYSTERECTOMY     CESAREAN SECTION     HEMORRHOID SURGERY     VASCULAR SURGERY     Patient Active Problem List   Diagnosis Date Noted   Lumbar radiculopathy 11/18/2022   Leukopenia 06/22/2022   Sciatica of right side 06/22/2022   Flu vaccine need 06/22/2022   Allergic rhinitis 12/17/2021   Other fatigue 12/17/2021   Allergic reaction 11/26/2021   Xerosis cutis 11/26/2021   Gynecologic exam normal 11/19/2021   History of total abdominal hysterectomy and bilateral salpingo-oophorectomy 11/19/2021   Vaginal atrophy 11/19/2021   Glaucoma 09/23/2021   Healthcare maintenance 09/23/2021   Plantar fasciitis 09/23/2021   Need for shingles vaccine 09/23/2021    PCP: Ethelene Hal  REFERRING PROVIDER: Franne Grip DIAG: Low back pain  Rationale for Evaluation and Treatment: Rehabilitation  THERAPY DIAG:  Other low back pain  Cramp and spasm  ONSET DATE: 11/14/22  SUBJECTIVE:                                                                                                                                                                                           SUBJECTIVE STATEMENT: Patient reports a month ago  she tripped on her baggy pajamas, x-rays of the back were negative, she does report low back pain on and off for a number of years.  PERTINENT HISTORY:  See above  PAIN:  Are you having pain? Yes: NPRS scale: 2/10 Pain location: right low back and into the thoracic area sciatic issues right LE Pain description: sciatic, tightness Aggravating factors: bending, twisting, pain 9/10 Relieving factors: lie on floor, light stretching, at best pain a 1/10  PRECAUTIONS: None  WEIGHT BEARING RESTRICTIONS: No  FALLS:  Has patient fallen in last 6 months? Yes. Number of falls 1  LIVING ENVIRONMENT: Lives with: lives with their family Lives in: House/apartment Stairs: Yes: Internal: 16 steps; can reach both Has following equipment at home: None  OCCUPATION: retired does some book keeping sitting  PLOF: Independent  PATIENT GOALS: less pain and feel stronger  NEXT MD VISIT: March 7th  OBJECTIVE:   DIAGNOSTIC FINDINGS:   1. No acute abnormality of the thoracic or lumbar spine. 2. Multilevel degenerative changes of the lumbar spine, greatest at L5-S1. 3. Chronic mild compression deformities of T8, T9, T11, and L1 vertebral bodies.  PATIENT SURVEYS:  FOTO 43.6 COGNITION: Overall cognitive status: Within functional limits for tasks assessed     SENSATION: WFL  MUSCLE LENGTH: Hamstrings: Right 50 deg; Left 60 deg Very tight calves and right quad  POSTURE: rounded shoulders, forward head, and decreased lumbar lordosis  PALPATION: Tight mild tenderness in the right lumbar area, very tender in the right buttock  LUMBAR ROM: decreased 25% with mild tgihtness and pain   LOWER EXTREMITY ROM:   tight HS, calf and piriformis on the right   LOWER EXTREMITY MMT:    MMT Right eval Left eval  Hip flexion 4- 4-  Hip extension    Hip abduction 4- 4-  Hip adduction    Hip internal rotation    Hip external rotation    Knee flexion 4 4  Knee extension 4 4  Ankle  dorsiflexion    Ankle plantarflexion    Ankle inversion    Ankle eversion     (Blank rows = not tested)  LUMBAR SPECIAL TESTS:  Straight leg raise test: Positive and Thomas test: Positive  GAIT: WFL's just a slight limp on the right  TODAY'S TREATMENT:                                                                                                                              DATE:     PATIENT EDUCATION:  Education details: POC/HEP Person educated: Patient Education method: Explanation, Demonstration, Corporate treasurer cues, Verbal cues, and Handouts Education comprehension: verbalized understanding  HOME EXERCISE PROGRAM: Access Code: [email or web address] URL: [email or web address] Date: 12/15/2022 Prepared by: Lum Babe  Exercises - Standing Row with Anchored Resistance  - 1 x daily - 7 x weekly - 3 sets - 10 reps - 3 hold - Shoulder extension with resistance - Neutral  - 1 x daily - 7 x weekly - 3 sets - 10 reps - 3 hold - Supine Quadriceps Stretch with Strap on Table  - 2 x daily - 7 x weekly - 1 sets - 5 reps - 20 hold  ASSESSMENT:  CLINICAL IMPRESSION: Patient is a 62 y.o. female who was seen today for physical therapy evaluation and treatment for back pain and some right sciatic pain.  She reports having the pain for > a year.  She is very tight in the right HS, calf,  piriformis and the right quad.  She has some weakness in the hips.  She would like to get stronger and more flexible, she is seeing Earlie Counts, PT for pelvic issues as well.  She has a weak core  OBJECTIVE IMPAIRMENTS: cardiopulmonary status limiting activity, decreased activity tolerance, decreased coordination, decreased endurance, decreased mobility, difficulty walking, decreased ROM, decreased strength, increased muscle spasms, impaired flexibility, improper body mechanics, postural dysfunction, and pain.   REHAB POTENTIAL: Good  CLINICAL DECISION MAKING: Stable/uncomplicated  EVALUATION COMPLEXITY:  Low   GOALS: Goals reviewed with patient? Yes  SHORT TERM GOALS: Target date: 12/26/22  Independent with initial HEP Goal status: INITIAL  LONG TERM GOALS: Target date: 03/15/23  Understand posture and body mechanics Goal status: INITIAL  2.  Increase lumbar ROM to WNL's Goal status: INITIAL  3.  Increase flexibility of the right quad to when she is in prone passively we can get her heel to her buttock Goal status: INITIAL  4.  Report pain decreased 50% Goal status: INITIAL  5.  Independent and safe with gym program Goal status: INITIAL  PLAN:  PT FREQUENCY: 1-2x/week  PT DURATION: 12 weeks  PLANNED INTERVENTIONS: Therapeutic exercises, Therapeutic activity, Neuromuscular re-education, Balance training, Gait training, Patient/Family education, Self Care, Joint mobilization, Dry Needling, Electrical stimulation, Spinal mobilization, Taping, Traction, and Manual therapy.  PLAN FOR NEXT SESSION: start gym activities for core and overall strength and work on flexibility   [employer], PT 12/15/2022, 8:43 AM

## 2022-12-23 ENCOUNTER — Encounter: Payer: Self-pay | Admitting: Family Medicine

## 2022-12-23 ENCOUNTER — Ambulatory Visit
Admission: RE | Admit: 2022-12-23 | Discharge: 2022-12-23 | Disposition: A | Discharge status: Home / Self Care | Payer: BC Managed Care – PPO | Source: Ambulatory Visit | Attending: Obstetrics and Gynecology | Admitting: Obstetrics and Gynecology

## 2022-12-23 ENCOUNTER — Ambulatory Visit (INDEPENDENT_AMBULATORY_CARE_PROVIDER_SITE_OTHER): Payer: BC Managed Care – PPO | Admitting: Family Medicine

## 2022-12-23 VITALS — BP 119/78 | HR 61 | Temp 97.5°F | Ht 65.5 in | Wt 136.2 lb

## 2022-12-23 DIAGNOSIS — Z0000 Encounter for general adult medical examination without abnormal findings: Secondary | ICD-10-CM | POA: Diagnosis not present

## 2022-12-23 DIAGNOSIS — S39012A Strain of muscle, fascia and tendon of lower back, initial encounter: Secondary | ICD-10-CM

## 2022-12-23 DIAGNOSIS — E559 Vitamin D deficiency, unspecified: Secondary | ICD-10-CM | POA: Diagnosis not present

## 2022-12-23 DIAGNOSIS — Z8739 Personal history of other diseases of the musculoskeletal system and connective tissue: Secondary | ICD-10-CM

## 2022-12-23 DIAGNOSIS — Z1231 Encounter for screening mammogram for malignant neoplasm of breast: Secondary | ICD-10-CM

## 2022-12-23 LAB — COMPREHENSIVE METABOLIC PANEL
ALT: 12 U/L (ref 0–35)
AST: 22 U/L (ref 0–37)
Albumin: 4.2 g/dL (ref 3.5–5.2)
Alkaline Phosphatase: 48 U/L (ref 39–117)
BUN: 12 mg/dL (ref 6–23)
CO2: 30 mEq/L (ref 19–32)
Calcium: 9.6 mg/dL (ref 8.4–10.5)
Chloride: 104 mEq/L (ref 96–112)
Creatinine, Ser: 0.86 mg/dL (ref 0.40–1.20)
GFR: 71.63 mL/min (ref >60.00)
Glucose, Bld: 90 mg/dL (ref 70–99)
Potassium: 4.1 mEq/L (ref 3.5–5.1)
Sodium: 142 mEq/L (ref 135–145)
Total Bilirubin: 0.6 mg/dL (ref 0.2–1.2)
Total Protein: 6.5 g/dL (ref 6.0–8.3)

## 2022-12-23 LAB — CBC
HCT: 41.4 % (ref 36.0–46.0)
Hemoglobin: 13.8 g/dL (ref 12.0–15.0)
MCHC: 33.3 g/dL (ref 30.0–36.0)
MCV: 93.7 fl (ref 78.0–100.0)
Platelets: 253 10*3/uL (ref 150.0–400.0)
RBC: 4.42 Mil/uL (ref 3.87–5.11)
RDW: 13.3 % (ref 11.5–15.5)
WBC: 4 10*3/uL (ref 4.0–10.5)

## 2022-12-23 LAB — LIPID PANEL
Cholesterol: 254 mg/dL — ABNORMAL HIGH (ref 0–200)
HDL: 105.4 mg/dL (ref >39.00)
LDL Cholesterol: 133 mg/dL — ABNORMAL HIGH (ref 0–99)
NonHDL: 148.61
Total CHOL/HDL Ratio: 2
Triglycerides: 78 mg/dL (ref 0.0–149.0)
VLDL: 15.6 mg/dL (ref 0.0–40.0)

## 2022-12-23 LAB — URINALYSIS, ROUTINE W REFLEX MICROSCOPIC
Bilirubin Urine: NEGATIVE
Ketones, ur: NEGATIVE
Leukocytes,Ua: NEGATIVE
Nitrite: NEGATIVE
Specific Gravity, Urine: 1.01 (ref 1.000–1.030)
Total Protein, Urine: NEGATIVE
Urine Glucose: NEGATIVE
Urobilinogen, UA: 0.2 (ref 0.0–1.0)
WBC, UA: NONE SEEN (ref 0–?)
pH: 7 (ref 5.0–8.0)

## 2022-12-23 LAB — VITAMIN D 25 HYDROXY (VIT D DEFICIENCY, FRACTURES): VITD: 48.71 ng/mL (ref 30.00–100.00)

## 2022-12-23 MED ORDER — MELOXICAM 7.5 MG PO TABS: 7.5000 mg | ORAL_TABLET | Freq: Every day | ORAL | 2 refills | Status: AC | PRN

## 2022-12-23 NOTE — Progress Notes (Addendum)
Established Patient Office Visit   Subjective:  Patient ID: Alyssa Cox, female    DOB: Oct 19, 1960  Age: 62 y.o. MRN: [id number]  Chief Complaint  Patient presents with   Annual Exam    Fasting. Concerns about fingernails?     HPI Encounter Diagnoses  Name Primary?   Strain of lumbar region, initial encounter Yes   History of osteopenia    Vitamin D deficiency    Healthcare maintenance    For health check and follow-up of above.  She is in the midst of dealing with an anal fissure.  Calcium that she needs for her osteopenia can be constipating.  She is currently taking at 1000 IUs of vitamin D3.  She is exercising daily.  She is working with a physical therapy for lumbar strain.  Robaxin-750 milligrams has been too sedating.  She prefers meloxicam.   Review of Systems  Constitutional: Negative.   HENT: Negative.    Eyes:  Negative for blurred vision, discharge and redness.  Respiratory: Negative.    Cardiovascular: Negative.   Gastrointestinal:  Positive for constipation. Negative for abdominal pain.  Genitourinary: Negative.   Musculoskeletal:  Positive for back pain. Negative for myalgias.  Skin:  Negative for rash.  Neurological:  Negative for tingling, loss of consciousness and weakness.  Endo/Heme/Allergies:  Negative for polydipsia.     Current Outpatient Medications:    albuterol (VENTOLIN HFA) 108 (90 Base) MCG/ACT inhaler, Inhale 1-2 puffs into the lungs every 6 (six) hours as needed for wheezing or shortness of breath., Disp: 8 g, Rfl: 2   AMBULATORY NON FORMULARY MEDICATION, Medication Name: Diltiazem 2% with lidocaine 2% cream.  Apply a pea sized amount per rectum 3 times a day [place], Disp: 30 g, Rfl: 0   Biotin w/ Vitamins C & E (HAIR/SKIN/NAILS PO), Take by mouth., Disp: , Rfl:    Calcipotriene 0.005 % solution, Apply topically., Disp: , Rfl:    cetirizine (ZYRTEC) 10 MG tablet, Take 10 mg by mouth daily., Disp: , Rfl:    cholecalciferol  (VITAMIN D3) 25 MCG (1000 UNIT) tablet, Take 1,000 Units by mouth daily., Disp: , Rfl:    Estradiol (VAGIFEM) 10 MCG TABS vaginal tablet, Place 1 tablet (10 mcg total) vaginally 2 (two) times a week., Disp: 8 tablet, Rfl: 11   fluticasone (FLONASE) 50 MCG/ACT nasal spray, SPRAY 2 SPRAYS INTO EACH NOSTRIL EVERY DAY, Disp: 48 mL, Rfl: 2   hydrocortisone 2.5 % ointment, SMARTSIG:sparingly Topical Twice Daily, Disp: , Rfl:    latanoprost (XALATAN) 0.005 % ophthalmic solution, latanoprost 0.005 % eye drops  INSTILL 1 DROP INTO BOTH EYES AT BEDTIME, Disp: , Rfl:    meloxicam (MOBIC) 7.5 MG tablet, Take 1 tablet (7.5 mg total) by mouth daily as needed for pain., Disp: 30 tablet, Rfl: 2   Multiple Vitamins-Minerals (MULTIVITAMIN WOMEN 50+ PO), Take by mouth., Disp: , Rfl:    calcium carbonate (OS-CAL - DOSED IN MG OF ELEMENTAL CALCIUM) 1250 (500 Ca) MG tablet, Take 1 tablet by mouth. (Patient not taking: Reported on 12/23/2022), Disp: , Rfl:    methocarbamol (ROBAXIN-750) 750 MG tablet, Take 1 tablet (750 mg total) by mouth every 8 (eight) hours as needed for muscle spasms. (Patient not taking: Reported on 12/23/2022), Disp: 60 tablet, Rfl: 0   Objective:     BP 119/78 (BP Location: Right Arm, Patient Position: Sitting)   Pulse 61   Temp (!) 97.5 F (36.4 C) (Temporal)   Ht 5' 5.5" (  1.664 m)   Wt 136 lb 3.2 oz (61.8 kg)   SpO2 99%   BMI 22.32 kg/m    Physical Exam Constitutional:      General: She is not in acute distress.    Appearance: Normal appearance. She is not ill-appearing, toxic-appearing or diaphoretic.  HENT:     Head: Normocephalic and atraumatic.     Right Ear: Tympanic membrane, ear canal and external ear normal.     Left Ear: Tympanic membrane, ear canal and external ear normal.     Mouth/Throat:     Mouth: Mucous membranes are moist.     Pharynx: Oropharynx is clear. No oropharyngeal exudate or posterior oropharyngeal erythema.  Eyes:     General: No scleral icterus.        Right eye: No discharge.        Left eye: No discharge.     Extraocular Movements: Extraocular movements intact.     Conjunctiva/sclera: Conjunctivae normal.     Pupils: Pupils are equal, round, and reactive to light.  Cardiovascular:     Rate and Rhythm: Normal rate and regular rhythm.  Pulmonary:     Effort: Pulmonary effort is normal. No respiratory distress.     Breath sounds: Normal breath sounds.  Abdominal:     General: Bowel sounds are normal.  Musculoskeletal:     Cervical back: No rigidity or tenderness.  Lymphadenopathy:     Cervical: No cervical adenopathy.  Skin:    General: Skin is warm and dry.  Neurological:     Mental Status: She is alert and oriented to person, place, and time.  Psychiatric:        Mood and Affect: Mood normal.        Behavior: Behavior normal.      No results found for any visits on 12/23/22.    The ASCVD Risk score (Arnett DK, et al., 2019) failed to calculate for the following reasons:   The valid HDL cholesterol range is 20 to 100 mg/dL    Assessment & Plan:   Strain of lumbar region, initial encounter -     Meloxicam; Take 1 tablet (7.5 mg total) by mouth daily as needed for pain.  Dispense: 30 tablet; Refill: 2  History of osteopenia -     Comprehensive metabolic panel  Vitamin D deficiency -     Comprehensive metabolic panel -     VITAMIN D 25 Hydroxy (Vit-D Deficiency, Fractures)  Healthcare maintenance -     CBC -     Lipid panel -     Urinalysis, Routine w reflex microscopic    Return in about 1 year (around 12/23/2023), or if symptoms worsen or fail to improve.  Encouraged her to continue her healthy active lifestyle.  Continue physical therapy for lumbar strain.  Meloxicam as needed.  Information was given on health maintenance and disease prevention as well as osteopenia.  Follow-up with new provider for anal fissure.  Avoid Dulcolax or other stimulant laxatives and lieu of using MiraLAX and a stool  softener.  Libby Maw, MD

## 2022-12-27 ENCOUNTER — Ambulatory Visit: Payer: Self-pay | Admitting: General Surgery

## 2022-12-27 NOTE — H&P (View-Only) (Signed)
  REFERRING PHYSICIAN:  Nanadigam, Kavitha V, MD  PROVIDER:   CHRISTINE , MD  MRN: [id number] DOB: 10/06/1961 DATE OF ENCOUNTER: 12/27/2022  Subjective   Chief Complaint: New Consultation (Anal fissure repair,)     History of Present Illness: Alyssa Cox is a 61 y.o. female who is seen today as an office consultation at the request of Dr. Nandigam for evaluation of New Consultation (Anal fissure repair,) .  61-year-old female with anal fissure that occurred October 2023 with a hard stool.  She was prescribed nitroglycerin with some improvement.  She saw Dr. Nandigam again and was given a prescription for diltiazem ointment.  She has been using this as well with no change in her symptoms.  Last colonoscopy was in April 2023 and normal.   Review of Systems: A complete review of systems was obtained from the patient.  I have reviewed this information and discussed as appropriate with the patient.  See HPI as well for other ROS.    Medical History: Past Medical History:  Diagnosis Date   Arthritis    Asthma, unspecified asthma severity, unspecified whether complicated, unspecified whether persistent    Glaucoma (increased eye pressure)     There is no problem list on file for this patient.   Past Surgical History:  Procedure Laterality Date   HYSTERECTOMY  2000     Allergies  Allergen Reactions   Morphine Itching    No current outpatient medications on file prior to visit.   No current facility-administered medications on file prior to visit.    History reviewed. No pertinent family history.   Social History   Tobacco Use  Smoking Status Former   Types: Cigarettes  Smokeless Tobacco Never     Social History   Socioeconomic History   Marital status: Married  Tobacco Use   Smoking status: Former    Types: Cigarettes   Smokeless tobacco: Never  Substance and Sexual Activity   Alcohol use: Yes    Objective:    Vitals:   12/27/22 1522  12/27/22 1523  BP: 131/78   Pulse: 80   Temp: 36.7 C (98 F)   SpO2: 98%   Weight: 61.7 kg (136 lb)   Height: 165.1 cm (5' 5")   PainSc:    2  PainLoc:  Rectum     Exam Gen: NAD Abd: soft Rectal: Stricture noted.  No obvious fissure.   Labs, Imaging and Diagnostic Testing: Notes from gastroenterologist reviewed in detail.  Assessment and Plan:  There are no diagnoses linked to this encounter.   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  I recommended exam under anesthesia with anal dilation and probable chemical sphincterotomy for chronic anal fissures.  We discussed this in detail including a small risk of incontinence with Botox injection.  All questions were answered.  She does have rectal dilators at home, which she will use daily after surgery.   C , MD Colon and Rectal Surgery [facility]   

## 2022-12-27 NOTE — H&P (Signed)
  REFERRING PHYSICIAN:  Ree Shay, MD  PROVIDER:  Monico Blitz, MD  MRN: [id number] DOB: 03-25-1961 DATE OF ENCOUNTER: 12/27/2022  Subjective   Chief Complaint: New Consultation (Anal fissure repair,)     History of Present Illness: Alyssa Cox is a 62 y.o. female who is seen today as an office consultation at the request of Dr. Silverio Decamp for evaluation of New Consultation (Anal fissure repair,) .  62 year old female with anal fissure that occurred October 2023 with a hard stool.  She was prescribed nitroglycerin with some improvement.  She saw Dr. Silverio Decamp again and was given a prescription for diltiazem ointment.  She has been using this as well with no change in her symptoms.  Last colonoscopy was in April 2023 and normal.   Review of Systems: A complete review of systems was obtained from the patient.  I have reviewed this information and discussed as appropriate with the patient.  See HPI as well for other ROS.    Medical History: Past Medical History:  Diagnosis Date   Arthritis    Asthma, unspecified asthma severity, unspecified whether complicated, unspecified whether persistent    Glaucoma (increased eye pressure)     There is no problem list on file for this patient.   Past Surgical History:  Procedure Laterality Date   HYSTERECTOMY  2000     Allergies  Allergen Reactions   Morphine Itching    No current outpatient medications on file prior to visit.   No current facility-administered medications on file prior to visit.    History reviewed. No pertinent family history.   Social History   Tobacco Use  Smoking Status Former   Types: Cigarettes  Smokeless Tobacco Never     Social History   Socioeconomic History   Marital status: Married  Tobacco Use   Smoking status: Former    Types: Cigarettes   Smokeless tobacco: Never  Substance and Sexual Activity   Alcohol use: Yes    Objective:    Vitals:   12/27/22 1522  12/27/22 1523  BP: 131/78   Pulse: 80   Temp: 36.7 C (98 F)   SpO2: 98%   Weight: 61.7 kg (136 lb)   Height: 165.1 cm (5\' 5" )   PainSc:    2  PainLoc:  Rectum     Exam Gen: NAD Abd: soft Rectal: Stricture noted.  No obvious fissure.   Labs, Imaging and Diagnostic Testing: Notes from gastroenterologist reviewed in detail.  Assessment and Plan:  There are no diagnoses linked to this encounter.   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  I recommended exam under anesthesia with anal dilation and probable chemical sphincterotomy for chronic anal fissures.  We discussed this in detail including a small risk of incontinence with Botox injection.  All questions were answered.  She does have rectal dilators at home, which she will use daily after surgery.  Rosario Adie, MD Colon and Rectal Surgery [facility]

## 2022-12-29 ENCOUNTER — Ambulatory Visit: Payer: BC Managed Care – PPO | Attending: Family Medicine | Admitting: Physical Therapy

## 2022-12-29 ENCOUNTER — Encounter: Payer: Self-pay | Admitting: Physical Therapy

## 2022-12-29 DIAGNOSIS — M5459 Other low back pain: Secondary | ICD-10-CM | POA: Diagnosis present

## 2022-12-29 DIAGNOSIS — M546 Pain in thoracic spine: Secondary | ICD-10-CM | POA: Insufficient documentation

## 2022-12-29 DIAGNOSIS — R252 Cramp and spasm: Secondary | ICD-10-CM | POA: Diagnosis present

## 2022-12-29 NOTE — Therapy (Signed)
OUTPATIENT PHYSICAL THERAPY THORACOLUMBAR EVALUATION   Patient Name: Alyssa Cox MRN: [id number] DOB:[birth year], 62 y.o., female Today's Date: 12/29/2022  END OF SESSION:  PT End of Session - 12/29/22 1443     Visit Number 2   back pain   Date for PT Re-Evaluation 02/13/23    Authorization Type BC[employer]    PT Start Time 1441    PT Stop Time 1528    PT Time Calculation (min) 47 min    Activity Tolerance Patient tolerated treatment well    Behavior During Therapy [facility] for tasks assessed/performed             Past Medical History:  Diagnosis Date   Allergic rhinitis    Anal fissure    Chronic idiopathic constipation    Colon polyps    Endometriosis    Pulled muscle 10/2022   Past Surgical History:  Procedure Laterality Date   ABDOMINAL HYSTERECTOMY     CESAREAN SECTION     HEMORRHOID SURGERY     VASCULAR SURGERY     Patient Active Problem List   Diagnosis Date Noted   Lumbar radiculopathy 11/18/2022   Leukopenia 06/22/2022   Sciatica of right side 06/22/2022   Flu vaccine need 06/22/2022   Allergic rhinitis 12/17/2021   Other fatigue 12/17/2021   Allergic reaction 11/26/2021   Xerosis cutis 11/26/2021   Gynecologic exam normal 11/19/2021   History of total abdominal hysterectomy and bilateral salpingo-oophorectomy 11/19/2021   Vaginal atrophy 11/19/2021   Glaucoma 09/23/2021   Healthcare maintenance 09/23/2021   Plantar fasciitis 09/23/2021   Need for shingles vaccine 09/23/2021    PCP: Ethelene Hal  REFERRING PROVIDER: Franne Grip DIAG: Low back pain  Rationale for Evaluation and Treatment: Rehabilitation  THERAPY DIAG:  Other low back pain  Cramp and spasm  Pain in thoracic spine  ONSET DATE: 11/14/22  SUBJECTIVE:                                                                                                                                                                                           SUBJECTIVE STATEMENT: Patient had some  questions about the HEP, she feels that she is doing okay, she has a little bit of pain in the low back, neck and rhomboids on the right  PERTINENT HISTORY:  See above  PAIN:  Are you having pain? Yes: NPRS scale: 2/10 Pain location: right low back and into the thoracic area sciatic issues right LE Pain description: sciatic, tightness Aggravating factors: bending, twisting, pain 9/10 Relieving factors: lie on floor, light stretching, at best pain a 1/10  PRECAUTIONS: None  WEIGHT BEARING  RESTRICTIONS: No  FALLS:  Has patient fallen in last 6 months? Yes. Number of falls 1  LIVING ENVIRONMENT: Lives with: lives with their family Lives in: House/apartment Stairs: Yes: Internal: 16 steps; can reach both Has following equipment at home: None  OCCUPATION: retired does some book keeping sitting  PLOF: Independent  PATIENT GOALS: less pain and feel stronger  NEXT MD VISIT: March 7th  OBJECTIVE:   DIAGNOSTIC FINDINGS:   1. No acute abnormality of the thoracic or lumbar spine. 2. Multilevel degenerative changes of the lumbar spine, greatest at L5-S1. 3. Chronic mild compression deformities of T8, T9, T11, and L1 vertebral bodies.  PATIENT SURVEYS:  FOTO 43.6 COGNITION: Overall cognitive status: Within functional limits for tasks assessed     SENSATION: WFL  MUSCLE LENGTH: Hamstrings: Right 50 deg; Left 60 deg Very tight calves and right quad  POSTURE: rounded shoulders, forward head, and decreased lumbar lordosis  PALPATION: Tight mild tenderness in the right lumbar area, very tender in the right buttock  LUMBAR ROM: decreased 25% with mild tgihtness and pain   LOWER EXTREMITY ROM:   tight HS, calf and piriformis on the right   LOWER EXTREMITY MMT:    MMT Right eval Left eval  Hip flexion 4- 4-  Hip extension    Hip abduction 4- 4-  Hip adduction    Hip internal rotation    Hip external rotation    Knee flexion 4 4  Knee extension 4 4  Ankle  dorsiflexion    Ankle plantarflexion    Ankle inversion    Ankle eversion     (Blank rows = not tested)  LUMBAR SPECIAL TESTS:  Straight leg raise test: Positive and Thomas test: Positive  GAIT: WFL's just a slight limp on the right  TODAY'S TREATMENT:                                                                                                                              DATE:   12/29/22 Reviewed HEP, some verbal and tactile cues needed Nustep level 5 x 5 minutes Reviewed posture on an elliptical that she has at home Calf stretches Passive HS and piriformis stretches 5# straight arm pulls 15# rows 20# lats all with cues for posture and form Feet on ball K2C, trunk rotaiton, small bridge and isometric abs STM to the lumbar and thoracic area  PATIENT EDUCATION:  Education details: POC/HEP Person educated: Patient Education method: Explanation, Demonstration, Corporate treasurer cues, Verbal cues, and Handouts Education comprehension: verbalized understanding  HOME EXERCISE PROGRAM: Access Code: [email or web address] URL: [email or web address] Date: 12/15/2022 Prepared by: Lum Babe  Exercises - Standing Row with Anchored Resistance  - 1 x daily - 7 x weekly - 3 sets - 10 reps - 3 hold - Shoulder extension with resistance - Neutral  - 1 x daily - 7 x weekly - 3 sets - 10 reps - 3 hold - Supine Quadriceps Stretch with Strap on Table  -  2 x daily - 7 x weekly - 1 sets - 5 reps - 20 hold  ASSESSMENT:  CLINICAL IMPRESSION: Patient doing well with her exercises, we progressed to gym activities and some supine exercises without an incresae of pain, she remains very tight in the calves, the Hs and the piriformis, she has a knot in the right low back that replicates her sciatic pain OBJECTIVE IMPAIRMENTS: cardiopulmonary status limiting activity, decreased activity tolerance, decreased coordination, decreased endurance, decreased mobility, difficulty walking, decreased ROM,  decreased strength, increased muscle spasms, impaired flexibility, improper body mechanics, postural dysfunction, and pain.   REHAB POTENTIAL: Good  CLINICAL DECISION MAKING: Stable/uncomplicated  EVALUATION COMPLEXITY: Low   GOALS: Goals reviewed with patient? Yes  SHORT TERM GOALS: Target date: 12/26/22  Independent with initial HEP Goal status: met  LONG TERM GOALS: Target date: 03/15/23  Understand posture and body mechanics Goal status: INITIAL  2.  Increase lumbar ROM to WNL's Goal status: INITIAL  3.  Increase flexibility of the right quad to when she is in prone passively we can get her heel to her buttock Goal status: INITIAL  4.  Report pain decreased 50% Goal status: INITIAL  5.  Independent and safe with gym program Goal status: INITIAL  PLAN:  PT FREQUENCY: 1-2x/week  PT DURATION: 12 weeks  PLANNED INTERVENTIONS: Therapeutic exercises, Therapeutic activity, Neuromuscular re-education, Balance training, Gait training, Patient/Family education, Self Care, Joint mobilization, Dry Needling, Electrical stimulation, Spinal mobilization, Taping, Traction, and Manual therapy.  PLAN FOR NEXT SESSION: assess last treatment   Sumner Boast, PT 12/29/2022, 2:44 PM

## 2022-12-30 ENCOUNTER — Encounter (HOSPITAL_BASED_OUTPATIENT_CLINIC_OR_DEPARTMENT_OTHER): Payer: Self-pay | Admitting: General Surgery

## 2022-12-30 NOTE — Progress Notes (Signed)
Spoke w/ via phone for pre-op interview--- [place]----    NONE           Lab results------ COVID test -----patient states asymptomatic no test needed Arrive at -------0530 NPO after MN NO Solid Food.   Med rec completed Medications to take morning of surgery ----- Bring Albuterol inhaler Diabetic medication ----- Patient instructed no nail polish to be worn day of surgery Patient instructed to bring photo id and insurance card day of surgery Patient aware to have Driver (ride ) / caregiver Husband John    for 24 hours after surgery  Patient Special Instructions ----- Pre-Op special Istructions ----- Patient verbalized understanding of instructions that were given at this phone interview. Patient denies shortness of breath, chest pain, fever, cough at this phone interview.

## 2023-01-03 ENCOUNTER — Ambulatory Visit: Payer: BC Managed Care – PPO | Admitting: Physical Therapy

## 2023-01-03 ENCOUNTER — Encounter: Payer: Self-pay | Admitting: Physical Therapy

## 2023-01-03 DIAGNOSIS — M5459 Other low back pain: Secondary | ICD-10-CM

## 2023-01-03 DIAGNOSIS — R252 Cramp and spasm: Secondary | ICD-10-CM

## 2023-01-03 DIAGNOSIS — M546 Pain in thoracic spine: Secondary | ICD-10-CM

## 2023-01-03 NOTE — Therapy (Signed)
OUTPATIENT PHYSICAL THERAPY THORACOLUMBAR EVALUATION   Patient Name: Alyssa Cox MRN: [id number] DOB:11-Apr-1961, 62 y.o., female Today's Date: 01/03/2023  END OF SESSION:  PT End of Session - 01/03/23 0843     Visit Number 3    Date for PT Re-Evaluation 02/13/23    Authorization Type BC[employer]    PT Start Time 0840    PT Stop Time 0928    PT Time Calculation (min) 48 min    Activity Tolerance Patient tolerated treatment well    Behavior During Therapy [facility] for tasks assessed/performed             Past Medical History:  Diagnosis Date   Allergic rhinitis    Anal fissure    Chronic idiopathic constipation    Colon polyps    Endometriosis    Pulled muscle 10/2022   Past Surgical History:  Procedure Laterality Date   ABDOMINAL HYSTERECTOMY     CESAREAN SECTION     HEMORRHOID SURGERY     VASCULAR SURGERY     Patient Active Problem List   Diagnosis Date Noted   Lumbar radiculopathy 11/18/2022   Leukopenia 06/22/2022   Sciatica of right side 06/22/2022   Flu vaccine need 06/22/2022   Allergic rhinitis 12/17/2021   Other fatigue 12/17/2021   Allergic reaction 11/26/2021   Xerosis cutis 11/26/2021   Gynecologic exam normal 11/19/2021   History of total abdominal hysterectomy and bilateral salpingo-oophorectomy 11/19/2021   Vaginal atrophy 11/19/2021   Glaucoma 09/23/2021   Healthcare maintenance 09/23/2021   Plantar fasciitis 09/23/2021   Need for shingles vaccine 09/23/2021    PCP: Ethelene Hal  REFERRING PROVIDER: Franne Grip DIAG: Low back pain  Rationale for Evaluation and Treatment: Rehabilitation  THERAPY DIAG:  Other low back pain  Cramp and spasm  Pain in thoracic spine  ONSET DATE: 11/14/22  SUBJECTIVE:                                                                                                                                                                                           SUBJECTIVE STATEMENT: Patient reports that she did use  her elliptical 5 minutes a few times over the weekend, no back pain but did feel it in the right leg, thought the massage felt good PERTINENT HISTORY:  See above  PAIN:  Are you having pain? Yes: NPRS scale: 2/10 Pain location: right low back and into the thoracic area sciatic issues right LE Pain description: sciatic, tightness Aggravating factors: bending, twisting, pain 9/10 Relieving factors: lie on floor, light stretching, at best pain a 1/10  PRECAUTIONS: None  WEIGHT BEARING RESTRICTIONS: No  FALLS:  Has patient fallen in last 6 months? Yes. Number of falls 1  LIVING ENVIRONMENT: Lives with: lives with their family Lives in: House/apartment Stairs: Yes: Internal: 16 steps; can reach both Has following equipment at home: None  OCCUPATION: retired does some book keeping sitting  PLOF: Independent  PATIENT GOALS: less pain and feel stronger  NEXT MD VISIT: March 7th  OBJECTIVE:   DIAGNOSTIC FINDINGS:   1. No acute abnormality of the thoracic or lumbar spine. 2. Multilevel degenerative changes of the lumbar spine, greatest at L5-S1. 3. Chronic mild compression deformities of T8, T9, T11, and L1 vertebral bodies.  PATIENT SURVEYS:  FOTO 43.6 COGNITION: Overall cognitive status: Within functional limits for tasks assessed     SENSATION: WFL  MUSCLE LENGTH: Hamstrings: Right 50 deg; Left 60 deg Very tight calves and right quad  POSTURE: rounded shoulders, forward head, and decreased lumbar lordosis  PALPATION: Tight mild tenderness in the right lumbar area, very tender in the right buttock  LUMBAR ROM: decreased 25% with mild tgihtness and pain   LOWER EXTREMITY ROM:   tight HS, calf and piriformis on the right   LOWER EXTREMITY MMT:    MMT Right eval Left eval  Hip flexion 4- 4-  Hip extension    Hip abduction 4- 4-  Hip adduction    Hip internal rotation    Hip external rotation    Knee flexion 4 4  Knee extension 4 4  Ankle dorsiflexion     Ankle plantarflexion    Ankle inversion    Ankle eversion     (Blank rows = not tested)  LUMBAR SPECIAL TESTS:  Straight leg raise test: Positive and Thomas test: Positive  GAIT: WFL's just a slight limp on the right  TODAY'S TREATMENT:                                                                                                                              DATE:   01/03/23 Bike level 4 x 6 minutes Straight arm pulls 5# cues for posture 5# Calf stretches 20# Rows 20# lats 40# leg press 20# leg curls 2x10 Green tband AR press Feet on ball K2C, trunk rotation, small bridge, isometric abs Passive LE stretches STM with her left side lying to the right paraspinals  12/29/22 Reviewed HEP, some verbal and tactile cues needed Nustep level 5 x 5 minutes Reviewed posture on an elliptical that she has at home Calf stretches Passive HS and piriformis stretches 5# straight arm pulls 15# rows 20# lats all with cues for posture and form Feet on ball K2C, trunk rotaiton, small bridge and isometric abs STM to the lumbar and thoracic area  PATIENT EDUCATION:  Education details: POC/HEP Person educated: Patient Education method: Consulting civil engineer, Demonstration, Corporate treasurer cues, Verbal cues, and Handouts Education comprehension: verbalized understanding  HOME EXERCISE PROGRAM: Access Code: [email or web address] URL: [email or web address] Date: 12/15/2022 Prepared by: Dale with  Anchored Resistance  - 1 x daily - 7 x weekly - 3 sets - 10 reps - 3 hold - Shoulder extension with resistance - Neutral  - 1 x daily - 7 x weekly - 3 sets - 10 reps - 3 hold - Supine Quadriceps Stretch with Strap on Table  - 2 x daily - 7 x weekly - 1 sets - 5 reps - 20 hold  ASSESSMENT:  CLINICAL IMPRESSION: Patient feeling well, has tried to watch her posture more, she reports doing some painting yesterday ad ha a little more neck and upper back pain.  She is very  tight in the LE's and does not like the stretches but is tolerating them and trying to do them at home.   OBJECTIVE IMPAIRMENTS: cardiopulmonary status limiting activity, decreased activity tolerance, decreased coordination, decreased endurance, decreased mobility, difficulty walking, decreased ROM, decreased strength, increased muscle spasms, impaired flexibility, improper body mechanics, postural dysfunction, and pain.   REHAB POTENTIAL: Good  CLINICAL DECISION MAKING: Stable/uncomplicated  EVALUATION COMPLEXITY: Low   GOALS: Goals reviewed with patient? Yes  SHORT TERM GOALS: Target date: 12/26/22  Independent with initial HEP Goal status: met  LONG TERM GOALS: Target date: 03/15/23  Understand posture and body mechanics Goal status:ongoing 01/03/23  2.  Increase lumbar ROM to WNL's Goal status: INITIAL  3.  Increase flexibility of the right quad to when she is in prone passively we can get her heel to her buttock Goal status: INITIAL  4.  Report pain decreased 50% Goal status:ongoing 01/03/23  5.  Independent and safe with gym program Goal status: INITIAL  PLAN:  PT FREQUENCY: 1-2x/week  PT DURATION: 12 weeks  PLANNED INTERVENTIONS: Therapeutic exercises, Therapeutic activity, Neuromuscular re-education, Balance training, Gait training, Patient/Family education, Self Care, Joint mobilization, Dry Needling, Electrical stimulation, Spinal mobilization, Taping, Traction, and Manual therapy.  PLAN FOR NEXT SESSION: continue to progress strength and HEP   , W, PT 01/03/2023, 8:44 AM

## 2023-01-04 ENCOUNTER — Ambulatory Visit (HOSPITAL_BASED_OUTPATIENT_CLINIC_OR_DEPARTMENT_OTHER)
Admission: RE | Admit: 2023-01-04 | Discharge: 2023-01-04 | Disposition: A | Discharge status: Home / Self Care | Payer: BC Managed Care – PPO | Source: Ambulatory Visit | Attending: General Surgery | Admitting: General Surgery

## 2023-01-04 ENCOUNTER — Ambulatory Visit (HOSPITAL_BASED_OUTPATIENT_CLINIC_OR_DEPARTMENT_OTHER): Payer: BC Managed Care – PPO | Admitting: Anesthesiology

## 2023-01-04 ENCOUNTER — Encounter (HOSPITAL_BASED_OUTPATIENT_CLINIC_OR_DEPARTMENT_OTHER)
Admission: RE | Disposition: A | Discharge status: Home / Self Care | Payer: Self-pay | Source: Ambulatory Visit | Attending: General Surgery

## 2023-01-04 ENCOUNTER — Other Ambulatory Visit: Payer: Self-pay | Admitting: Obstetrics and Gynecology

## 2023-01-04 ENCOUNTER — Other Ambulatory Visit: Payer: Self-pay

## 2023-01-04 ENCOUNTER — Encounter (HOSPITAL_BASED_OUTPATIENT_CLINIC_OR_DEPARTMENT_OTHER): Payer: Self-pay | Admitting: General Surgery

## 2023-01-04 DIAGNOSIS — N952 Postmenopausal atrophic vaginitis: Secondary | ICD-10-CM

## 2023-01-04 DIAGNOSIS — K601 Chronic anal fissure: Secondary | ICD-10-CM | POA: Insufficient documentation

## 2023-01-04 DIAGNOSIS — K624 Stenosis of anus and rectum: Secondary | ICD-10-CM | POA: Diagnosis present

## 2023-01-04 DIAGNOSIS — Z87891 Personal history of nicotine dependence: Secondary | ICD-10-CM | POA: Diagnosis not present

## 2023-01-04 HISTORY — PX: SPHINCTEROTOMY: SHX5279

## 2023-01-04 HISTORY — PX: ANAL FISSURE REPAIR: SHX2312

## 2023-01-04 SURGERY — SPHINCTEROTOMY, ANAL
Anesthesia: Monitor Anesthesia Care | Site: Rectum

## 2023-01-04 MED ORDER — MIDAZOLAM HCL 2 MG/2ML IJ SOLN
INTRAMUSCULAR | Status: AC
  Filled 2023-01-04: qty 2

## 2023-01-04 MED ORDER — FENTANYL CITRATE (PF) 250 MCG/5ML IJ SOLN
INTRAMUSCULAR | Status: DC | PRN
  Administered 2023-01-04: 50 ug via INTRAVENOUS
  Administered 2023-01-04 (×2): 25 ug via INTRAVENOUS

## 2023-01-04 MED ORDER — ONABOTULINUMTOXINA 100 UNITS IJ SOLR
INTRAMUSCULAR | Status: DC | PRN
  Administered 2023-01-04: 80 [IU]

## 2023-01-04 MED ORDER — PROPOFOL 10 MG/ML IV BOLUS
INTRAVENOUS | Status: DC | PRN
  Administered 2023-01-04 (×3): 20 mg via INTRAVENOUS

## 2023-01-04 MED ORDER — BUPIVACAINE LIPOSOME 1.3 % IJ SUSP: 20.0000 mL | Freq: Once | INTRAMUSCULAR | Status: DC

## 2023-01-04 MED ORDER — OXYCODONE HCL 5 MG PO TABS: 5.0000 mg | ORAL_TABLET | Freq: Once | ORAL | Status: DC | PRN

## 2023-01-04 MED ORDER — ONABOTULINUMTOXINA 100 UNITS IJ SOLR
INTRAMUSCULAR | Status: AC
  Filled 2023-01-04: qty 100

## 2023-01-04 MED ORDER — PROPOFOL 10 MG/ML IV BOLUS
INTRAVENOUS | Status: AC
  Filled 2023-01-04: qty 20

## 2023-01-04 MED ORDER — FENTANYL CITRATE (PF) 100 MCG/2ML IJ SOLN
INTRAMUSCULAR | Status: AC
  Filled 2023-01-04: qty 2

## 2023-01-04 MED ORDER — LIDOCAINE 2% (20 MG/ML) 5 ML SYRINGE
INTRAMUSCULAR | Status: DC | PRN
  Administered 2023-01-04: 40 mg via INTRAVENOUS

## 2023-01-04 MED ORDER — 0.9 % SODIUM CHLORIDE (POUR BTL) OPTIME
TOPICAL | Status: DC | PRN
  Administered 2023-01-04: 1000 mL

## 2023-01-04 MED ORDER — SODIUM CHLORIDE (PF) 0.9 % IJ SOLN
INTRAMUSCULAR | Status: AC
  Filled 2023-01-04: qty 10

## 2023-01-04 MED ORDER — LACTATED RINGERS IV SOLN: INTRAVENOUS | Status: DC

## 2023-01-04 MED ORDER — MIDAZOLAM HCL 2 MG/2ML IJ SOLN
INTRAMUSCULAR | Status: DC | PRN
  Administered 2023-01-04: 1 mg via INTRAVENOUS

## 2023-01-04 MED ORDER — FENTANYL CITRATE (PF) 100 MCG/2ML IJ SOLN: 25.0000 ug | INTRAMUSCULAR | Status: DC | PRN

## 2023-01-04 MED ORDER — OXYCODONE HCL 5 MG/5ML PO SOLN: 5.0000 mg | Freq: Once | ORAL | Status: DC | PRN

## 2023-01-04 MED ORDER — TRAMADOL HCL 50 MG PO TABS: 50.0000 mg | ORAL_TABLET | Freq: Four times a day (QID) | ORAL | 1 refills | Status: DC | PRN

## 2023-01-04 MED ORDER — SODIUM CHLORIDE 0.9% FLUSH: 3.0000 mL | Freq: Two times a day (BID) | INTRAVENOUS | Status: DC

## 2023-01-04 MED ORDER — PROPOFOL 500 MG/50ML IV EMUL
INTRAVENOUS | Status: DC | PRN
  Administered 2023-01-04: 150 ug/kg/min via INTRAVENOUS

## 2023-01-04 MED ORDER — PHENYLEPHRINE 80 MCG/ML (10ML) SYRINGE FOR IV PUSH (FOR BLOOD PRESSURE SUPPORT)
PREFILLED_SYRINGE | INTRAVENOUS | Status: DC | PRN
  Administered 2023-01-04 (×2): 80 ug via INTRAVENOUS

## 2023-01-04 MED ORDER — BUPIVACAINE-EPINEPHRINE (PF) 0.25% -1:200000 IJ SOLN
INTRAMUSCULAR | Status: DC | PRN
  Administered 2023-01-04: 50 mL via SURGICAL_CAVITY

## 2023-01-04 MED ORDER — ACETAMINOPHEN 500 MG PO TABS
1000.0000 mg | ORAL_TABLET | ORAL | Status: AC
  Administered 2023-01-04: 1000 mg via ORAL

## 2023-01-04 MED ORDER — ONDANSETRON HCL 4 MG/2ML IJ SOLN
INTRAMUSCULAR | Status: DC | PRN
  Administered 2023-01-04: 4 mg via INTRAVENOUS

## 2023-01-04 MED ORDER — ACETAMINOPHEN 500 MG PO TABS
ORAL_TABLET | ORAL | Status: AC
  Filled 2023-01-04: qty 2

## 2023-01-04 MED ORDER — BUPIVACAINE-EPINEPHRINE 0.25% -1:200000 IJ SOLN
INTRAMUSCULAR | Status: AC
  Filled 2023-01-04: qty 1

## 2023-01-04 MED ORDER — BUPIVACAINE LIPOSOME 1.3 % IJ SUSP
INTRAMUSCULAR | Status: AC
  Filled 2023-01-04: qty 20

## 2023-01-04 MED ORDER — ONDANSETRON HCL 4 MG/2ML IJ SOLN: 4.0000 mg | Freq: Four times a day (QID) | INTRAMUSCULAR | Status: DC | PRN

## 2023-01-04 SURGICAL SUPPLY — 61 items
APL SKNCLS STERI-STRIP NONHPOA (GAUZE/BANDAGES/DRESSINGS) ×2
BENZOIN TINCTURE PRP APPL 2/3 (GAUZE/BANDAGES/DRESSINGS) ×2 IMPLANT
BLADE EXTENDED COATED 6.5IN (ELECTRODE) IMPLANT
BLADE SURG 10 STRL SS (BLADE) IMPLANT
BLADE SURG 15 STRL LF DISP TIS (BLADE) ×1 IMPLANT
BLADE SURG 15 STRL SS (BLADE) ×1
BRIEF MESH DISP LRG (UNDERPADS AND DIAPERS) ×1 IMPLANT
COVER BACK TABLE 60X90IN (DRAPES) ×1 IMPLANT
COVER MAYO STAND STRL (DRAPES) ×1 IMPLANT
DRAPE HYSTEROSCOPY (MISCELLANEOUS) IMPLANT
DRAPE LAPAROTOMY 100X72 PEDS (DRAPES) ×1 IMPLANT
DRAPE SHEET LG 3/4 BI-LAMINATE (DRAPES) IMPLANT
DRAPE UTILITY XL STRL (DRAPES) ×1 IMPLANT
ELECT REM PT RETURN 9FT ADLT (ELECTROSURGICAL) ×1
ELECTRODE REM PT RTRN 9FT ADLT (ELECTROSURGICAL) ×1 IMPLANT
GAUZE 4X4 16PLY ~~LOC~~+RFID DBL (SPONGE) ×1 IMPLANT
GAUZE PAD ABD 8X10 STRL (GAUZE/BANDAGES/DRESSINGS) ×1 IMPLANT
GAUZE SPONGE 4X4 12PLY STRL (GAUZE/BANDAGES/DRESSINGS) ×1 IMPLANT
GLOVE BIO SURGEON STRL SZ 6.5 (GLOVE) ×1 IMPLANT
GLOVE BIOGEL PI IND STRL 7.0 (GLOVE) ×1 IMPLANT
GLOVE INDICATOR 6.5 STRL GRN (GLOVE) ×1 IMPLANT
GOWN STRL REUS W/TWL XL LVL3 (GOWN DISPOSABLE) ×1 IMPLANT
HYDROGEN PEROXIDE 16OZ (MISCELLANEOUS) ×1 IMPLANT
IV CATH 14GX2 1/4 (CATHETERS) ×1 IMPLANT
IV CATH 18G SAFETY (IV SOLUTION) ×1 IMPLANT
KIT SIGMOIDOSCOPE (SET/KITS/TRAYS/PACK) IMPLANT
KIT TURNOVER CYSTO (KITS) ×1 IMPLANT
LEGGING LITHOTOMY PAIR STRL (DRAPES) IMPLANT
LOOP VASCLR MAXI BLUE 18IN ST (MISCELLANEOUS) IMPLANT
LOOP VASCULAR MAXI 18 BLUE (MISCELLANEOUS)
LOOPS VASCLR MAXI BLUE 18IN ST (MISCELLANEOUS) IMPLANT
NDL HYPO 22X1.5 SAFETY MO (MISCELLANEOUS) ×1 IMPLANT
NDL SAFETY ECLIP 18X1.5 (MISCELLANEOUS) IMPLANT
NEEDLE HYPO 22X1.5 SAFETY MO (MISCELLANEOUS) ×1 IMPLANT
NS IRRIG 500ML POUR BTL (IV SOLUTION) ×1 IMPLANT
PACK BASIN DAY SURGERY FS (CUSTOM PROCEDURE TRAY) ×1 IMPLANT
PAD ARMBOARD 7.5X6 YLW CONV (MISCELLANEOUS) IMPLANT
PENCIL SMOKE EVACUATOR (MISCELLANEOUS) ×1 IMPLANT
SLEEVE SCD COMPRESS KNEE MED (STOCKING) ×1 IMPLANT
SPIKE FLUID TRANSFER (MISCELLANEOUS) ×1 IMPLANT
SPONGE HEMORRHOID 8X3CM (HEMOSTASIS) IMPLANT
SPONGE SURGIFOAM ABS GEL 12-7 (HEMOSTASIS) IMPLANT
SUCTION FRAZIER HANDLE 10FR (MISCELLANEOUS)
SUCTION TUBE FRAZIER 10FR DISP (MISCELLANEOUS) IMPLANT
SUT CHROMIC 2 0 SH (SUTURE) IMPLANT
SUT CHROMIC 3 0 SH 27 (SUTURE) IMPLANT
SUT ETHIBOND 0 (SUTURE) IMPLANT
SUT VIC AB 2-0 SH 27 (SUTURE)
SUT VIC AB 2-0 SH 27XBRD (SUTURE) IMPLANT
SUT VIC AB 3-0 SH 18 (SUTURE) IMPLANT
SUT VIC AB 3-0 SH 27 (SUTURE)
SUT VIC AB 3-0 SH 27XBRD (SUTURE) IMPLANT
SYR CONTROL 10ML LL (SYRINGE) ×1 IMPLANT
TOWEL OR 17X24 6PK STRL BLUE (TOWEL DISPOSABLE) ×1 IMPLANT
TRAP FLUID SMOKE EVACUATOR (MISCELLANEOUS) ×1 IMPLANT
TRAY DSU PREP LF (CUSTOM PROCEDURE TRAY) ×1 IMPLANT
TUBE CONNECTING 12X1/4 (SUCTIONS) ×1 IMPLANT
UNDERPAD 30X36 HEAVY ABSORB (UNDERPADS AND DIAPERS) ×1 IMPLANT
VASCULAR TIE MAXI BLUE 18IN ST (MISCELLANEOUS)
WATER STERILE IRR 500ML POUR (IV SOLUTION) ×1 IMPLANT
YANKAUER SUCT BULB TIP NO VENT (SUCTIONS) ×1 IMPLANT

## 2023-01-04 NOTE — Anesthesia Postprocedure Evaluation (Signed)
Anesthesia Post Note  Patient: Alyssa Cox  Procedure(s) Performed: CHEMICAL SPHINCTEROTOMY (Rectum) ANAL DILATION (Rectum)     Patient location during evaluation: PACU Anesthesia Type: MAC Level of consciousness: awake and alert Pain management: pain level controlled Vital Signs Assessment: post-procedure vital signs reviewed and stable Respiratory status: spontaneous breathing, nonlabored ventilation, respiratory function stable and patient connected to nasal cannula oxygen Cardiovascular status: stable and blood pressure returned to baseline Postop Assessment: no apparent nausea or vomiting Anesthetic complications: no   No notable events documented.  Last Vitals:  Vitals:   01/04/23 0830 01/04/23 0903  BP: 118/67 117/72  Pulse: 78 69  Resp: 20 16  Temp: 36.6 C 36.6 C  SpO2: 98% 100%    Last Pain:  Vitals:   01/04/23 0903  TempSrc:   PainSc: 7                  , S

## 2023-01-04 NOTE — Transfer of Care (Signed)
Immediate Anesthesia Transfer of Care Note  Patient: LOTA PERLMAN  Procedure(s) Performed: CHEMICAL SPHINCTEROTOMY (Rectum) ANAL DILATION (Rectum)  Patient Location: PACU  Anesthesia Type:MAC  Level of Consciousness: awake, alert , and oriented  Airway & Oxygen Therapy: Patient Spontanous Breathing  Post-op Assessment: Report given to RN and Post -op Vital signs reviewed and stable  Post vital signs: Reviewed and stable  Last Vitals:  Vitals Value Taken Time  BP 109/52 01/04/23 0801  Temp    Pulse 93 01/04/23 0802  Resp 20 01/04/23 0802  SpO2 97 % 01/04/23 0802  Vitals shown include unvalidated device data.  Last Pain:  Vitals:   01/04/23 0610  TempSrc: Oral  PainSc: 4       Patients Stated Pain Goal: 5 ([id number])  Complications: No notable events documented.

## 2023-01-04 NOTE — Discharge Instructions (Addendum)
No acetaminophen/Tylenol until after 12:15pm today if needed for pain.    Beginning the day after surgery:  You may sit in a tub of warm water 2-3 times a day to relieve discomfort.  Use a dilator daily with lubricant to keep area open while healing.  Increase size as it becomes easier to do.  Eat a regular diet high in fiber.  Avoid foods that give you constipation or diarrhea.  Avoid foods that are difficult to digest, such as seeds, nuts, corn or popcorn.  Do not go any longer than 2 days without a bowel movement.  You may take a dose of Milk of Magnesia if you become constipated.    Drink 6-8 glasses of water daily.  Walking is encouraged.  Avoid strenuous activity and heavy lifting for one month after surgery.    Call the office if you have any questions or concerns.  Call immediately if you develop:  Excessive rectal bleeding (more than a cup or passing large clots) Increased discomfort Fever greater than 100 F Difficulty urinating       Post Anesthesia Home Care Instructions  Activity: Get plenty of rest for the remainder of the day. A responsible individual must stay with you for 24 hours following the procedure.  For the next 24 hours, DO NOT: -Drive a car -Paediatric nurse -Drink alcoholic beverages -Take any medication unless instructed by your physician -Make any legal decisions or sign important papers.  Meals: Start with liquid foods such as gelatin or soup. Progress to regular foods as tolerated. Avoid greasy, spicy, heavy foods. If nausea and/or vomiting occur, drink only clear liquids until the nausea and/or vomiting subsides. Call your physician if vomiting continues.  Special Instructions/Symptoms: Your throat may feel dry or sore from the anesthesia or the breathing tube placed in your throat during surgery. If this causes discomfort, gargle with warm salt water. The discomfort should disappear within 24 hours.       Information for Discharge  Teaching: EXPAREL (bupivacaine liposome injectable suspension)   Your surgeon or anesthesiologist gave you EXPAREL(bupivacaine) to help control your pain after surgery.  EXPAREL is a local anesthetic that provides pain relief by numbing the tissue around the surgical site. EXPAREL is designed to release pain medication over time and can control pain for up to 72 hours. Depending on how you respond to EXPAREL, you may require less pain medication during your recovery.  Possible side effects: Temporary loss of sensation or ability to move in the area where bupivacaine was injected. Nausea, vomiting, constipation Rarely, numbness and tingling in your mouth or lips, lightheadedness, or anxiety may occur. Call your doctor right away if you think you may be experiencing any of these sensations, or if you have other questions regarding possible side effects.  Follow all other discharge instructions given to you by your surgeon or nurse. Eat a healthy diet and drink plenty of water or other fluids.  If you return to the hospital for any reason within 96 hours following the administration of EXPAREL, it is important for health care providers to know that you have received this anesthetic. A teal colored band has been placed on your arm with the date, time and amount of EXPAREL you have received in order to alert and inform your health care providers. Please leave this armband in place for the full 96 hours following administration, and then you may remove the band.  Band to be removed on Saturday January 08, 2023.

## 2023-01-04 NOTE — Op Note (Signed)
01/04/2023  7:52 AM  PATIENT:  Ernst Bowler  62 y.o. female  Patient Care Team: Libby Maw, MD as PCP - General (Family Medicine)  PRE-OPERATIVE DIAGNOSIS:  ANAL STRICTURE/ ANAL FISSURE  POST-OPERATIVE DIAGNOSIS:  Anal stricture with fissure  PROCEDURE:  CHEMICAL SPHINCTEROTOMY ANAL DILATION   Surgeon(s): Leighton Ruff, MD  ASSISTANT: none   ANESTHESIA:   local and MAC  SPECIMEN:  No Specimen  DISPOSITION OF SPECIMEN:  N/A  COUNTS:  YES  PLAN OF CARE: Discharge to home after PACU  PATIENT DISPOSITION:  PACU - hemodynamically stable.  INDICATION: 62 y.o. F with anal fissure not resolving with medical treatment   OR FINDINGS: chronic anal fissure with stricuture  DESCRIPTION: the patient was identified in the preoperative holding area and taken to the OR where they were laid on the operating room table.  MAC anesthesia was induced without difficulty. The patient was then positioned in prone jackknife position with buttocks gently taped apart.  The patient was then prepped and draped in usual sterile fashion.  SCDs were noted to be in place prior to the initiation of anesthesia. A surgical timeout was performed indicating the correct patient, procedure, positioning and need for preoperative antibiotics.  A rectal block was performed using Marcaine with epinephrine mixed with Experel.    I began with a digital rectal exam.  I gently dilated the anal opening to one finger breadth.  I continued gentle dilation until ~1.5 finger breadths.  I then injected 80 units botox into the intersphincteric space to assist with post operative healing of her fissure.  A dressing was applied.  The patient was awakened from anesthesia and sent to the PACU in stable condition.  All counts were correct per OR staff.  Rosario Adie, MD  [facility] [facility]

## 2023-01-04 NOTE — Interval H&P Note (Signed)
History and Physical Interval Note:  01/04/2023 7:02 AM  Alyssa Cox  has presented today for surgery, with the diagnosis of ANAL STRICTURE/ ANAL FISSURE.  The various methods of treatment have been discussed with the patient and family. After consideration of risks, benefits and other options for treatment, the patient has consented to  Procedure(s): CHEMICAL SPHINCTEROTOMY (N/A) ANAL DILATION (N/A) as a surgical intervention.  The patient's history has been reviewed, patient examined, no change in status, stable for surgery.  I have reviewed the patient's chart and labs.  Questions were answered to the patient's satisfaction.     Rosario Adie, MD  [facility] [place] [facility]

## 2023-01-04 NOTE — Anesthesia Preprocedure Evaluation (Signed)
Anesthesia Evaluation  Patient identified by MRN, date of birth, ID band Patient awake    Reviewed: Allergy & Precautions, H&P , NPO status , Patient's Chart, lab work & pertinent test results  Airway Mallampati: II   Neck ROM: full    Dental   Pulmonary former smoker   breath sounds clear to auscultation       Cardiovascular negative cardio ROS  Rhythm:regular Rate:Normal     Neuro/Psych    GI/Hepatic Chronic constipation   Endo/Other    Renal/GU      Musculoskeletal   Abdominal   Peds  Hematology   Anesthesia Other Findings   Reproductive/Obstetrics S/p hysterectomy                             Anesthesia Physical Anesthesia Plan  ASA: 2  Anesthesia Plan: MAC   Post-op Pain Management:    Induction: Intravenous  PONV Risk Score and Plan: 2 and Propofol infusion and Treatment may vary due to age or medical condition  Airway Management Planned: Simple Face Mask  Additional Equipment:   Intra-op Plan:   Post-operative Plan:   Informed Consent: I have reviewed the patients History and Physical, chart, labs and discussed the procedure including the risks, benefits and alternatives for the proposed anesthesia with the patient or authorized representative who has indicated his/her understanding and acceptance.     Dental advisory given  Plan Discussed with: CRNA, Anesthesiologist and Surgeon  Anesthesia Plan Comments:        Anesthesia Quick Evaluation

## 2023-01-05 ENCOUNTER — Encounter (HOSPITAL_BASED_OUTPATIENT_CLINIC_OR_DEPARTMENT_OTHER): Payer: Self-pay | Admitting: General Surgery

## 2023-01-05 ENCOUNTER — Ambulatory Visit: Payer: BC Managed Care – PPO | Attending: Gastroenterology | Admitting: Physical Therapy

## 2023-01-05 DIAGNOSIS — M6289 Other specified disorders of muscle: Secondary | ICD-10-CM | POA: Insufficient documentation

## 2023-01-05 DIAGNOSIS — R252 Cramp and spasm: Secondary | ICD-10-CM

## 2023-01-05 DIAGNOSIS — M546 Pain in thoracic spine: Secondary | ICD-10-CM

## 2023-01-05 DIAGNOSIS — M5459 Other low back pain: Secondary | ICD-10-CM

## 2023-01-05 DIAGNOSIS — K5902 Outlet dysfunction constipation: Secondary | ICD-10-CM | POA: Diagnosis not present

## 2023-01-05 NOTE — Therapy (Signed)
OUTPATIENT PHYSICAL THERAPY TREATMENT NOTE   Patient Name: Alyssa Cox MRN: [id number] DOB:03/29/1961, 62 y.o., female Today's Date: 01/05/2023  PCP: Libby Maw, MD  REFERRING PROVIDER: Mauri Pole, MD   END OF SESSION:   PT End of Session - 01/05/23 1021     Visit Number 2    Date for PT Re-Evaluation 02/13/23    Authorization Type BCBS    PT Start Time 1015    PT Stop Time 1055    PT Time Calculation (min) 40 min    Activity Tolerance Patient tolerated treatment well    Behavior During Therapy WFL for tasks assessed/performed             Past Medical History:  Diagnosis Date   Allergic rhinitis    Anal fissure    Chronic idiopathic constipation    Colon polyps    Endometriosis    Pulled muscle 10/2022   Past Surgical History:  Procedure Laterality Date   ABDOMINAL HYSTERECTOMY     ANAL FISSURE REPAIR N/A 01/04/2023   Procedure: ANAL DILATION;  Surgeon: Leighton Ruff, MD;  Location: [place];  Service: General;  Laterality: N/A;   CESAREAN SECTION     HEMORRHOID SURGERY     SPHINCTEROTOMY N/A 01/04/2023   Procedure: CHEMICAL SPHINCTEROTOMY;  Surgeon: Leighton Ruff, MD;  Location: [place];  Service: General;  Laterality: N/A;   VASCULAR SURGERY     Patient Active Problem List   Diagnosis Date Noted   Lumbar radiculopathy 11/18/2022   Leukopenia 06/22/2022   Sciatica of right side 06/22/2022   Flu vaccine need 06/22/2022   Allergic rhinitis 12/17/2021   Other fatigue 12/17/2021   Allergic reaction 11/26/2021   Xerosis cutis 11/26/2021   Gynecologic exam normal 11/19/2021   History of total abdominal hysterectomy and bilateral salpingo-oophorectomy 11/19/2021   Vaginal atrophy 11/19/2021   Glaucoma 09/23/2021   Healthcare maintenance 09/23/2021   Plantar fasciitis 09/23/2021   Need for shingles vaccine 09/23/2021   REFERRING DIAG: K59.02 (ICD-10-CM) - Dyssynergic defecationM62.89 (ICD-10-CM) -  Pelvic floor dysfunction in female   THERAPY DIAG:  Other lack of coordination   Cramp and spasm   Other low back pain   Pain in thoracic spine   Rationale for Evaluation and Treatment: Rehabilitation   ONSET DATE: 12/2021   SUBJECTIVE:                                                                                                                                                                                            SUBJECTIVE STATEMENT: Patient had botox yesterday and is to be  using the anal dilators 2 times per day. The Botox is to relax the anal sphincter. The fissure looks like it healed. I am having therapy for my back at [facility].     PAIN:  Are you having pain? Yes NPRS scale: 0/10 Pain location:  buttocks and low back   Pain type: pulling, cramping Pain description: constant    Aggravating factors: bowel movement, bending forward to pick up something, going sideways, lifting items, standing Relieving factors: sit with heating pad, stretch   PRECAUTIONS: None   WEIGHT BEARING RESTRICTIONS: No   FALLS:  Has patient fallen in last 6 months? No   LIVING ENVIRONMENT: Lives with: lives with their spouse     OCCUPATION: walking 3 miles   PLOF: Independent   PATIENT GOALS: poop normal   PERTINENT HISTORY:  Abdominal Hysterectomy; cesarean section; Hemorrhoid surgery; Anal fissure; Endometriosis   BOWEL MOVEMENT: Pain with bowel movement: Yes Type of bowel movement:Type (Bristol Stool Scale) Type 1 or 4, Frequency every other day to 3 days, Strain Yes, and Splinting none Fully empty rectum: No Leakage: No Fiber supplement: Yes: crushed flax seed, stool softener, Miralax, Doculax   URINATION: Pain with urination: No Fully empty bladder: No, sometimes has to go 2 times Stream: Strong Urgency: Yes: depending on how much water she drinks Frequency: 2-3 hours Leakage:  none   INTERCOURSE: no pain with intercourse   PREGNANCY: C-section deliveries  2     OBJECTIVE:    COGNITION: Overall cognitive status: Within functional limits for tasks assessed                          SENSATION: Light touch: Appears intact Proprioception: Appears intact     POSTURE: increased thoracic kyphosis   PELVIC ALIGNMENT:   LUMBARAROM/PROM:   A/PROM A/PROM  eval  Flexion Decreased by 25%  Extension full  Right lateral flexion Decreased by 25%  Left lateral flexion Decreased by 25% with pain  Right rotation Decreased by 25%  Left rotation Decreased by 25%   (Blank rows = not tested)   LOWER EXTREMITY ROM:      Passive ROM Right eval Left eval  Hip external rotation 40 45   (Blank rows = not tested)   LOWER EXTREMITY MMT:   MMT Right eval Left eval  Hip extension 3+/5 3+/5  Hip abduction 3+/5 4/5    PALPATION:   General  restrictions in the c-section scar, tightness in the lower abdomen and by the rib cage; able to move the lower rib cage with good movement ; tenderness in gluteus medius, quadratus, gluteus maximus                External Perineal Exam tenderness located on the levator ani, able to contract and bulge the anus                             Internal Pelvic Floor not assessed due to her seeing the doctor next week and do not want to tear any tissue   Patient confirms identification and approves PT to assess internal pelvic floor and treatment No   PELVIC MMT:   MMT eval  Vaginal    Internal Anal Sphincter    External Anal Sphincter    Puborectalis    Diastasis Recti    (Blank rows = not tested)           TODAY'S  TREATMENT:    01/05/23 Manual: Soft tissue mobilization: To address for dry needling Manual work to right quadratus, along the lumbar paraspinals, and right gluteal to elongate after dry needling Trigger Point Dry-Needling  Treatment instructions: Expect mild to moderate muscle soreness. S/S of pneumothorax if dry needled over a lung field, and to seek immediate medical attention should they  occur. Patient verbalized understanding of these instructions and education. Patient Consent Given: Yes Education handout provided: Previously provided Muscles treated:  Electrical stimulation performed: No Parameters: N/A Treatment response/outcome: right quadratus, right lumbar multifid, right gluteus medius Neuromuscular re-education: Pelvic floor contraction training: Educated patient on how to use the anal dilator to expand the tissue after her Botox treatment. She performed diaphragmatic breathing first then inserted the dilator, relaxed with legs on bolster, listen to pelvic floor meditation      PATIENT EDUCATION:  01/05/23 Education details: educated patient on using anal dilators and gave her slippery stuff samples to use, educated on using vaginal moisturizer daily for her dryness Person educated: Patient Education method: Explanation Education comprehension: verbalized understanding   HOME EXERCISE PROGRAM: See above.    ASSESSMENT:   CLINICAL IMPRESSION: Patient is a 62 y.o. female  who was seen today for physical therapy  treatment for pelvic floor dysfunction and dyssynergic defecation.  Patient had botox to the pelvic floor yesterday. She is to start using the anal dilators today and not sure how to use correctly. Patient used the dilator in therapy and understands how to use at home with listening to meditation.  Patient will benefit from skilled therapy to improve pelvic floor coordination with bowel movents and reduce her pain.    OBJECTIVE IMPAIRMENTS: decreased coordination, decreased strength, increased fascial restrictions, increased muscle spasms, and pain.    ACTIVITY LIMITATIONS: lifting, bending, and toileting   PARTICIPATION LIMITATIONS: community activity   PERSONAL FACTORS: Age, Time since onset of injury/illness/exacerbation, and 1-2 comorbidities: Abdominal Hysterectomy; cesarean section; Hemorrhoid surgery; Anal fissure; Endometriosis  are also  affecting patient's functional outcome.    REHAB POTENTIAL: Excellent   CLINICAL DECISION MAKING: Evolving/moderate complexity   EVALUATION COMPLEXITY: Moderate     GOALS: Goals reviewed with patient? Yes   SHORT TERM GOALS: Target date: 12/22/22   Patient is independent with the use of the anal dilators Baseline: Goal status: Met 01/05/23   2.  Patient allows therapist to assess the anal sphincter rectal strength and work on the anal sphincter.  Baseline:  Goal status: INITIAL   3.  Patient reports her back to right calf pain decreased >/= 25% due to improved tissue mobiltiy.  Baseline:  Goal status: INITIAL     LONG TERM GOALS: Target date: 02/15/23   Patient independent with advanced HEP for core and pelvic floor to improve bowel movements.  Baseline:  Goal status: INITIAL   2.  Patient pain with bowel movements in her back to right calf decreased >/= 75% due to improved tissue mobility.  Baseline:  Goal status: INITIAL   3.  Patient is able to fully empty her rectum due to full relaxation of the rectum and improved tissue mobility of the abdomen.  Baseline:  Goal status: INITIAL   4.  Patient reports she is straining </= 50% due to improved coordination of the pelvic floor muscles.  Baseline:  Goal status: INITIAL       PLAN:   PT FREQUENCY: 1x/week   PT DURATION: 12 weeks   PLANNED INTERVENTIONS: Therapeutic exercises, Therapeutic activity, Neuromuscular re-education, Patient/Family education,  Joint mobilization, Dry Needling, Electrical stimulation, Spinal mobilization, Cryotherapy, Moist heat, Splintting, Taping, Ultrasound, Biofeedback, and Manual therapy   PLAN FOR NEXT SESSION: dry needling to the right quadratus, along the gluteals,and piriformis,  see if can assess the rectum, joint mobilization to thoracic lumbar, abdominal work, work on rectum, see how the anal sphincter is going   Earlie Counts, PT 01/05/23 11:01 AM

## 2023-01-06 ENCOUNTER — Encounter: Payer: BC Managed Care – PPO | Admitting: Physical Therapy

## 2023-01-10 ENCOUNTER — Encounter: Payer: Self-pay | Admitting: Physical Therapy

## 2023-01-10 ENCOUNTER — Ambulatory Visit: Payer: BC Managed Care – PPO | Admitting: Physical Therapy

## 2023-01-10 DIAGNOSIS — M5459 Other low back pain: Secondary | ICD-10-CM | POA: Diagnosis not present

## 2023-01-10 DIAGNOSIS — M546 Pain in thoracic spine: Secondary | ICD-10-CM

## 2023-01-10 DIAGNOSIS — R252 Cramp and spasm: Secondary | ICD-10-CM

## 2023-01-10 NOTE — Therapy (Signed)
OUTPATIENT PHYSICAL THERAPY THORACOLUMBAR TREATMENT   Patient Name: Alyssa Cox MRN: [id number] DOB:[birth year], 62 y.o., female Today's Date: 01/10/2023  END OF SESSION:  PT End of Session - 01/10/23 0837     Visit Number 3    Date for PT Re-Evaluation 02/13/23    Authorization Type BCBS    PT Start Time 0838    PT Stop Time 0928    PT Time Calculation (min) 50 min    Activity Tolerance Patient tolerated treatment well    Behavior During Therapy [facility] for tasks assessed/performed             Past Medical History:  Diagnosis Date   Allergic rhinitis    Anal fissure    Chronic idiopathic constipation    Colon polyps    Endometriosis    Pulled muscle 10/2022   Past Surgical History:  Procedure Laterality Date   ABDOMINAL HYSTERECTOMY     ANAL FISSURE REPAIR N/A 01/04/2023   Procedure: ANAL DILATION;  Surgeon: Leighton Ruff, MD;  Location: [place];  Service: General;  Laterality: N/A;   CESAREAN SECTION     HEMORRHOID SURGERY     SPHINCTEROTOMY N/A 01/04/2023   Procedure: CHEMICAL SPHINCTEROTOMY;  Surgeon: Leighton Ruff, MD;  Location: [place];  Service: General;  Laterality: N/A;   VASCULAR SURGERY     Patient Active Problem List   Diagnosis Date Noted   Lumbar radiculopathy 11/18/2022   Leukopenia 06/22/2022   Sciatica of right side 06/22/2022   Flu vaccine need 06/22/2022   Allergic rhinitis 12/17/2021   Other fatigue 12/17/2021   Allergic reaction 11/26/2021   Xerosis cutis 11/26/2021   Gynecologic exam normal 11/19/2021   History of total abdominal hysterectomy and bilateral salpingo-oophorectomy 11/19/2021   Vaginal atrophy 11/19/2021   Glaucoma 09/23/2021   Healthcare maintenance 09/23/2021   Plantar fasciitis 09/23/2021   Need for shingles vaccine 09/23/2021    PCP: Ethelene Hal  REFERRING PROVIDER: Franne Grip DIAG: Low back pain  Rationale for Evaluation and Treatment: Rehabilitation  THERAPY DIAG:   Other low back pain  Cramp and spasm  Pain in thoracic spine  ONSET DATE: 11/14/22  SUBJECTIVE:                                                                                                                                                                                           SUBJECTIVE STATEMENT: Patient reports that she is doing okay, had a procedure last week and reports that she is seeing our Pelvic floor specialist and they think dry needling may help. Reports twisted to pick up glasses and felt  the pain PERTINENT HISTORY:  See above  PAIN:  Are you having pain? Yes: NPRS scale: 3/10 Pain location: right low back and into the thoracic area sciatic issues right LE Pain description: sciatic, tightness Aggravating factors: bending, twisting, pain 9/10 Relieving factors: lie on floor, light stretching, at best pain a 1/10  PRECAUTIONS: None  WEIGHT BEARING RESTRICTIONS: No  FALLS:  Has patient fallen in last 6 months? Yes. Number of falls 1  LIVING ENVIRONMENT: Lives with: lives with their family Lives in: House/apartment Stairs: Yes: Internal: 16 steps; can reach both Has following equipment at home: None  OCCUPATION: retired does some book keeping sitting  PLOF: Independent  PATIENT GOALS: less pain and feel stronger  NEXT MD VISIT: March 7th  OBJECTIVE:   DIAGNOSTIC FINDINGS:   1. No acute abnormality of the thoracic or lumbar spine. 2. Multilevel degenerative changes of the lumbar spine, greatest at L5-S1. 3. Chronic mild compression deformities of T8, T9, T11, and L1 vertebral bodies.  PATIENT SURVEYS:  FOTO 43.6 COGNITION: Overall cognitive status: Within functional limits for tasks assessed     SENSATION: WFL  MUSCLE LENGTH: Hamstrings: Right 50 deg; Left 60 deg Very tight calves and right quad  POSTURE: rounded shoulders, forward head, and decreased lumbar lordosis  PALPATION: Tight mild tenderness in the right lumbar area, very  tender in the right buttock  LUMBAR ROM: decreased 25% with mild tgihtness and pain   LOWER EXTREMITY ROM:   tight HS, calf and piriformis on the right   LOWER EXTREMITY MMT:    MMT Right eval Left eval  Hip flexion 4- 4-  Hip extension    Hip abduction 4- 4-  Hip adduction    Hip internal rotation    Hip external rotation    Knee flexion 4 4  Knee extension 4 4  Ankle dorsiflexion    Ankle plantarflexion    Ankle inversion    Ankle eversion     (Blank rows = not tested)  LUMBAR SPECIAL TESTS:  Straight leg raise test: Positive and Thomas test: Positive  GAIT: WFL's just a slight limp on the right  TODAY'S TREATMENT:                                                                                                                              DATE:   01/10/23 Nustep level 5 x 6 minutes Straight arm pulls 5# Calf stretches Lats 20# Rows 20# 5# hip extenisona nd abduction bilateral 2x10 Leg press 30# 2x10 Back to walk W backs 3# Green tband horizontal abduction Feet on ball K2C, trunk rotation, small bridges and isometric abs Passive HS and piriformis stretch DN to the right gluteus medius, piriformis STM to the above and into the low back and rhomboid area  01/03/23 Bike level 4 x 6 minutes Straight arm pulls 5# cues for posture 5# Calf stretches 20# Rows 20# lats 40# leg press 20# leg curls 2x10 Green  tband AR press Feet on ball K2C, trunk rotation, small bridge, isometric abs Passive LE stretches STM with her left side lying to the right paraspinals  12/29/22 Reviewed HEP, some verbal and tactile cues needed Nustep level 5 x 5 minutes Reviewed posture on an elliptical that she has at home Calf stretches Passive HS and piriformis stretches 5# straight arm pulls 15# rows 20# lats all with cues for posture and form Feet on ball K2C, trunk rotaiton, small bridge and isometric abs STM to the lumbar and thoracic area  PATIENT EDUCATION:  Education  details: POC/HEP Person educated: Patient Education method: Explanation, Demonstration, Tactile cues, Verbal cues, and Handouts Education comprehension: verbalized understanding  HOME EXERCISE PROGRAM: Access Code: [email or web address] URL: [email or web address] Date: 12/15/2022 Prepared by: Lum Babe  Exercises - Standing Row with Anchored Resistance  - 1 x daily - 7 x weekly - 3 sets - 10 reps - 3 hold - Shoulder extension with resistance - Neutral  - 1 x daily - 7 x weekly - 3 sets - 10 reps - 3 hold - Supine Quadriceps Stretch with Strap on Table  - 2 x daily - 7 x weekly - 1 sets - 5 reps - 20 hold  ASSESSMENT:  CLINICAL IMPRESSION: Patient Has been doing well but did have an increase in lumbar/thoracic pain with bending and twisting to pick up her glasses.  The LE's are tight, she did have some good LTR's in the buttocks with DN OBJECTIVE IMPAIRMENTS: cardiopulmonary status limiting activity, decreased activity tolerance, decreased coordination, decreased endurance, decreased mobility, difficulty walking, decreased ROM, decreased strength, increased muscle spasms, impaired flexibility, improper body mechanics, postural dysfunction, and pain.   REHAB POTENTIAL: Good  CLINICAL DECISION MAKING: Stable/uncomplicated  EVALUATION COMPLEXITY: Low   GOALS: Goals reviewed with patient? Yes  SHORT TERM GOALS: Target date: 12/26/22  Independent with initial HEP Goal status: met  LONG TERM GOALS: Target date: 03/15/23  Understand posture and body mechanics Goal status:ongoing 01/03/23  2.  Increase lumbar ROM to WNL's Goal status: progressing  3.  Increase flexibility of the right quad to when she is in prone passively we can get her heel to her buttock Goal status: INITIAL  4.  Report pain decreased 50% Goal status:ongoing 01/03/23  5.  Independent and safe with gym program Goal status: progressing  PLAN:  PT FREQUENCY: 1-2x/week  PT DURATION: 12  weeks  PLANNED INTERVENTIONS: Therapeutic exercises, Therapeutic activity, Neuromuscular re-education, Balance training, Gait training, Patient/Family education, Self Care, Joint mobilization, Dry Needling, Electrical stimulation, Spinal mobilization, Taping, Traction, and Manual therapy.  PLAN FOR NEXT SESSION: continue to progress strength and HEP   , W, PT 01/10/2023, 8:39 AM

## 2023-01-11 ENCOUNTER — Encounter: Payer: Self-pay | Admitting: Physical Therapy

## 2023-01-11 ENCOUNTER — Ambulatory Visit (INDEPENDENT_AMBULATORY_CARE_PROVIDER_SITE_OTHER): Payer: BC Managed Care – PPO | Admitting: Family Medicine

## 2023-01-11 ENCOUNTER — Encounter: Payer: Self-pay | Admitting: Family Medicine

## 2023-01-11 VITALS — BP 132/76 | HR 71 | Temp 97.5°F | Ht 65.0 in | Wt 136.0 lb

## 2023-01-11 DIAGNOSIS — J4521 Mild intermittent asthma with (acute) exacerbation: Secondary | ICD-10-CM

## 2023-01-11 DIAGNOSIS — J069 Acute upper respiratory infection, unspecified: Secondary | ICD-10-CM

## 2023-01-11 MED ORDER — ALBUTEROL SULFATE HFA 108 (90 BASE) MCG/ACT IN AERS: 1.0000 | INHALATION_SPRAY | Freq: Four times a day (QID) | RESPIRATORY_TRACT | 2 refills | Status: AC | PRN

## 2023-01-11 MED ORDER — CHLORPHEN-PE-ACETAMINOPHEN 4-10-325 MG PO TABS: ORAL_TABLET | ORAL | 0 refills | Status: DC

## 2023-01-11 NOTE — Progress Notes (Signed)
Established Patient Office Visit   Subjective:  Patient ID: Alyssa Cox, female    DOB: Mar 03, 1961  Age: 62 y.o. MRN: [id number]  Chief Complaint  Patient presents with   Sinusitis    Sneezing, nasal congestion symptoms x 2 days.     Sinusitis   Encounter Diagnoses  Name Primary?   Viral upper respiratory tract infection Yes   Mild intermittent reactive airway disease with acute exacerbation    Presents with a 1 to 2-day history of fatigue malaise, nasal congestion postnasal drip and mild cough.  Has felt warm.  Describes arthralgias.  Family members with recent URI symptoms.  She continues with Flonase and Claritin-D for seasonal rhinitis.  She asked for refill of albuterol for reactive airway disease.  She has had no wheezing with this illness.   ROS   Current Outpatient Medications:    AMBULATORY NON FORMULARY MEDICATION, Medication Name: Diltiazem 2% with lidocaine 2% cream.  Apply a pea sized amount per rectum 3 times a day [employer], Disp: 30 g, Rfl: 0   Chlorphen-PE-Acetaminophen 4-10-325 MG TABS, May take 1 every 6 hours as needed., Disp: 30 tablet, Rfl: 0   cholecalciferol (VITAMIN D3) 25 MCG (1000 UNIT) tablet, Take 1,000 Units by mouth daily., Disp: , Rfl:    fluticasone (FLONASE) 50 MCG/ACT nasal spray, SPRAY 2 SPRAYS INTO EACH NOSTRIL EVERY DAY, Disp: 48 mL, Rfl: 2   hydrocortisone 2.5 % ointment, SMARTSIG:sparingly Topical Twice Daily, Disp: , Rfl:    latanoprost (XALATAN) 0.005 % ophthalmic solution, latanoprost 0.005 % eye drops  INSTILL 1 DROP INTO BOTH EYES AT BEDTIME, Disp: , Rfl:    meloxicam (MOBIC) 7.5 MG tablet, Take 1 tablet (7.5 mg total) by mouth daily as needed for pain., Disp: 30 tablet, Rfl: 2   Multiple Vitamins-Minerals (MULTIVITAMIN WOMEN 50+ PO), Take by mouth., Disp: , Rfl:    traMADol (ULTRAM) 50 MG tablet, Take 1-2 tablets (50-100 mg total) by mouth every 6 (six) hours as needed., Disp: 30 tablet, Rfl: 1   albuterol (VENTOLIN HFA)  108 (90 Base) MCG/ACT inhaler, Inhale 1-2 puffs into the lungs every 6 (six) hours as needed for wheezing or shortness of breath., Disp: 8 g, Rfl: 2   calcium carbonate (OS-CAL - DOSED IN MG OF ELEMENTAL CALCIUM) 1250 (500 Ca) MG tablet, Take 1 tablet by mouth. (Patient not taking: Reported on 01/11/2023), Disp: , Rfl:    Objective:     BP 132/76 (BP Location: Right Arm, Patient Position: Sitting, Cuff Size: Normal)   Pulse 71   Temp (!) 97.5 F (36.4 C) (Temporal)   Ht 5\' 5"  (1.651 m)   Wt 136 lb (61.7 kg)   SpO2 100%   BMI 22.63 kg/m    Physical Exam Constitutional:      General: She is not in acute distress.    Appearance: Normal appearance. She is not ill-appearing, toxic-appearing or diaphoretic.  HENT:     Head: Normocephalic and atraumatic.     Right Ear: Tympanic membrane, ear canal and external ear normal.     Left Ear: Tympanic membrane, ear canal and external ear normal.     Mouth/Throat:     Mouth: Mucous membranes are moist.     Pharynx: Oropharynx is clear. No oropharyngeal exudate or posterior oropharyngeal erythema.  Eyes:     General: No scleral icterus.       Right eye: No discharge.        Left eye: No discharge.  Extraocular Movements: Extraocular movements intact.     Conjunctiva/sclera: Conjunctivae normal.     Pupils: Pupils are equal, round, and reactive to light.  Cardiovascular:     Rate and Rhythm: Normal rate and regular rhythm.  Pulmonary:     Effort: Pulmonary effort is normal. No respiratory distress.     Breath sounds: Normal breath sounds. No wheezing, rhonchi or rales.  Musculoskeletal:     Cervical back: No rigidity or tenderness.  Skin:    General: Skin is warm and dry.  Neurological:     Mental Status: She is alert and oriented to person, place, and time.  Psychiatric:        Mood and Affect: Mood normal.        Behavior: Behavior normal.      No results found for any visits on 01/11/23.    The ASCVD Risk score (Arnett  DK, et al., 2019) failed to calculate for the following reasons:   The valid HDL cholesterol range is 20 to 100 mg/dL    Assessment & Plan:   Viral upper respiratory tract infection -     Chlorphen-PE-Acetaminophen; May take 1 every 6 hours as needed.  Dispense: 30 tablet; Refill: 0  Mild intermittent reactive airway disease with acute exacerbation -     Albuterol Sulfate HFA; Inhale 1-2 puffs into the lungs every 6 (six) hours as needed for wheezing or shortness of breath.  Dispense: 8 g; Refill: 2    Return in about 1 week (around 01/18/2023), or Hold Claritin D while taking Norel. Continue flonase..  Was concerned about her elevated cholesterol.  She has an HDL of 105 and an LDL of 133.  She quit smoking years ago.  She does not have hypertension or diabetes.  She is low risk for heart disease.  Explained that the elevated HDL offsets her elevated LDL.  I Return in 1 week if not improving.  Libby Maw, MD

## 2023-01-13 ENCOUNTER — Encounter: Payer: Self-pay | Admitting: Physical Therapy

## 2023-01-20 ENCOUNTER — Encounter: Payer: Self-pay | Admitting: Physical Therapy

## 2023-01-20 ENCOUNTER — Encounter: Payer: BC Managed Care – PPO | Attending: Gastroenterology | Admitting: Physical Therapy

## 2023-01-20 DIAGNOSIS — M5459 Other low back pain: Secondary | ICD-10-CM | POA: Diagnosis present

## 2023-01-20 DIAGNOSIS — R278 Other lack of coordination: Secondary | ICD-10-CM | POA: Diagnosis present

## 2023-01-20 DIAGNOSIS — M546 Pain in thoracic spine: Secondary | ICD-10-CM | POA: Diagnosis present

## 2023-01-20 DIAGNOSIS — R252 Cramp and spasm: Secondary | ICD-10-CM | POA: Diagnosis present

## 2023-01-20 NOTE — Therapy (Signed)
OUTPATIENT PHYSICAL THERAPY TREATMENT NOTE   Patient Name: Alyssa Cox MRN: [id number] DOB:1961-01-03, 62 y.o., female Today's Date: 01/20/2023  PCP:  Libby Maw, MD  REFERRING PROVIDER: Mauri Pole, MD   END OF SESSION:   PT End of Session - 01/20/23 1406     Visit Number 4    Date for PT Re-Evaluation 02/13/23    Authorization Type BCBS    PT Start Time 1400    PT Stop Time [id number]    PT Time Calculation (min) 45 min    Activity Tolerance Patient tolerated treatment well    Behavior During Therapy [facility] for tasks assessed/performed             Past Medical History:  Diagnosis Date   Allergic rhinitis    Anal fissure    Chronic idiopathic constipation    Colon polyps    Endometriosis    Pulled muscle 10/2022   Past Surgical History:  Procedure Laterality Date   ABDOMINAL HYSTERECTOMY     ANAL FISSURE REPAIR N/A 01/04/2023   Procedure: ANAL DILATION;  Surgeon: Leighton Ruff, MD;  Location: [place];  Service: General;  Laterality: N/A;   CESAREAN SECTION     HEMORRHOID SURGERY     SPHINCTEROTOMY N/A 01/04/2023   Procedure: CHEMICAL SPHINCTEROTOMY;  Surgeon: Leighton Ruff, MD;  Location: [place];  Service: General;  Laterality: N/A;   VASCULAR SURGERY     Patient Active Problem List   Diagnosis Date Noted   Lumbar radiculopathy 11/18/2022   Leukopenia 06/22/2022   Sciatica of right side 06/22/2022   Flu vaccine need 06/22/2022   Allergic rhinitis 12/17/2021   Other fatigue 12/17/2021   Allergic reaction 11/26/2021   Xerosis cutis 11/26/2021   Gynecologic exam normal 11/19/2021   History of total abdominal hysterectomy and bilateral salpingo-oophorectomy 11/19/2021   Vaginal atrophy 11/19/2021   Glaucoma 09/23/2021   Healthcare maintenance 09/23/2021   Plantar fasciitis 09/23/2021   Need for shingles vaccine 09/23/2021   REFERRING DIAG: K59.02 (ICD-10-CM) - Dyssynergic defecationM62.89 (ICD-10-CM) -  Pelvic floor dysfunction in female   THERAPY DIAG:  Other lack of coordination   Cramp and spasm   Other low back pain   Pain in thoracic spine   Rationale for Evaluation and Treatment: Rehabilitation   ONSET DATE: 12/2021   SUBJECTIVE:                                                                                                                                                                                            SUBJECTIVE STATEMENT: I am on the third dilator. I  have some rawness in the rectum. Psoriasis is coming out.      PAIN:  Are you having pain? Yes NPRS scale: 0/10 Pain location:  buttocks and low back   Pain type: pulling, cramping Pain description: constant    Aggravating factors: bowel movement, bending forward to pick up something, going sideways, lifting items, standing Relieving factors: sit with heating pad, stretch   PRECAUTIONS: None   WEIGHT BEARING RESTRICTIONS: No   FALLS:  Has patient fallen in last 6 months? No   LIVING ENVIRONMENT: Lives with: lives with their spouse     OCCUPATION: walking 3 miles   PLOF: Independent   PATIENT GOALS: poop normal   PERTINENT HISTORY:  Abdominal Hysterectomy; cesarean section; Hemorrhoid surgery; Anal fissure; Endometriosis   BOWEL MOVEMENT: Pain with bowel movement: Yes Type of bowel movement:Type (Bristol Stool Scale) Type 1 or 4, Frequency every other day to 3 days, Strain Yes, and Splinting none Fully empty rectum: No Leakage: No Fiber supplement: Yes: crushed flax seed, stool softener, Miralax, Doculax   URINATION: Pain with urination: No Fully empty bladder: No, sometimes has to go 2 times Stream: Strong Urgency: Yes: depending on how much water she drinks Frequency: 2-3 hours Leakage:  none   INTERCOURSE: no pain with intercourse   PREGNANCY: C-section deliveries 2     OBJECTIVE:    COGNITION: Overall cognitive status: Within functional limits for tasks assessed                           SENSATION: Light touch: Appears intact Proprioception: Appears intact     POSTURE: increased thoracic kyphosis   PELVIC ALIGNMENT:   LUMBARAROM/PROM:   A/PROM A/PROM  eval  Flexion Decreased by 25%  Extension full  Right lateral flexion Decreased by 25%  Left lateral flexion Decreased by 25% with pain  Right rotation Decreased by 25%  Left rotation Decreased by 25%   (Blank rows = not tested)   LOWER EXTREMITY ROM:      Passive ROM Right eval Left eval  Hip external rotation 40 45   (Blank rows = not tested)   LOWER EXTREMITY MMT:   MMT Right eval Left eval  Hip extension 3+/5 3+/5  Hip abduction 3+/5 4/5    PALPATION:   General  restrictions in the c-section scar, tightness in the lower abdomen and by the rib cage; able to move the lower rib cage with good movement ; tenderness in gluteus medius, quadratus, gluteus maximus                External Perineal Exam tenderness located on the levator ani, able to contract and bulge the anus                             Internal Pelvic Floor not assessed due to her seeing the doctor next week and do not want to tear any tissue   Patient confirms identification and approves PT to assess internal pelvic floor and treatment No   PELVIC MMT:   MMT 01/20/23  Internal Anal Sphincter 2/5   External Anal Sphincter 2/5   Puborectalis 2/5   (Blank rows = not tested)           TODAY'S TREATMENT:    01/20/23 Manual: Myofascial release: Fascial release externally around the anus especially by the external sphincter muscle Internal pelvic floor techniques: No emotional/communication barriers or cognitive limitation.  Patient is motivated to learn. Patient understands and agrees with treatment goals and plan. PT explains patient will be examined in standing, sitting, and lying down to see how their muscles and joints work. When they are ready, they will be asked to remove their underwear so PT can examine their  perineum. The patient is also given the option of providing their own chaperone as one is not provided in our facility. The patient also has the right and is explained the right to defer or refuse any part of the evaluation or treatment including the internal exam. With the patient's consent, PT will use one gloved finger to gently assess the muscles of the pelvic floor, seeing how well it contracts and relaxes and if there is muscle symmetry. After, the patient will get dressed and PT and patient will discuss exam findings and plan of care. PT and patient discuss plan of care, schedule, attendance policy and HEP activities.  Going through the rectum working on the sphincter muscles, puborectalis, coccygeus, iliococcygeus, Alcock's canal with one finger internally and other externally Neuromuscular re-education: Pelvic floor contraction training: Anal contraction with therapist finger in the rectum to give tactile cues to contract without contracting the gluteals and working on relaxation    01/05/23 Manual: Soft tissue mobilization: To address for dry needling Manual work to right quadratus, along the lumbar paraspinals, and right gluteal to elongate after dry needling Trigger Point Dry-Needling  Treatment instructions: Expect mild to moderate muscle soreness. S/S of pneumothorax if dry needled over a lung field, and to seek immediate medical attention should they occur. Patient verbalized understanding of these instructions and education. Patient Consent Given: Yes Education handout provided: Previously provided Muscles treated:  Electrical stimulation performed: No Parameters: N/A Treatment response/outcome: right quadratus, right lumbar multifid, right gluteus medius Neuromuscular re-education: Pelvic floor contraction training: Educated patient on how to use the anal dilator to expand the tissue after her Botox treatment. She performed diaphragmatic breathing first then inserted the dilator,  relaxed with legs on bolster, listen to pelvic floor meditation       PATIENT EDUCATION:  01/05/23 Education details: educated patient on using anal dilators and gave her slippery stuff samples to use, educated on using vaginal moisturizer daily for her dryness Person educated: Patient Education method: Explanation Education comprehension: verbalized understanding   HOME EXERCISE PROGRAM: See above.    ASSESSMENT:   CLINICAL IMPRESSION: Patient is a 62 y.o. female  who was seen today for physical therapy  treatment for pelvic floor dysfunction and dyssynergic defecation.   Pelvic floor strength is 2/5 with verbal cues to not contract the gluteals. She has trigger points in the right alcock's canal. She has tightness along the external anal sphincter. Patient is on the 4th dilator. Patient will benefit from skilled therapy to improve pelvic floor coordination with bowel movents and reduce her pain.    OBJECTIVE IMPAIRMENTS: decreased coordination, decreased strength, increased fascial restrictions, increased muscle spasms, and pain.    ACTIVITY LIMITATIONS: lifting, bending, and toileting   PARTICIPATION LIMITATIONS: community activity   PERSONAL FACTORS: Age, Time since onset of injury/illness/exacerbation, and 1-2 comorbidities: Abdominal Hysterectomy; cesarean section; Hemorrhoid surgery; Anal fissure; Endometriosis  are also affecting patient's functional outcome.    REHAB POTENTIAL: Excellent   CLINICAL DECISION MAKING: Evolving/moderate complexity   EVALUATION COMPLEXITY: Moderate     GOALS: Goals reviewed with patient? Yes   SHORT TERM GOALS: Target date: 12/22/22   Patient is independent with the use of the anal dilators  Baseline: Goal status: Met 01/05/23   2.  Patient allows therapist to assess the anal sphincter rectal strength and work on the anal sphincter.  Baseline:  Goal status:  Met 01/20/23   3.  Patient reports her back to right calf pain decreased >/= 25%  due to improved tissue mobiltiy.  Baseline:  Goal status: Ongoing 01/20/23     LONG TERM GOALS: Target date: 02/15/23   Patient independent with advanced HEP for core and pelvic floor to improve bowel movements.  Baseline:  Goal status: INITIAL   2.  Patient pain with bowel movements in her back to right calf decreased >/= 75% due to improved tissue mobility.  Baseline:  Goal status: Ongoing 01/20/23   3.  Patient is able to fully empty her rectum due to full relaxation of the rectum and improved tissue mobility of the abdomen.  Baseline: at this time able but the rectum will be checked next week.  Goal status: ongoing 01/20/23   4.  Patient reports she is straining </= 50% due to improved coordination of the pelvic floor muscles.  Baseline:  Goal status: ongoing 01/20/23       PLAN:   PT FREQUENCY: 1x/week   PT DURATION: 12 weeks   PLANNED INTERVENTIONS: Therapeutic exercises, Therapeutic activity, Neuromuscular re-education, Patient/Family education, Joint mobilization, Dry Needling, Electrical stimulation, Spinal mobilization, Cryotherapy, Moist heat, Splintting, Taping, Ultrasound, Biofeedback, and Manual therapy   PLAN FOR NEXT SESSION: dry needling to the right quadratus, along the gluteals,and piriformis, work on rectal relaxation, hip internal rotation stretches to stretch the pelvic floor, , joint mobilization to thoracic lumbar, abdominal work,   Earlie Counts, PT 01/20/23 3:03 PM

## 2023-01-24 ENCOUNTER — Encounter: Payer: Self-pay | Admitting: Physical Therapy

## 2023-01-24 ENCOUNTER — Ambulatory Visit: Payer: BC Managed Care – PPO | Attending: Family Medicine | Admitting: Physical Therapy

## 2023-01-24 DIAGNOSIS — M546 Pain in thoracic spine: Secondary | ICD-10-CM

## 2023-01-24 DIAGNOSIS — R252 Cramp and spasm: Secondary | ICD-10-CM

## 2023-01-24 DIAGNOSIS — R278 Other lack of coordination: Secondary | ICD-10-CM | POA: Diagnosis present

## 2023-01-24 DIAGNOSIS — M5459 Other low back pain: Secondary | ICD-10-CM | POA: Diagnosis present

## 2023-01-24 NOTE — Therapy (Signed)
OUTPATIENT PHYSICAL THERAPY THORACOLUMBAR TREATMENT   Patient Name: Alyssa Cox MRN: [id number] DOB:1961-07-24, 62 y.o., female Today's Date: 01/24/2023  END OF SESSION:  PT End of Session - 01/24/23 0848     Visit Number 5    Date for PT Re-Evaluation 02/13/23    Authorization Type BCBS    PT Start Time [phone]    PT Stop Time 0930    PT Time Calculation (min) 46 min    Activity Tolerance Patient tolerated treatment well    Behavior During Therapy [facility] for tasks assessed/performed             Past Medical History:  Diagnosis Date   Allergic rhinitis    Anal fissure    Chronic idiopathic constipation    Colon polyps    Endometriosis    Pulled muscle 10/2022   Past Surgical History:  Procedure Laterality Date   ABDOMINAL HYSTERECTOMY     ANAL FISSURE REPAIR N/A 01/04/2023   Procedure: ANAL DILATION;  Surgeon: Romie Levee, MD;  Location: [facility];  Service: General;  Laterality: N/A;   CESAREAN SECTION     HEMORRHOID SURGERY     SPHINCTEROTOMY N/A 01/04/2023   Procedure: CHEMICAL SPHINCTEROTOMY;  Surgeon: Romie Levee, MD;  Location: [facility] [place];  Service: General;  Laterality: N/A;   VASCULAR SURGERY     Patient Active Problem List   Diagnosis Date Noted   Lumbar radiculopathy 11/18/2022   Leukopenia 06/22/2022   Sciatica of right side 06/22/2022   Flu vaccine need 06/22/2022   Allergic rhinitis 12/17/2021   Other fatigue 12/17/2021   Allergic reaction 11/26/2021   Xerosis cutis 11/26/2021   Gynecologic exam normal 11/19/2021   History of total abdominal hysterectomy and bilateral salpingo-oophorectomy 11/19/2021   Vaginal atrophy 11/19/2021   Glaucoma 09/23/2021   Healthcare maintenance 09/23/2021   Plantar fasciitis 09/23/2021   Need for shingles vaccine 09/23/2021    PCP: Doreene Burke  REFERRING PROVIDER: Marcelino Scot DIAG: Low back pain  Rationale for Evaluation and Treatment: Rehabilitation  THERAPY DIAG:   Other low back pain  Cramp and spasm  Pain in thoracic spine  Other lack of coordination  ONSET DATE: 11/14/22  SUBJECTIVE:                                                                                                                                                                                           SUBJECTIVE STATEMENT: Patient reports that she is doing well overall, still hurting and has some questions about the HEP PERTINENT HISTORY:  See above  PAIN:  Are you having pain? Yes: NPRS scale:  3/10 Pain location: right low back and into the thoracic area sciatic issues right LE Pain description: sciatic, tightness Aggravating factors: bending, twisting, pain 9/10 Relieving factors: lie on floor, light stretching, at best pain a 1/10  PRECAUTIONS: None  WEIGHT BEARING RESTRICTIONS: No  FALLS:  Has patient fallen in last 6 months? Yes. Number of falls 1  LIVING ENVIRONMENT: Lives with: lives with their family Lives in: House/apartment Stairs: Yes: Internal: 16 steps; can reach both Has following equipment at home: None  OCCUPATION: retired does some book keeping sitting  PLOF: Independent  PATIENT GOALS: less pain and feel stronger  NEXT MD VISIT: March 7th  OBJECTIVE:   DIAGNOSTIC FINDINGS:   1. No acute abnormality of the thoracic or lumbar spine. 2. Multilevel degenerative changes of the lumbar spine, greatest at L5-S1. 3. Chronic mild compression deformities of T8, T9, T11, and L1 vertebral bodies.  PATIENT SURVEYS:  FOTO 43.6 COGNITION: Overall cognitive status: Within functional limits for tasks assessed     SENSATION: WFL  MUSCLE LENGTH: Hamstrings: Right 50 deg; Left 60 deg Very tight calves and right quad  POSTURE: rounded shoulders, forward head, and decreased lumbar lordosis  PALPATION: Tight mild tenderness in the right lumbar area, very tender in the right buttock  LUMBAR ROM: decreased 25% with mild tgihtness and  pain   LOWER EXTREMITY ROM:   tight HS, calf and piriformis on the right   LOWER EXTREMITY MMT:    MMT Right eval Left eval  Hip flexion 4- 4-  Hip extension    Hip abduction 4- 4-  Hip adduction    Hip internal rotation    Hip external rotation    Knee flexion 4 4  Knee extension 4 4  Ankle dorsiflexion    Ankle plantarflexion    Ankle inversion    Ankle eversion     (Blank rows = not tested)  LUMBAR SPECIAL TESTS:  Straight leg raise test: Positive and Thomas test: Positive  GAIT: WFL's just a slight limp on the right  TODAY'S TREATMENT:                                                                                                                              DATE:   01/24/23 Bike level 4 x 5 minutes Elliptical level 3 x 3 minutes cues for posture and form Green tband scapular stability exercises Red tband hip exercises Supine exercises K2C, trunk rotation, bridge and iso,metric abs Passive stretch and then patient active stretch HS and piriformis mm DN to the right rhomboid and right buttock STM to the above  01/10/23 Nustep level 5 x 6 minutes Straight arm pulls 5# Calf stretches Lats 20# Rows 20# 5# hip extenisona nd abduction bilateral 2x10 Leg press 30# 2x10 Back to walk W backs 3# Green tband horizontal abduction Feet on ball K2C, trunk rotation, small bridges and isometric abs Passive HS and piriformis stretch DN to the right gluteus medius,  piriformis STM to the above and into the low back and rhomboid area  01/03/23 Bike level 4 x 6 minutes Straight arm pulls 5# cues for posture 5# Calf stretches 20# Rows 20# lats 40# leg press 20# leg curls 2x10 Green tband AR press Feet on ball K2C, trunk rotation, small bridge, isometric abs Passive LE stretches STM with her left side lying to the right paraspinals  12/29/22 Reviewed HEP, some verbal and tactile cues needed Nustep level 5 x 5 minutes Reviewed posture on an elliptical that she has at  home Calf stretches Passive HS and piriformis stretches 5# straight arm pulls 15# rows 20# lats all with cues for posture and form Feet on ball K2C, trunk rotaiton, small bridge and isometric abs STM to the lumbar and thoracic area  PATIENT EDUCATION:  Education details: POC/HEP Person educated: Patient Education method: Explanation, Demonstration, Tactile cues, Verbal cues, and Handouts Education comprehension: verbalized understanding  HOME EXERCISE PROGRAM: Access Code: [email or web address] URL: [email or web address] Date: 12/15/2022 Prepared by: Stacie GlazeMichael   Exercises - Standing Row with Anchored Resistance  - 1 x daily - 7 x weekly - 3 sets - 10 reps - 3 hold - Shoulder extension with resistance - Neutral  - 1 x daily - 7 x weekly - 3 sets - 10 reps - 3 hold - Supine Quadriceps Stretch with Strap on Table  - 2 x daily - 7 x weekly - 1 sets - 5 reps - 20 hold  ASSESSMENT:  CLINICAL IMPRESSION: Patient reports that she does not want to go to a gym and does not have a ball, so we really focused on what she could do at home and how to do without ball and gym equipment, needs cues for posture and form, but mostly needs cues to go slow.  Tight and tender piriformis area. Tried to show her some ways to stretch the rhomboid area OBJECTIVE IMPAIRMENTS: cardiopulmonary status limiting activity, decreased activity tolerance, decreased coordination, decreased endurance, decreased mobility, difficulty walking, decreased ROM, decreased strength, increased muscle spasms, impaired flexibility, improper body mechanics, postural dysfunction, and pain.   REHAB POTENTIAL: Good  CLINICAL DECISION MAKING: Stable/uncomplicated  EVALUATION COMPLEXITY: Low   GOALS: Goals reviewed with patient? Yes  SHORT TERM GOALS: Target date: 12/26/22  Independent with initial HEP Goal status: met  LONG TERM GOALS: Target date: 03/15/23  Understand posture and body mechanics Goal status:met  01/24/23  2.  Increase lumbar ROM to WNL's Goal status: progressing  3.  Increase flexibility of the right quad to when she is in prone passively we can get her heel to her buttock Goal status: INITIAL  4.  Report pain decreased 50% Goal status:ongoing 01/03/23  5.  Independent and safe with gym program Goal status: progressing  PLAN:  PT FREQUENCY: 1-2x/week  PT DURATION: 12 weeks  PLANNED INTERVENTIONS: Therapeutic exercises, Therapeutic activity, Neuromuscular re-education, Balance training, Gait training, Patient/Family education, Self Care, Joint mobilization, Dry Needling, Electrical stimulation, Spinal mobilization, Taping, Traction, and Manual therapy.  PLAN FOR NEXT SESSION: continue to progress strength and HEP   , W, PT 01/24/2023, 8:52 AM

## 2023-01-27 ENCOUNTER — Encounter: Payer: BC Managed Care – PPO | Admitting: Physical Therapy

## 2023-01-27 ENCOUNTER — Encounter: Payer: Self-pay | Admitting: Physical Therapy

## 2023-01-27 DIAGNOSIS — M5459 Other low back pain: Secondary | ICD-10-CM

## 2023-01-27 DIAGNOSIS — R278 Other lack of coordination: Secondary | ICD-10-CM

## 2023-01-27 DIAGNOSIS — M546 Pain in thoracic spine: Secondary | ICD-10-CM

## 2023-01-27 DIAGNOSIS — R252 Cramp and spasm: Secondary | ICD-10-CM

## 2023-01-27 NOTE — Therapy (Signed)
OUTPATIENT PHYSICAL THERAPY TREATMENT NOTE   Patient Name: Alyssa Cox MRN: [id number] DOB:[birth year], 62 y.o., female Today's Date: 01/27/2023  PCP: Mliss SaxKremer, William Alfred, MD  REFERRING PROVIDER: Napoleon FormNandigam, Kavitha V, MD   END OF SESSION:   PT End of Session - 01/27/23 1407     Visit Number 6    Date for PT Re-Evaluation 02/13/23    Authorization Type BCBS    PT Start Time 1400    PT Stop Time 1445    PT Time Calculation (min) 45 min    Activity Tolerance Patient tolerated treatment well    [facility] for tasks assessed/performed             Past Medical History:  Diagnosis Date   Allergic rhinitis    Anal fissure    Chronic idiopathic constipation    Colon polyps    Endometriosis    Pulled muscle 10/2022   Past Surgical History:  Procedure Laterality Date   ABDOMINAL HYSTERECTOMY     ANAL FISSURE REPAIR N/A 01/04/2023   Procedure: ANAL DILATION;  Surgeon: Romie Leveehomas, Alicia, MD;  [facility] [place];  Service: General;  Laterality: N/A;   CESAREAN SECTION     HEMORRHOID SURGERY     SPHINCTEROTOMY N/A 01/04/2023   Procedure: CHEMICAL SPHINCTEROTOMY;  Surgeon: Romie Leveehomas, Alicia, MD;  [facility] [place];  Service: General;  Laterality: N/A;   VASCULAR SURGERY     Patient Active Problem List   Diagnosis Date Noted   Lumbar radiculopathy 11/18/2022   Leukopenia 06/22/2022   Sciatica of right side 06/22/2022   Flu vaccine need 06/22/2022   Allergic rhinitis 12/17/2021   Other fatigue 12/17/2021   Allergic reaction 11/26/2021   Xerosis cutis 11/26/2021   Gynecologic exam normal 11/19/2021   History of total abdominal hysterectomy and bilateral salpingo-oophorectomy 11/19/2021   Vaginal atrophy 11/19/2021   Glaucoma 09/23/2021   Healthcare maintenance 09/23/2021   Plantar fasciitis 09/23/2021   Need for shingles vaccine 09/23/2021   REFERRING DIAG: K59.02 (ICD-10-CM) - Dyssynergic defecationM62.89 (ICD-10-CM) -  Pelvic floor dysfunction in female   THERAPY DIAG:  Other lack of coordination   Cramp and spasm   Other low back pain   Pain in thoracic spine   Rationale for Evaluation and Treatment: Rehabilitation   ONSET DATE: 12/2021   SUBJECTIVE:                                                                                                                                                                                            SUBJECTIVE STATEMENT: I am on the third dilator. I saw  her on Tuesday and she said I do not need the dilator. MD is happy with the outcomes. My stool is a little runny. 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 I still have some rawness.       PAIN:  Are you having pain? Yes NPRS scale: 0/10 Pain location:  buttocks and low back   Pain type: pulling, cramping Pain description: constant    Aggravating factors: bowel movement, bending forward to pick up something, going sideways, lifting items, standing Relieving factors: sit with heating pad, stretch   PRECAUTIONS: None   WEIGHT BEARING RESTRICTIONS: No   FALLS:  Has patient fallen in last 6 months? No   LIVING ENVIRONMENT: Lives with: lives with their spouse     OCCUPATION: walking 3 miles   PLOF: Independent   PATIENT GOALS: poop normal   PERTINENT HISTORY:  Abdominal Hysterectomy; cesarean section; Hemorrhoid surgery; Anal fissure; Endometriosis   BOWEL MOVEMENT: Pain with bowel movement: Yes Type of bowel movement:Type (Bristol Stool Scale) Type 1 or 4, Frequency every other day to 3 days, Strain Yes, and Splinting none Fully empty rectum: No Leakage: No Fiber supplement: Yes: crushed flax seed, stool softener, Miralax, Doculax   URINATION: Pain with urination: No Fully empty bladder: No, sometimes has to go 2 times Stream: Strong Urgency: Yes: depending on how much water she drinks Frequency: 2-3 hours Leakage:  none   INTERCOURSE: no pain with intercourse    PREGNANCY: C-section deliveries 2     OBJECTIVE:    COGNITION: Overall cognitive status: Within functional limits for tasks assessed                          SENSATION: Light touch: Appears intact Proprioception: Appears intact     POSTURE: increased thoracic kyphosis   PELVIC ALIGNMENT:   LUMBARAROM/PROM:   A/PROM A/PROM  eval  Flexion Decreased by 25%  Extension full  Right lateral flexion Decreased by 25%  Left lateral flexion Decreased by 25% with pain  Right rotation Decreased by 25%  Left rotation Decreased by 25%   (Blank rows = not tested)   LOWER EXTREMITY ROM:      Passive ROM Right eval Left eval  Hip external rotation 40 45   (Blank rows = not tested)   LOWER EXTREMITY MMT:   MMT Right eval Left eval  Hip extension 3+/5 3+/5  Hip abduction 3+/5 4/5    PALPATION:   General  restrictions in the c-section scar, tightness in the lower abdomen and by the rib cage; able to move the lower rib cage with good movement ; tenderness in gluteus medius, quadratus, gluteus maximus                External Perineal Exam tenderness located on the levator ani, able to contract and bulge the anus                             Internal Pelvic Floor not assessed due to her seeing the doctor next week and do not want to tear any tissue   Patient confirms identification and approves PT to assess internal pelvic floor and treatment No   PELVIC MMT:   MMT 01/20/23 01/27/23  Internal Anal Sphincter 2/5  3/5  External Anal Sphincter 2/5  3/5  Puborectalis 2/5  3/5  (Blank rows = not tested)  TODAY'S TREATMENT:    01/27/23 Manual: Internal pelvic floor techniques: No emotional/communication barriers or cognitive limitation. Patient is motivated to learn. Patient understands and agrees with treatment goals and plan. PT explains patient will be examined in standing, sitting, and lying down to see how their muscles and joints work. When they are ready, they  will be asked to remove their underwear so PT can examine their perineum. The patient is also given the option of providing their own chaperone as one is not provided in our facility. The patient also has the right and is explained the right to defer or refuse any part of the evaluation or treatment including the internal exam. With the patient's consent, PT will use one gloved finger to gently assess the muscles of the pelvic floor, seeing how well it contracts and relaxes and if there is muscle symmetry. After, the patient will get dressed and PT and patient will discuss exam findings and plan of care. PT and patient discuss plan of care, schedule, attendance policy and HEP activities.  Going through the rectum working gon the puborectalis, anterior rectum, iliococcygeus, anococcygeal ligament, aycocks canal  01/20/23 Manual: Myofascial release: Fascial release externally around the anus especially by the external sphincter muscle Internal pelvic floor techniques: No emotional/communication barriers or cognitive limitation. Patient is motivated to learn. Patient understands and agrees with treatment goals and plan. PT explains patient will be examined in standing, sitting, and lying down to see how their muscles and joints work. When they are ready, they will be asked to remove their underwear so PT can examine their perineum. The patient is also given the option of providing their own chaperone as one is not provided in our facility. The patient also has the right and is explained the right to defer or refuse any part of the evaluation or treatment including the internal exam. With the patient's consent, PT will use one gloved finger to gently assess the muscles of the pelvic floor, seeing how well it contracts and relaxes and if there is muscle symmetry. After, the patient will get dressed and PT and patient will discuss exam findings and plan of care. PT and patient discuss plan of care, schedule,  attendance policy and HEP activities.  Going through the rectum working on the sphincter muscles, puborectalis, coccygeus, iliococcygeus, Alcock's canal with one finger internally and other externally Neuromuscular re-education: Pelvic floor contraction training: Anal contraction with therapist finger in the rectum to give tactile cues to contract without contracting the gluteals and working on relaxation    01/05/23 Manual: Soft tissue mobilization: To address for dry needling Manual work to right quadratus, along the lumbar paraspinals, and right gluteal to elongate after dry needling Trigger Point Dry-Needling  Treatment instructions: Expect mild to moderate muscle soreness. S/S of pneumothorax if dry needled over a lung field, and to seek immediate medical attention should they occur. Patient verbalized understanding of these instructions and education. Patient Consent Given: Yes Education handout provided: Previously provided Muscles treated:  Electrical stimulation performed: No Parameters: N/A Treatment response/outcome: right quadratus, right lumbar multifid, right gluteus medius Neuromuscular re-education: Pelvic floor contraction training: Educated patient on how to use the anal dilator to expand the tissue after her Botox treatment. She performed diaphragmatic breathing first then inserted the dilator, relaxed with legs on bolster, listen to pelvic floor meditation       PATIENT EDUCATION:  01/05/23 Education details: educated patient on using anal dilators and gave her slippery stuff samples to use, educated  on using vaginal moisturizer daily for her dryness Person educated: Patient Education method: Explanation Education comprehension: verbalized understanding   HOME EXERCISE PROGRAM: See above.    ASSESSMENT:   CLINICAL IMPRESSION: Patient is a 62 y.o. female  who was seen today for physical therapy  treatment for pelvic floor dysfunction and dyssynergic defecation.    Pelvic floor strength is 3/5 with a good lift and no gluteal contraction. She had trigger points by the Alcocks canal that would refer pain down into the left leg. She is able to have a bowel movement without pain.  Patient will benefit from skilled therapy to improve pelvic floor coordination with bowel movents and reduce her pain.    OBJECTIVE IMPAIRMENTS: decreased coordination, decreased strength, increased fascial restrictions, increased muscle spasms, and pain.    ACTIVITY LIMITATIONS: lifting, bending, and toileting   PARTICIPATION LIMITATIONS: community activity   PERSONAL FACTORS: Age, Time since onset of injury/illness/exacerbation, and 1-2 comorbidities: Abdominal Hysterectomy; cesarean section; Hemorrhoid surgery; Anal fissure; Endometriosis  are also affecting patient's functional outcome.    REHAB POTENTIAL: Excellent   CLINICAL DECISION MAKING: Evolving/moderate complexity   EVALUATION COMPLEXITY: Moderate     GOALS: Goals reviewed with patient? Yes   SHORT TERM GOALS: Target date: 12/22/22   Patient is independent with the use of the anal dilators Baseline: Goal status: Met 01/05/23   2.  Patient allows therapist to assess the anal sphincter rectal strength and work on the anal sphincter.  Baseline:  Goal status:  Met 01/20/23   3.  Patient reports her back to right calf pain decreased >/= 25% due to improved tissue mobiltiy.  Baseline:  Goal status: Met 01/27/23     LONG TERM GOALS: Target date: 02/15/23   Patient independent with advanced HEP for core and pelvic floor to improve bowel movements.  Baseline:  Goal status: Met 01/27/23   2.  Patient pain with bowel movements in her back to right calf decreased >/= 75% due to improved tissue mobility.  Baseline: sometimes Goal status: Ongoing 01/20/23   3.  Patient is able to fully empty her rectum due to full relaxation of the rectum and improved tissue mobility of the abdomen.  Baseline: at this time able but the  rectum will be checked next week.  Goal status: ongoing 01/20/23   4.  Patient reports she is straining </= 50% due to improved coordination of the pelvic floor muscles.  Baseline: no straining since procedure Goal status: ongoing 01/20/23       PLAN:   PT FREQUENCY: 1x/week   PT DURATION: 12 weeks   PLANNED INTERVENTIONS: Therapeutic exercises, Therapeutic activity, Neuromuscular re-education, Patient/Family education, Joint mobilization, Dry Needling, Electrical stimulation, Spinal mobilization, Cryotherapy, Moist heat, Splintting, Taping, Ultrasound, Biofeedback, and Manual therapy   PLAN FOR NEXT SESSION: manual work to rectum, if she is doing well then discharge  Eulis Foster, PT 01/27/23 3:07 PM

## 2023-01-31 ENCOUNTER — Encounter: Payer: Self-pay | Admitting: *Deleted

## 2023-01-31 ENCOUNTER — Encounter: Payer: Self-pay | Admitting: Physical Therapy

## 2023-01-31 ENCOUNTER — Ambulatory Visit: Payer: BC Managed Care – PPO | Admitting: Physical Therapy

## 2023-01-31 DIAGNOSIS — M546 Pain in thoracic spine: Secondary | ICD-10-CM

## 2023-01-31 DIAGNOSIS — M5459 Other low back pain: Secondary | ICD-10-CM | POA: Diagnosis not present

## 2023-01-31 DIAGNOSIS — R252 Cramp and spasm: Secondary | ICD-10-CM

## 2023-01-31 NOTE — Therapy (Signed)
OUTPATIENT PHYSICAL THERAPY THORACOLUMBAR TREATMENT   Patient Name: Alyssa Cox MRN: [id number] DOB:Feb 04, 1961, 62 y.o., female Today's Date: 01/31/2023  END OF SESSION:  PT End of Session - 01/31/23 0840     Visit Number 7    Date for PT Re-Evaluation 02/13/23    Authorization Type BCBS    PT Start Time [phone]    Activity Tolerance Patient tolerated treatment well    Behavior During Therapy [facility] for tasks assessed/performed             Past Medical History:  Diagnosis Date   Allergic rhinitis    Anal fissure    Chronic idiopathic constipation    Colon polyps    Endometriosis    Pulled muscle 10/2022   Past Surgical History:  Procedure Laterality Date   ABDOMINAL HYSTERECTOMY     ANAL FISSURE REPAIR N/A 01/04/2023   Procedure: ANAL DILATION;  Surgeon: Romie Levee, MD;  Location: [facility];  Service: General;  Laterality: N/A;   CESAREAN SECTION     HEMORRHOID SURGERY     SPHINCTEROTOMY N/A 01/04/2023   Procedure: CHEMICAL SPHINCTEROTOMY;  Surgeon: Romie Levee, MD;  Location: [facility];  Service: General;  Laterality: N/A;   VASCULAR SURGERY     Patient Active Problem List   Diagnosis Date Noted   Lumbar radiculopathy 11/18/2022   Leukopenia 06/22/2022   Sciatica of right side 06/22/2022   Flu vaccine need 06/22/2022   Allergic rhinitis 12/17/2021   Other fatigue 12/17/2021   Allergic reaction 11/26/2021   Xerosis cutis 11/26/2021   Gynecologic exam normal 11/19/2021   History of total abdominal hysterectomy and bilateral salpingo-oophorectomy 11/19/2021   Vaginal atrophy 11/19/2021   Glaucoma 09/23/2021   Healthcare maintenance 09/23/2021   Plantar fasciitis 09/23/2021   Need for shingles vaccine 09/23/2021    PCP: Doreene Burke  REFERRING PROVIDER: Marcelino Scot DIAG: Low back pain  Rationale for Evaluation and Treatment: Rehabilitation  THERAPY DIAG:  Other low back pain  Cramp and spasm  Pain in thoracic  spine  ONSET DATE: 11/14/22  SUBJECTIVE:                                                                                                                                                                                           SUBJECTIVE STATEMENT: Patient reports that she was busy all week and did not do any exercises or stretches, a little sore and stiff today PERTINENT HISTORY:  See above  PAIN:  Are you having pain? Yes: NPRS scale: 3/10 Pain location: right low back and into the thoracic area sciatic issues right LE Pain  description: sciatic, tightness Aggravating factors: bending, twisting, pain 9/10 Relieving factors: lie on floor, light stretching, at best pain a 1/10  PRECAUTIONS: None  WEIGHT BEARING RESTRICTIONS: No  FALLS:  Has patient fallen in last 6 months? Yes. Number of falls 1  LIVING ENVIRONMENT: Lives with: lives with their family Lives in: House/apartment Stairs: Yes: Internal: 16 steps; can reach both Has following equipment at home: None  OCCUPATION: retired does some book keeping sitting  PLOF: Independent  PATIENT GOALS: less pain and feel stronger  NEXT MD VISIT: March 7th  OBJECTIVE:   DIAGNOSTIC FINDINGS:   1. No acute abnormality of the thoracic or lumbar spine. 2. Multilevel degenerative changes of the lumbar spine, greatest at L5-S1. 3. Chronic mild compression deformities of T8, T9, T11, and L1 vertebral bodies.  PATIENT SURVEYS:  FOTO 43.6 COGNITION: Overall cognitive status: Within functional limits for tasks assessed     SENSATION: WFL  MUSCLE LENGTH: Hamstrings: Right 50 deg; Left 60 deg Very tight calves and right quad  POSTURE: rounded shoulders, forward head, and decreased lumbar lordosis  PALPATION: Tight mild tenderness in the right lumbar area, very tender in the right buttock  LUMBAR ROM: decreased 25% with mild tgihtness and pain   LOWER EXTREMITY ROM:   tight HS, calf and piriformis on the  right   LOWER EXTREMITY MMT:    MMT Right eval Left eval  Hip flexion 4- 4-  Hip extension    Hip abduction 4- 4-  Hip adduction    Hip internal rotation    Hip external rotation    Knee flexion 4 4  Knee extension 4 4  Ankle dorsiflexion    Ankle plantarflexion    Ankle inversion    Ankle eversion     (Blank rows = not tested)  LUMBAR SPECIAL TESTS:  Straight leg raise test: Positive and Thomas test: Positive  GAIT: WFL's just a slight limp on the right  TODAY'S TREATMENT:                                                                                                                              DATE:   01/31/23 DN to the right thoracic paraspinals and the right buttock STM to the above  PROM of the LE's PA and rotational glides of the T spine  01/24/23 Bike level 4 x 5 minutes Elliptical level 3 x 3 minutes cues for posture and form Green tband scapular stability exercises Red tband hip exercises Supine exercises K2C, trunk rotation, bridge and iso,metric abs Passive stretch and then patient active stretch HS and piriformis mm DN to the right rhomboid and right buttock STM to the above  01/10/23 Nustep level 5 x 6 minutes Straight arm pulls 5# Calf stretches Lats 20# Rows 20# 5# hip extenisona nd abduction bilateral 2x10 Leg press 30# 2x10 Back to walk W backs 3# Green tband horizontal abduction Feet on ball K2C, trunk rotation, small bridges and  isometric abs Passive HS and piriformis stretch DN to the right gluteus medius, piriformis STM to the above and into the low back and rhomboid area  01/03/23 Bike level 4 x 6 minutes Straight arm pulls 5# cues for posture 5# Calf stretches 20# Rows 20# lats 40# leg press 20# leg curls 2x10 Green tband AR press Feet on ball K2C, trunk rotation, small bridge, isometric abs Passive LE stretches STM with her left side lying to the right paraspinals  12/29/22 Reviewed HEP, some verbal and tactile cues  needed Nustep level 5 x 5 minutes Reviewed posture on an elliptical that she has at home Calf stretches Passive HS and piriformis stretches 5# straight arm pulls 15# rows 20# lats all with cues for posture and form Feet on ball K2C, trunk rotaiton, small bridge and isometric abs STM to the lumbar and thoracic area  PATIENT EDUCATION:  Education details: POC/HEP Person educated: Patient Education method: Explanation, Demonstration, Tactile cues, Verbal cues, and Handouts Education comprehension: verbalized understanding  HOME EXERCISE PROGRAM: Access Code: [email or web address] URL: [email or web address] Date: 12/15/2022 Prepared by: Stacie Glaze  Exercises - Standing Row with Anchored Resistance  - 1 x daily - 7 x weekly - 3 sets - 10 reps - 3 hold - Shoulder extension with resistance - Neutral  - 1 x daily - 7 x weekly - 3 sets - 10 reps - 3 hold - Supine Quadriceps Stretch with Strap on Table  - 2 x daily - 7 x weekly - 1 sets - 5 reps - 20 hold  ASSESSMENT:  CLINICAL IMPRESSION: Patient reports that she has been doing well, did not do much activity or exercise this past week due to being busy, She is tight, tender and sore in the right paraspinals and in the right buttock.  She is working on doing the stretches at home OBJECTIVE IMPAIRMENTS: cardiopulmonary status limiting activity, decreased activity tolerance, decreased coordination, decreased endurance, decreased mobility, difficulty walking, decreased ROM, decreased strength, increased muscle spasms, impaired flexibility, improper body mechanics, postural dysfunction, and pain.   REHAB POTENTIAL: Good  CLINICAL DECISION MAKING: Stable/uncomplicated  EVALUATION COMPLEXITY: Low   GOALS: Goals reviewed with patient? Yes  SHORT TERM GOALS: Target date: 12/26/22  Independent with initial HEP Goal status: met  LONG TERM GOALS: Target date: 03/15/23  Understand posture and body mechanics Goal status:met  01/24/23  2.  Increase lumbar ROM to WNL's Goal status: met  3.  Increase flexibility of the right quad to when she is in prone passively we can get her heel to her buttock Goal status: INITIAL  4.  Report pain decreased 50% Goal status:ongoing 01/03/23  5.  Independent and safe with gym program Goal status: progressing  PLAN:  PT FREQUENCY: 1-2x/week  PT DURATION: 12 weeks  PLANNED INTERVENTIONS: Therapeutic exercises, Therapeutic activity, Neuromuscular re-education, Balance training, Gait training, Patient/Family education, Self Care, Joint mobilization, Dry Needling, Electrical stimulation, Spinal mobilization, Taping, Traction, and Manual therapy.  PLAN FOR NEXT SESSION: continue to progress strength and HEP   , W, PT 01/31/2023, 8:41 AM

## 2023-02-07 ENCOUNTER — Ambulatory Visit: Payer: BC Managed Care – PPO | Attending: Gastroenterology | Admitting: Physical Therapy

## 2023-02-07 ENCOUNTER — Encounter: Payer: Self-pay | Admitting: Physical Therapy

## 2023-02-07 DIAGNOSIS — R252 Cramp and spasm: Secondary | ICD-10-CM | POA: Diagnosis present

## 2023-02-07 DIAGNOSIS — R278 Other lack of coordination: Secondary | ICD-10-CM | POA: Diagnosis present

## 2023-02-07 DIAGNOSIS — M546 Pain in thoracic spine: Secondary | ICD-10-CM

## 2023-02-07 DIAGNOSIS — M5459 Other low back pain: Secondary | ICD-10-CM | POA: Diagnosis present

## 2023-02-07 NOTE — Therapy (Signed)
OUTPATIENT PHYSICAL THERAPY TREATMENT NOTE   Patient Name: Alyssa Cox MRN: [id number] DOB:08-10-1961, 62 y.o., female Today's Date: 02/07/2023  PCP: Mliss Sax, MD  REFERRING PROVIDER: Napoleon Form, MD   END OF SESSION:   PT End of Session - 02/07/23 0929     Visit Number 8    Date for PT Re-Evaluation 02/13/23    Authorization Type BCBS    PT Start Time 0930    PT Stop Time 0955    PT Time Calculation (min) 25 min    Activity Tolerance Patient tolerated treatment well    Behavior During Therapy [facility] for tasks assessed/performed             Past Medical History:  Diagnosis Date   Allergic rhinitis    Anal fissure    Chronic idiopathic constipation    Colon polyps    Endometriosis    Pulled muscle 10/2022   Past Surgical History:  Procedure Laterality Date   ABDOMINAL HYSTERECTOMY     ANAL FISSURE REPAIR N/A 01/04/2023   Procedure: ANAL DILATION;  Surgeon: Romie Levee, MD;  Location: [facility];  Service: General;  Laterality: N/A;   CESAREAN SECTION     HEMORRHOID SURGERY     SPHINCTEROTOMY N/A 01/04/2023   Procedure: CHEMICAL SPHINCTEROTOMY;  Surgeon: Romie Levee, MD;  Location: [facility];  Service: General;  Laterality: N/A;   VASCULAR SURGERY     Patient Active Problem List   Diagnosis Date Noted   Lumbar radiculopathy 11/18/2022   Leukopenia 06/22/2022   Sciatica of right side 06/22/2022   Flu vaccine need 06/22/2022   Allergic rhinitis 12/17/2021   Other fatigue 12/17/2021   Allergic reaction 11/26/2021   Xerosis cutis 11/26/2021   Gynecologic exam normal 11/19/2021   History of total abdominal hysterectomy and bilateral salpingo-oophorectomy 11/19/2021   Vaginal atrophy 11/19/2021   Glaucoma 09/23/2021   Healthcare maintenance 09/23/2021   Plantar fasciitis 09/23/2021   Need for shingles vaccine 09/23/2021   REFERRING DIAG: K59.02 (ICD-10-CM) - Dyssynergic defecationM62.89 (ICD-10-CM) -  Pelvic floor dysfunction in female   THERAPY DIAG:  Other lack of coordination   Cramp and spasm   Other low back pain   Pain in thoracic spine   Rationale for Evaluation and Treatment: Rehabilitation   ONSET DATE: 12/2021   SUBJECTIVE:                                                                                                                                                                                            SUBJECTIVE STATEMENT: I am doing well.  PAIN:  Are you having pain? Yes NPRS scale: 0/10 Pain location:  buttocks and low back   Pain type: pulling, cramping Pain description: constant    Aggravating factors: bowel movement, bending forward to pick up something, going sideways, lifting items, standing Relieving factors: sit with heating pad, stretch   PRECAUTIONS: None   WEIGHT BEARING RESTRICTIONS: No   FALLS:  Has patient fallen in last 6 months? No   LIVING ENVIRONMENT: Lives with: lives with their spouse     OCCUPATION: walking 3 miles   PLOF: Independent   PATIENT GOALS: poop normal   PERTINENT HISTORY:  Abdominal Hysterectomy; cesarean section; Hemorrhoid surgery; Anal fissure; Endometriosis   BOWEL MOVEMENT: Pain with bowel movement: Yes Type of bowel movement:Type (Bristol Stool Scale) Type 1 or 4, Frequency every other day to 3 days, Strain Yes, and Splinting none Fully empty rectum: No Leakage: No Fiber supplement: Yes: crushed flax seed, stool softener, Miralax, Doculax   URINATION: Pain with urination: No Fully empty bladder: No, sometimes has to go 2 times Stream: Strong Urgency: Yes: depending on how much water she drinks Frequency: 2-3 hours Leakage:  none   INTERCOURSE: no pain with intercourse   PREGNANCY: C-section deliveries 2     OBJECTIVE:    COGNITION: Overall cognitive status: Within functional limits for tasks assessed                          SENSATION: Light touch: Appears  intact Proprioception: Appears intact     POSTURE: increased thoracic kyphosis   PELVIC ALIGNMENT:   LUMBARAROM/PROM:   A/PROM A/PROM  eval  Flexion Decreased by 25%  Extension full  Right lateral flexion Decreased by 25%  Left lateral flexion Decreased by 25% with pain  Right rotation Decreased by 25%  Left rotation Decreased by 25%   (Blank rows = not tested)   LOWER EXTREMITY ROM:      Passive ROM Right eval Left eval  Hip external rotation 40 45   (Blank rows = not tested)   LOWER EXTREMITY MMT:   MMT Right eval Left eval  Hip extension 3+/5 3+/5  Hip abduction 3+/5 4/5    PALPATION:   General  restrictions in the c-section scar, tightness in the lower abdomen and by the rib cage; able to move the lower rib cage with good movement ; tenderness in gluteus medius, quadratus, gluteus maximus                External Perineal Exam tenderness located on the levator ani, able to contract and bulge the anus                             Internal Pelvic Floor not assessed due to her seeing the doctor next week and do not want to tear any tissue   Patient confirms identification and approves PT to assess internal pelvic floor and treatment No   PELVIC MMT:   MMT 01/20/23 01/27/23 02/07/23  Internal Anal Sphincter 2/5  3/5 3/5  External Anal Sphincter 2/5  3/5 3/5  Puborectalis 2/5  3/5 3/5  (Blank rows = not tested)           TODAY'S TREATMENT:   02/07/23 Manual: Internal pelvic floor techniques: No emotional/communication barriers or cognitive limitation. Patient is motivated to learn. Patient understands and agrees with treatment goals and plan. PT explains patient will be examined in standing,  sitting, and lying down to see how their muscles and joints work. When they are ready, they will be asked to remove their underwear so PT can examine their perineum. The patient is also given the option of providing their own chaperone as one is not provided in our facility.  The patient also has the right and is explained the right to defer or refuse any part of the evaluation or treatment including the internal exam. With the patient's consent, PT will use one gloved finger to gently assess the muscles of the pelvic floor, seeing how well it contracts and relaxes and if there is muscle symmetry. After, the patient will get dressed and PT and patient will discuss exam findings and plan of care. PT and patient discuss plan of care, schedule, attendance policy and HEP activities.  Going through the rectum working on the sphincter, along the puborectalis, along the anterior wall of the rectum.  Exercises: Stretches/mobility: Discussed with patient on continuing with her use of the rectal dilator for several weeks then use one time per month to keep the tissue elongated.  Strengthening: Discussed with patient on continuing with pelvic floor contractions to keep up her strength     01/27/23 Manual: Internal pelvic floor techniques: No emotional/communication barriers or cognitive limitation. Patient is motivated to learn. Patient understands and agrees with treatment goals and plan. PT explains patient will be examined in standing, sitting, and lying down to see how their muscles and joints work. When they are ready, they will be asked to remove their underwear so PT can examine their perineum. The patient is also given the option of providing their own chaperone as one is not provided in our facility. The patient also has the right and is explained the right to defer or refuse any part of the evaluation or treatment including the internal exam. With the patient's consent, PT will use one gloved finger to gently assess the muscles of the pelvic floor, seeing how well it contracts and relaxes and if there is muscle symmetry. After, the patient will get dressed and PT and patient will discuss exam findings and plan of care. PT and patient discuss plan of care, schedule, attendance  policy and HEP activities.  Going through the rectum working gon the puborectalis, anterior rectum, iliococcygeus, anococcygeal ligament, aycocks canal   01/20/23 Manual: Myofascial release: Fascial release externally around the anus especially by the external sphincter muscle Internal pelvic floor techniques: No emotional/communication barriers or cognitive limitation. Patient is motivated to learn. Patient understands and agrees with treatment goals and plan. PT explains patient will be examined in standing, sitting, and lying down to see how their muscles and joints work. When they are ready, they will be asked to remove their underwear so PT can examine their perineum. The patient is also given the option of providing their own chaperone as one is not provided in our facility. The patient also has the right and is explained the right to defer or refuse any part of the evaluation or treatment including the internal exam. With the patient's consent, PT will use one gloved finger to gently assess the muscles of the pelvic floor, seeing how well it contracts and relaxes and if there is muscle symmetry. After, the patient will get dressed and PT and patient will discuss exam findings and plan of care. PT and patient discuss plan of care, schedule, attendance policy and HEP activities.  Going through the rectum working on the sphincter muscles, puborectalis, coccygeus, iliococcygeus,  Alcock's canal with one finger internally and other externally Neuromuscular re-education: Pelvic floor contraction training: Anal contraction with therapist finger in the rectum to give tactile cues to contract without contracting the gluteals and working on relaxation        PATIENT EDUCATION:  01/05/23 Education details: educated patient on using anal dilators and gave her slippery stuff samples to use, educated on using vaginal moisturizer daily for her dryness Person educated: Patient Education method:  Explanation Education comprehension: verbalized understanding   HOME EXERCISE PROGRAM: See above.    ASSESSMENT:   CLINICAL IMPRESSION: Patient is a 62 y.o. female  who was seen today for physical therapy  treatment for pelvic floor dysfunction and dyssynergic defecation.   Patient pelvic floor strength is 3/5. She continues to use the rectal dilators to keep the tissue expanded. She is not straining to have a bowel movement. She is able to fully empty her rectum. Patient is independent with her HEP.     OBJECTIVE IMPAIRMENTS: decreased coordination, decreased strength, increased fascial restrictions, increased muscle spasms, and pain.    ACTIVITY LIMITATIONS: lifting, bending, and toileting   PARTICIPATION LIMITATIONS: community activity   PERSONAL FACTORS: Age, Time since onset of injury/illness/exacerbation, and 1-2 comorbidities: Abdominal Hysterectomy; cesarean section; Hemorrhoid surgery; Anal fissure; Endometriosis  are also affecting patient's functional outcome.    REHAB POTENTIAL: Excellent   CLINICAL DECISION MAKING: Evolving/moderate complexity   EVALUATION COMPLEXITY: Moderate     GOALS: Goals reviewed with patient? Yes   SHORT TERM GOALS: Target date: 12/22/22   Patient is independent with the use of the anal dilators Baseline: Goal status: Met 01/05/23   2.  Patient allows therapist to assess the anal sphincter rectal strength and work on the anal sphincter.  Baseline:  Goal status:  Met 01/20/23   3.  Patient reports her back to right calf pain decreased >/= 25% due to improved tissue mobiltiy.  Baseline:  Goal status: Met 01/27/23     LONG TERM GOALS: Target date: 02/15/23   Patient independent with advanced HEP for core and pelvic floor to improve bowel movements.  Baseline:  Goal status: Met 01/27/23   2.  Patient pain with bowel movements in her back to right calf decreased >/= 75% due to improved tissue mobility.  Baseline: sometimes Goal status: Met  02/07/23   3.  Patient is able to fully empty her rectum due to full relaxation of the rectum and improved tissue mobility of the abdomen.  Baseline: at this time able but the rectum will be checked next week.  Goal status: Met 02/07/23   4.  Patient reports she is straining </= 50% due to improved coordination of the pelvic floor muscles.  Baseline: no straining since procedure Goal status: Met 02/07/23      PLAN: discharge To HEP     Eulis Foster, PT 02/07/23 10:01 AM   PHYSICAL THERAPY DISCHARGE SUMMARY  Visits from Start of Care: 8  Current functional level related to goals / functional outcomes: See above.    Remaining deficits: See above.    Education / Equipment: HEP and rectal dilator   Patient agrees to discharge. Patient goals were met. Patient is being discharged due to meeting the stated rehab goals. Thank you for the referral. Eulis Foster, PT 02/07/23 10:01 AM

## 2023-02-08 ENCOUNTER — Ambulatory Visit: Payer: BC Managed Care – PPO | Admitting: Physical Therapy

## 2023-02-08 ENCOUNTER — Encounter: Payer: Self-pay | Admitting: Physical Therapy

## 2023-02-08 DIAGNOSIS — M5459 Other low back pain: Secondary | ICD-10-CM | POA: Diagnosis not present

## 2023-02-08 DIAGNOSIS — R252 Cramp and spasm: Secondary | ICD-10-CM

## 2023-02-08 DIAGNOSIS — M546 Pain in thoracic spine: Secondary | ICD-10-CM

## 2023-02-08 NOTE — Therapy (Signed)
OUTPATIENT PHYSICAL THERAPY THORACOLUMBAR TREATMENT   Patient Name: Alyssa Cox MRN: [id number] DOB:08-31-1961, 62 y.o., female Today's Date: 02/08/2023  END OF SESSION:  PT End of Session - 02/08/23 0753     Visit Number 9    Date for PT Re-Evaluation 02/13/23    Authorization Type BCBS    PT Start Time 0750    PT Stop Time 0840    PT Time Calculation (min) 50 min    Activity Tolerance Patient tolerated treatment well    Behavior During Therapy [facility] for tasks assessed/performed             Past Medical History:  Diagnosis Date   Allergic rhinitis    Anal fissure    Chronic idiopathic constipation    Colon polyps    Endometriosis    Pulled muscle 10/2022   Past Surgical History:  Procedure Laterality Date   ABDOMINAL HYSTERECTOMY     ANAL FISSURE REPAIR N/A 01/04/2023   Procedure: ANAL DILATION;  Surgeon: Romie Levee, MD;  Location: [facility];  Service: General;  Laterality: N/A;   CESAREAN SECTION     HEMORRHOID SURGERY     SPHINCTEROTOMY N/A 01/04/2023   Procedure: CHEMICAL SPHINCTEROTOMY;  Surgeon: Romie Levee, MD;  Location: [facility] ;  Service: General;  Laterality: N/A;   VASCULAR SURGERY     Patient Active Problem List   Diagnosis Date Noted   Lumbar radiculopathy 11/18/2022   Leukopenia 06/22/2022   Sciatica of right side 06/22/2022   Flu vaccine need 06/22/2022   Allergic rhinitis 12/17/2021   Other fatigue 12/17/2021   Allergic reaction 11/26/2021   Xerosis cutis 11/26/2021   Gynecologic exam normal 11/19/2021   History of total abdominal hysterectomy and bilateral salpingo-oophorectomy 11/19/2021   Vaginal atrophy 11/19/2021   Glaucoma 09/23/2021   Healthcare maintenance 09/23/2021   Plantar fasciitis 09/23/2021   Need for shingles vaccine 09/23/2021    PCP: Doreene Burke  REFERRING PROVIDER: Marcelino Scot DIAG: Low back pain  Rationale for Evaluation and Treatment: Rehabilitation  THERAPY DIAG:   Other low back pain  Cramp and spasm  Pain in thoracic spine  ONSET DATE: 11/14/22  SUBJECTIVE:                                                                                                                                                                                           SUBJECTIVE STATEMENT: Patient reports that she was pretty sore after the last treatment also reports that this weekend she digging using a shovel, she reports that she is very sore and stiff PERTINENT HISTORY:  See above  PAIN:  Are you having pain? Yes: NPRS scale: 3/10 Pain location: right low back and into the thoracic area sciatic issues right LE Pain description: sciatic, tightness Aggravating factors: bending, twisting, pain 9/10 Relieving factors: lie on floor, light stretching, at best pain a 1/10  PRECAUTIONS: None  WEIGHT BEARING RESTRICTIONS: No  FALLS:  Has patient fallen in last 6 months? Yes. Number of falls 1  LIVING ENVIRONMENT: Lives with: lives with their family Lives in: House/apartment Stairs: Yes: Internal: 16 steps; can reach both Has following equipment at home: None  OCCUPATION: retired does some book keeping sitting  PLOF: Independent  PATIENT GOALS: less pain and feel stronger  NEXT MD VISIT: March 7th  OBJECTIVE:   DIAGNOSTIC FINDINGS:   1. No acute abnormality of the thoracic or lumbar spine. 2. Multilevel degenerative changes of the lumbar spine, greatest at L5-S1. 3. Chronic mild compression deformities of T8, T9, T11, and L1 vertebral bodies.  PATIENT SURVEYS:  FOTO 43.6 COGNITION: Overall cognitive status: Within functional limits for tasks assessed     SENSATION: WFL  MUSCLE LENGTH: Hamstrings: Right 50 deg; Left 60 deg Very tight calves and right quad  POSTURE: rounded shoulders, forward head, and decreased lumbar lordosis  PALPATION: Tight mild tenderness in the right lumbar area, very tender in the right buttock  LUMBAR ROM: decreased  25% with mild tgihtness and pain   LOWER EXTREMITY ROM:   tight HS, calf and piriformis on the right   LOWER EXTREMITY MMT:    MMT Right eval Left eval  Hip flexion 4- 4-  Hip extension    Hip abduction 4- 4-  Hip adduction    Hip internal rotation    Hip external rotation    Knee flexion 4 4  Knee extension 4 4  Ankle dorsiflexion    Ankle plantarflexion    Ankle inversion    Ankle eversion     (Blank rows = not tested)  LUMBAR SPECIAL TESTS:  Straight leg raise test: Positive and Thomas test: Positive  GAIT: WFL's just a slight limp on the right  TODAY'S TREATMENT:                                                                                                                              DATE:   02/08/23 Nustep level 5 x 5 mintues 10# straight arm pulls Red tband horizontal abduction Weighted ball facing wall rhythmic stabilization Red tband ER Seated ball stretch roll outs Feet on ball K2C, trunk rotation, bridge, iso abs PAssive HS, piriformis stretch DN tot he right lumbar and the right buttock STM to the right thoracic, right lumbar and right buttock  01/31/23 DN to the right thoracic paraspinals and the right buttock STM to the above  PROM of the LE's PA and rotational glides of the T spine  01/24/23 Bike level 4 x 5 minutes Elliptical level 3 x 3 minutes cues for posture and form Green tband scapular stability  exercises Red tband hip exercises Supine exercises K2C, trunk rotation, bridge and iso,metric abs Passive stretch and then patient active stretch HS and piriformis mm DN to the right rhomboid and right buttock STM to the above  01/10/23 Nustep level 5 x 6 minutes Straight arm pulls 5# Calf stretches Lats 20# Rows 20# 5# hip extenisona nd abduction bilateral 2x10 Leg press 30# 2x10 Back to walk W backs 3# Green tband horizontal abduction Feet on ball K2C, trunk rotation, small bridges and isometric abs Passive HS and piriformis  stretch DN to the right gluteus medius, piriformis STM to the above and into the low back and rhomboid area  01/03/23 Bike level 4 x 6 minutes Straight arm pulls 5# cues for posture 5# Calf stretches 20# Rows 20# lats 40# leg press 20# leg curls 2x10 Green tband AR press Feet on ball K2C, trunk rotation, small bridge, isometric abs Passive LE stretches STM with her left side lying to the right paraspinals  12/29/22 Reviewed HEP, some verbal and tactile cues needed Nustep level 5 x 5 minutes Reviewed posture on an elliptical that she has at home Calf stretches Passive HS and piriformis stretches 5# straight arm pulls 15# rows 20# lats all with cues for posture and form Feet on ball K2C, trunk rotaiton, small bridge and isometric abs STM to the lumbar and thoracic area  PATIENT EDUCATION:  Education details: POC/HEP Person educated: Patient Education method: Explanation, Demonstration, Tactile cues, Verbal cues, and Handouts Education comprehension: verbalized understanding  HOME EXERCISE PROGRAM: Access Code: [email or web address] URL: [email or web address] Date: 12/15/2022 Prepared by: Stacie Glaze  Exercises - Standing Row with Anchored Resistance  - 1 x daily - 7 x weekly - 3 sets - 10 reps - 3 hold - Shoulder extension with resistance - Neutral  - 1 x daily - 7 x weekly - 3 sets - 10 reps - 3 hold - Supine Quadriceps Stretch with Strap on Table  - 2 x daily - 7 x weekly - 1 sets - 5 reps - 20 hold  ASSESSMENT:  CLINICAL IMPRESSION: Patient very busy at hoime, had a little bit of a set back with the digging that she did this weekend.  She is tight and sore and c/o stiffness.  She is very active and is walking again and reports that she thinks that she is getting better just frustration with the soreness OBJECTIVE IMPAIRMENTS: cardiopulmonary status limiting activity, decreased activity tolerance, decreased coordination, decreased endurance, decreased mobility,  difficulty walking, decreased ROM, decreased strength, increased muscle spasms, impaired flexibility, improper body mechanics, postural dysfunction, and pain.   REHAB POTENTIAL: Good  CLINICAL DECISION MAKING: Stable/uncomplicated  EVALUATION COMPLEXITY: Low   GOALS: Goals reviewed with patient? Yes  SHORT TERM GOALS: Target date: 12/26/22  Independent with initial HEP Goal status: met  LONG TERM GOALS: Target date: 03/15/23  Understand posture and body mechanics Goal status:met 01/24/23  2.  Increase lumbar ROM to WNL's Goal status: met  3.  Increase flexibility of the right quad to when she is in prone passively we can get her heel to her buttock Goal status:progressing 02/08/23  4.  Report pain decreased 50% Goal status:ongoing 01/03/23  5.  Independent and safe with gym program Goal status: progressing  PLAN:  PT FREQUENCY: 1-2x/week  PT DURATION: 12 weeks  PLANNED INTERVENTIONS: Therapeutic exercises, Therapeutic activity, Neuromuscular re-education, Balance training, Gait training, Patient/Family education, Self Care, Joint mobilization, Dry Needling, Electrical stimulation, Spinal mobilization, Taping, Traction, and Manual therapy.  PLAN FOR  NEXT SESSION: continue to progress strength and HEP   , W, PT 02/08/2023, 7:54 AM

## 2023-02-14 ENCOUNTER — Ambulatory Visit: Payer: BC Managed Care – PPO | Admitting: Physical Therapy

## 2023-02-15 ENCOUNTER — Ambulatory Visit (INDEPENDENT_AMBULATORY_CARE_PROVIDER_SITE_OTHER): Payer: BC Managed Care – PPO | Admitting: Family Medicine

## 2023-02-15 ENCOUNTER — Encounter: Payer: Self-pay | Admitting: Family Medicine

## 2023-02-15 VITALS — BP 120/72 | HR 75 | Temp 98.3°F | Ht 65.0 in | Wt 137.6 lb

## 2023-02-15 DIAGNOSIS — J040 Acute laryngitis: Secondary | ICD-10-CM | POA: Insufficient documentation

## 2023-02-15 NOTE — Progress Notes (Signed)
Established Patient Office Visit   Subjective:  Patient ID: Alyssa Cox, female    DOB: 15-Sep-1961  Age: 62 y.o. MRN: [id number]  Chief Complaint  Patient presents with   Cough    Cough, hoarse voice body aches x 2 days.     Cough Associated symptoms include headaches and myalgias. Pertinent negatives include no eye redness, rash, shortness of breath or wheezing.   Encounter Diagnoses  Name Primary?   Laryngitis Yes   Presents with a 2-day history of congestion, postnasal drip, loss of voice, cough with myalgias and arthralgias.  There is been a frontal headache.  She denies fevers chills, nausea vomiting, difficulty breathing or wheezing.  There has been green phlegm.   Review of Systems  Constitutional: Negative.   HENT: Negative.    Eyes:  Negative for blurred vision, discharge and redness.  Respiratory:  Positive for cough. Negative for shortness of breath and wheezing.   Cardiovascular: Negative.   Gastrointestinal:  Negative for abdominal pain, nausea and vomiting.  Genitourinary: Negative.   Musculoskeletal:  Positive for joint pain and myalgias.  Skin:  Negative for rash.  Neurological:  Positive for headaches. Negative for tingling, loss of consciousness and weakness.  Endo/Heme/Allergies:  Negative for polydipsia.     Current Outpatient Medications:    albuterol (VENTOLIN HFA) 108 (90 Base) MCG/ACT inhaler, Inhale 1-2 puffs into the lungs every 6 (six) hours as needed for wheezing or shortness of breath., Disp: 8 g, Rfl: 2   AMBULATORY NON FORMULARY MEDICATION, Medication Name: Diltiazem 2% with lidocaine 2% cream.  Apply a pea sized amount per rectum 3 times a day [employer], Disp: 30 g, Rfl: 0   Chlorphen-PE-Acetaminophen 4-10-325 MG TABS, May take 1 every 6 hours as needed., Disp: 30 tablet, Rfl: 0   cholecalciferol (VITAMIN D3) 25 MCG (1000 UNIT) tablet, Take 1,000 Units by mouth daily., Disp: , Rfl:    fluticasone (FLONASE) 50 MCG/ACT nasal spray,  SPRAY 2 SPRAYS INTO EACH NOSTRIL EVERY DAY, Disp: 48 mL, Rfl: 2   hydrocortisone 2.5 % ointment, SMARTSIG:sparingly Topical Twice Daily, Disp: , Rfl:    latanoprost (XALATAN) 0.005 % ophthalmic solution, latanoprost 0.005 % eye drops  INSTILL 1 DROP INTO BOTH EYES AT BEDTIME, Disp: , Rfl:    meloxicam (MOBIC) 7.5 MG tablet, Take 1 tablet (7.5 mg total) by mouth daily as needed for pain., Disp: 30 tablet, Rfl: 2   Multiple Vitamins-Minerals (MULTIVITAMIN WOMEN 50+ PO), Take by mouth., Disp: , Rfl:    traMADol (ULTRAM) 50 MG tablet, Take 1-2 tablets (50-100 mg total) by mouth every 6 (six) hours as needed., Disp: 30 tablet, Rfl: 1   calcium carbonate (OS-CAL - DOSED IN MG OF ELEMENTAL CALCIUM) 1250 (500 Ca) MG tablet, Take 1 tablet by mouth. (Patient not taking: Reported on 01/11/2023), Disp: , Rfl:    Objective:     BP 120/72 (BP Location: Right Arm, Patient Position: Sitting, Cuff Size: Normal)   Pulse 75   Temp 98.3 F (36.8 C) (Temporal)   Ht 5\' 5"  (1.651 m)   Wt 137 lb 9.6 oz (62.4 kg)   SpO2 99%   BMI 22.90 kg/m    Physical Exam Constitutional:      General: She is not in acute distress.    Appearance: Normal appearance. She is not ill-appearing, toxic-appearing or diaphoretic.  HENT:     Head: Normocephalic and atraumatic.     Right Ear: Tympanic membrane, ear canal and external ear normal.  Left Ear: Tympanic membrane, ear canal and external ear normal.     Mouth/Throat:     Mouth: Mucous membranes are moist.     Pharynx: Oropharynx is clear. No oropharyngeal exudate or posterior oropharyngeal erythema.  Eyes:     General: No scleral icterus.       Right eye: No discharge.        Left eye: No discharge.     Extraocular Movements: Extraocular movements intact.     Conjunctiva/sclera: Conjunctivae normal.     Pupils: Pupils are equal, round, and reactive to light.  Cardiovascular:     Rate and Rhythm: Normal rate and regular rhythm.  Pulmonary:     Effort: Pulmonary  effort is normal. No respiratory distress.     Breath sounds: Normal breath sounds. No wheezing, rhonchi or rales.  Abdominal:     General: Bowel sounds are normal.  Musculoskeletal:     Cervical back: No rigidity or tenderness.  Skin:    General: Skin is warm and dry.  Neurological:     Mental Status: She is alert and oriented to person, place, and time.  Psychiatric:        Mood and Affect: Mood normal.        Behavior: Behavior normal.      No results found for any visits on 02/15/23.    The ASCVD Risk score (Arnett DK, et al., 2019) failed to calculate for the following reasons:   The valid HDL cholesterol range is 20 to 100 mg/dL    Assessment & Plan:   Laryngitis    Return Let me know if not improved by the first of next week..  Information on laryngitis given.  Continue over-the-counter measures such as throat lozenges and warm tea.  Mliss Sax, MD

## 2023-02-16 ENCOUNTER — Encounter: Payer: Self-pay | Admitting: Physical Therapy

## 2023-02-16 ENCOUNTER — Ambulatory Visit: Payer: BC Managed Care – PPO | Attending: Family Medicine | Admitting: Physical Therapy

## 2023-02-16 DIAGNOSIS — R252 Cramp and spasm: Secondary | ICD-10-CM | POA: Diagnosis present

## 2023-02-16 DIAGNOSIS — M546 Pain in thoracic spine: Secondary | ICD-10-CM | POA: Diagnosis present

## 2023-02-16 DIAGNOSIS — M5459 Other low back pain: Secondary | ICD-10-CM | POA: Insufficient documentation

## 2023-02-16 NOTE — Therapy (Signed)
OUTPATIENT PHYSICAL THERAPY THORACOLUMBAR TREATMENT   Patient Name: Alyssa Cox MRN: [id number] DOB:December 27, 1960, 62 y.o., female Today's Date: 02/16/2023  END OF SESSION:  PT End of Session - 02/16/23 1739     Visit Number 10    Date for PT Re-Evaluation 03/19/23    Authorization Type [employer]    PT Start Time 1735    PT Stop Time 1825    PT Time Calculation (min) 50 min    Activity Tolerance Patient tolerated treatment well    Behavior During Therapy WFL for tasks assessed/performed             Past Medical History:  Diagnosis Date   Allergic rhinitis    Anal fissure    Chronic idiopathic constipation    Colon polyps    Endometriosis    Pulled muscle 10/2022   Past Surgical History:  Procedure Laterality Date   ABDOMINAL HYSTERECTOMY     ANAL FISSURE REPAIR N/A 01/04/2023   Procedure: ANAL DILATION;  Surgeon: Romie Levee, MD;  Location: [facility];  Service: General;  Laterality: N/A;   CESAREAN SECTION     HEMORRHOID SURGERY     SPHINCTEROTOMY N/A 01/04/2023   Procedure: CHEMICAL SPHINCTEROTOMY;  Surgeon: Romie Levee, MD;  Location: [facility];  Service: General;  Laterality: N/A;   VASCULAR SURGERY     Patient Active Problem List   Diagnosis Date Noted   Laryngitis 02/15/2023   Lumbar radiculopathy 11/18/2022   Leukopenia 06/22/2022   Sciatica of right side 06/22/2022   Flu vaccine need 06/22/2022   Allergic rhinitis 12/17/2021   Other fatigue 12/17/2021   Allergic reaction 11/26/2021   Xerosis cutis 11/26/2021   Gynecologic exam normal 11/19/2021   History of total abdominal hysterectomy and bilateral salpingo-oophorectomy 11/19/2021   Vaginal atrophy 11/19/2021   Glaucoma 09/23/2021   Healthcare maintenance 09/23/2021   Plantar fasciitis 09/23/2021   Need for shingles vaccine 09/23/2021    PCP: Doreene Burke  REFERRING PROVIDER: Marcelino Scot DIAG: Low back pain  Rationale for Evaluation and Treatment:  Rehabilitation  THERAPY DIAG:  Other low back pain  Cramp and spasm  Pain in thoracic spine  ONSET DATE: 11/14/22  SUBJECTIVE:                                                                                                                                                                                           SUBJECTIVE STATEMENT: Patient reports that she was out of town the past week and had been doing very well with the trip until she got a chest cold, reports a lot of coughing and now  having right low back, buttock, rhomboid and upper trap pain and spasm PERTINENT HISTORY:  See above  PAIN:  Are you having pain? Yes: NPRS scale: 6/10 Pain location: right low back and into the thoracic area sciatic issues right LE Pain description: sciatic, tightness Aggravating factors: bending, twisting, pain 9/10 Relieving factors: lie on floor, light stretching, at best pain a 1/10  PRECAUTIONS: None  WEIGHT BEARING RESTRICTIONS: No  FALLS:  Has patient fallen in last 6 months? Yes. Number of falls 1  LIVING ENVIRONMENT: Lives with: lives with their family Lives in: House/apartment Stairs: Yes: Internal: 16 steps; can reach both Has following equipment at home: None  OCCUPATION: retired does some book keeping sitting  PLOF: Independent  PATIENT GOALS: less pain and feel stronger  NEXT MD VISIT: March 7th  OBJECTIVE:   DIAGNOSTIC FINDINGS:   1. No acute abnormality of the thoracic or lumbar spine. 2. Multilevel degenerative changes of the lumbar spine, greatest at L5-S1. 3. Chronic mild compression deformities of T8, T9, T11, and L1 vertebral bodies.  PATIENT SURVEYS:  FOTO 43.6 COGNITION: Overall cognitive status: Within functional limits for tasks assessed     SENSATION: WFL  MUSCLE LENGTH: Hamstrings: Right 50 deg; Left 60 deg Very tight calves and right quad  POSTURE: rounded shoulders, forward head, and decreased lumbar lordosis  PALPATION: Tight mild  tenderness in the right lumbar area, very tender in the right buttock  LUMBAR ROM: decreased 25% with mild tgihtness and pain   LOWER EXTREMITY ROM:   tight HS, calf and piriformis on the right   LOWER EXTREMITY MMT:    MMT Right eval Left eval  Hip flexion 4- 4-  Hip extension    Hip abduction 4- 4-  Hip adduction    Hip internal rotation    Hip external rotation    Knee flexion 4 4  Knee extension 4 4  Ankle dorsiflexion    Ankle plantarflexion    Ankle inversion    Ankle eversion     (Blank rows = not tested)  LUMBAR SPECIAL TESTS:  Straight leg raise test: Positive and Thomas test: Positive  GAIT: WFL's just a slight limp on the right  TODAY'S TREATMENT:                                                                                                                              DATE:   02/16/23 DN to the right thoracic pspspinals STM to the right upper trap, rhomboid, thoraci, lumbar and right buttock Passive stretch to the LE's Reviewed the HEP MHP/IFC  02/08/23 Nustep level 5 x 5 mintues 10# straight arm pulls Red tband horizontal abduction Weighted ball facing wall rhythmic stabilization Red tband ER Seated ball stretch roll outs Feet on ball K2C, trunk rotation, bridge, iso abs PAssive HS, piriformis stretch DN tot he right lumbar and the right buttock STM to the right thoracic, right lumbar and right buttock  01/31/23 DN  to the right thoracic paraspinals and the right buttock STM to the above  PROM of the LE's PA and rotational glides of the T spine  01/24/23 Bike level 4 x 5 minutes Elliptical level 3 x 3 minutes cues for posture and form Green tband scapular stability exercises Red tband hip exercises Supine exercises K2C, trunk rotation, bridge and iso,metric abs Passive stretch and then patient active stretch HS and piriformis mm DN to the right rhomboid and right buttock STM to the above  01/10/23 Nustep level 5 x 6 minutes Straight arm  pulls 5# Calf stretches Lats 20# Rows 20# 5# hip extenisona nd abduction bilateral 2x10 Leg press 30# 2x10 Back to walk W backs 3# Green tband horizontal abduction Feet on ball K2C, trunk rotation, small bridges and isometric abs Passive HS and piriformis stretch DN to the right gluteus medius, piriformis STM to the above and into the low back and rhomboid area  01/03/23 Bike level 4 x 6 minutes Straight arm pulls 5# cues for posture 5# Calf stretches 20# Rows 20# lats 40# leg press 20# leg curls 2x10 Green tband AR press Feet on ball K2C, trunk rotation, small bridge, isometric abs Passive LE stretches STM with her left side lying to the right paraspinals  12/29/22 Reviewed HEP, some verbal and tactile cues needed Nustep level 5 x 5 minutes Reviewed posture on an elliptical that she has at home Calf stretches Passive HS and piriformis stretches 5# straight arm pulls 15# rows 20# lats all with cues for posture and form Feet on ball K2C, trunk rotaiton, small bridge and isometric abs STM to the lumbar and thoracic area  PATIENT EDUCATION:  Education details: POC/HEP Person educated: Patient Education method: Explanation, Demonstration, Tactile cues, Verbal cues, and Handouts Education comprehension: verbalized understanding  HOME EXERCISE PROGRAM: Access Code: [email or web address] URL: [email or web address] Date: 12/15/2022 Prepared by: Stacie Glaze  Exercises - Standing Row with Anchored Resistance  - 1 x daily - 7 x weekly - 3 sets - 10 reps - 3 hold - Shoulder extension with resistance - Neutral  - 1 x daily - 7 x weekly - 3 sets - 10 reps - 3 hold - Supine Quadriceps Stretch with Strap on Table  - 2 x daily - 7 x weekly - 1 sets - 5 reps - 20 hold  ASSESSMENT:  CLINICAL IMPRESSION: Patient reports that she was doing very well until she got a cold and reports that a lot of coughing has set off some pain and spasms, I worked on this today she was very  tight and tender, if all is well next visit finalize the HEP and posture body mechanics and prepare for /C OBJECTIVE IMPAIRMENTS: cardiopulmonary status limiting activity, decreased activity tolerance, decreased coordination, decreased endurance, decreased mobility, difficulty walking, decreased ROM, decreased strength, increased muscle spasms, impaired flexibility, improper body mechanics, postural dysfunction, and pain.   REHAB POTENTIAL: Good  CLINICAL DECISION MAKING: Stable/uncomplicated  EVALUATION COMPLEXITY: Low   GOALS: Goals reviewed with patient? Yes  SHORT TERM GOALS: Target date: 12/26/22  Independent with initial HEP Goal status: met  LONG TERM GOALS: Target date: 03/15/23  Understand posture and body mechanics Goal status:met 01/24/23  2.  Increase lumbar ROM to WNL's Goal status: met  3.  Increase flexibility of the right quad to when she is in prone passively we can get her heel to her buttock Goal status:progressing 02/08/23  4.  Report pain decreased 50% Goal status:ongoing 01/03/23  5.  Independent and safe  with gym program Goal status: progressing  PLAN:  PT FREQUENCY: 1-2x/week  PT DURATION: 12 weeks  PLANNED INTERVENTIONS: Therapeutic exercises, Therapeutic activity, Neuromuscular re-education, Balance training, Gait training, Patient/Family education, Self Care, Joint mobilization, Dry Needling, Electrical stimulation, Spinal mobilization, Taping, Traction, and Manual therapy.  PLAN FOR NEXT SESSION: will look to possibly d/c   Jearld Lesch, PT 02/16/2023, 6:14 PM

## 2023-02-23 ENCOUNTER — Ambulatory Visit: Payer: BC Managed Care – PPO | Admitting: Physical Therapy

## 2023-02-23 ENCOUNTER — Encounter: Payer: Self-pay | Admitting: Physical Therapy

## 2023-02-23 ENCOUNTER — Telehealth: Payer: Self-pay | Admitting: Family Medicine

## 2023-02-23 DIAGNOSIS — M5459 Other low back pain: Secondary | ICD-10-CM

## 2023-02-23 DIAGNOSIS — R252 Cramp and spasm: Secondary | ICD-10-CM

## 2023-02-23 DIAGNOSIS — M546 Pain in thoracic spine: Secondary | ICD-10-CM

## 2023-02-23 DIAGNOSIS — J22 Unspecified acute lower respiratory infection: Secondary | ICD-10-CM

## 2023-02-23 MED ORDER — DOXYCYCLINE HYCLATE 100 MG PO TABS: 100.0000 mg | ORAL_TABLET | Freq: Two times a day (BID) | ORAL | 0 refills | Status: AC

## 2023-02-23 NOTE — Therapy (Signed)
OUTPATIENT PHYSICAL THERAPY THORACOLUMBAR TREATMENT   Patient Name: Alyssa Cox MRN: [id number] DOB:Nov 27, 1960, 62 y.o., female Today's Date: 02/23/2023  END OF SESSION:  PT End of Session - 02/23/23 0843     Visit Number 11    Date for PT Re-Evaluation 03/19/23    Authorization Type BCBS    PT Start Time 0841    PT Stop Time 0924    PT Time Calculation (min) 43 min    Activity Tolerance Patient tolerated treatment well    Behavior During Therapy [facility] for tasks assessed/performed             Past Medical History:  Diagnosis Date   Allergic rhinitis    Anal fissure    Chronic idiopathic constipation    Colon polyps    Endometriosis    Pulled muscle 10/2022   Past Surgical History:  Procedure Laterality Date   ABDOMINAL HYSTERECTOMY     ANAL FISSURE REPAIR N/A 01/04/2023   Procedure: ANAL DILATION;  Surgeon: Romie Levee, MD;  Location: [facility];  Service: General;  Laterality: N/A;   CESAREAN SECTION     HEMORRHOID SURGERY     SPHINCTEROTOMY N/A 01/04/2023   Procedure: CHEMICAL SPHINCTEROTOMY;  Surgeon: Romie Levee, MD;  Location: [facility];  Service: General;  Laterality: N/A;   VASCULAR SURGERY     Patient Active Problem List   Diagnosis Date Noted   Laryngitis 02/15/2023   Lumbar radiculopathy 11/18/2022   Leukopenia 06/22/2022   Sciatica of right side 06/22/2022   Flu vaccine need 06/22/2022   Allergic rhinitis 12/17/2021   Other fatigue 12/17/2021   Allergic reaction 11/26/2021   Xerosis cutis 11/26/2021   Gynecologic exam normal 11/19/2021   History of total abdominal hysterectomy and bilateral salpingo-oophorectomy 11/19/2021   Vaginal atrophy 11/19/2021   Glaucoma 09/23/2021   Healthcare maintenance 09/23/2021   Plantar fasciitis 09/23/2021   Need for shingles vaccine 09/23/2021    PCP: Doreene Burke  REFERRING PROVIDER: Marcelino Scot DIAG: Low back pain  Rationale for Evaluation and Treatment:  Rehabilitation  THERAPY DIAG:  Other low back pain  Cramp and spasm  Pain in thoracic spine  ONSET DATE: 11/14/22  SUBJECTIVE:                                                                                                                                                                                           SUBJECTIVE STATEMENT: Doing well however the bronchitis has caused more coughing and some increased spasm in the rhomboid and lumbar area PERTINENT HISTORY:  See above  PAIN:  Are you having pain? Yes: NPRS  scale: 4/10 Pain location: right low back and into the thoracic area sciatic issues right LE Pain description: sciatic, tightness Aggravating factors: bending, twisting, pain 9/10 Relieving factors: lie on floor, light stretching, at best pain a 1/10  PRECAUTIONS: None  WEIGHT BEARING RESTRICTIONS: No  FALLS:  Has patient fallen in last 6 months? Yes. Number of falls 1  LIVING ENVIRONMENT: Lives with: lives with their family Lives in: House/apartment Stairs: Yes: Internal: 16 steps; can reach both Has following equipment at home: None  OCCUPATION: retired does some book keeping sitting  PLOF: Independent  PATIENT GOALS: less pain and feel stronger  NEXT MD VISIT: March 7th  OBJECTIVE:   DIAGNOSTIC FINDINGS:   1. No acute abnormality of the thoracic or lumbar spine. 2. Multilevel degenerative changes of the lumbar spine, greatest at L5-S1. 3. Chronic mild compression deformities of T8, T9, T11, and L1 vertebral bodies.  PATIENT SURVEYS:  FOTO 43.6 COGNITION: Overall cognitive status: Within functional limits for tasks assessed     SENSATION: WFL  MUSCLE LENGTH: Hamstrings: Right 50 deg; Left 60 deg Very tight calves and right quad  POSTURE: rounded shoulders, forward head, and decreased lumbar lordosis  PALPATION: Tight mild tenderness in the right lumbar area, very tender in the right buttock  LUMBAR ROM: decreased 25% with mild  tgihtness and pain   LOWER EXTREMITY ROM:   tight HS, calf and piriformis on the right   LOWER EXTREMITY MMT:    MMT Right eval Left eval  Hip flexion 4- 4-  Hip extension    Hip abduction 4- 4-  Hip adduction    Hip internal rotation    Hip external rotation    Knee flexion 4 4  Knee extension 4 4  Ankle dorsiflexion    Ankle plantarflexion    Ankle inversion    Ankle eversion     (Blank rows = not tested)  LUMBAR SPECIAL TESTS:  Straight leg raise test: Positive and Thomas test: Positive  GAIT: WFL's just a slight limp on the right  TODAY'S TREATMENT:                                                                                                                              DATE:   02/23/23 DN to the right thoracic and lumbar /buttock area STM to the above, Passive LE stretch and trunk rotation Issued advanced and final HEP and went over with patient with cues for form  02/16/23 DN to the right thoracic pspspinals STM to the right upper trap, rhomboid, thoraci, lumbar and right buttock Passive stretch to the LE's Reviewed the HEP MHP/IFC  02/08/23 Nustep level 5 x 5 mintues 10# straight arm pulls Red tband horizontal abduction Weighted ball facing wall rhythmic stabilization Red tband ER Seated ball stretch roll outs Feet on ball K2C, trunk rotation, bridge, iso abs PAssive HS, piriformis stretch DN tot he right lumbar and the right buttock STM to the right  thoracic, right lumbar and right buttock  01/31/23 DN to the right thoracic paraspinals and the right buttock STM to the above  PROM of the LE's PA and rotational glides of the T spine  01/24/23 Bike level 4 x 5 minutes Elliptical level 3 x 3 minutes cues for posture and form Green tband scapular stability exercises Red tband hip exercises Supine exercises K2C, trunk rotation, bridge and iso,metric abs Passive stretch and then patient active stretch HS and piriformis mm DN to the right rhomboid and  right buttock STM to the above  01/10/23 Nustep level 5 x 6 minutes Straight arm pulls 5# Calf stretches Lats 20# Rows 20# 5# hip extenisona nd abduction bilateral 2x10 Leg press 30# 2x10 Back to walk W backs 3# Green tband horizontal abduction Feet on ball K2C, trunk rotation, small bridges and isometric abs Passive HS and piriformis stretch DN to the right gluteus medius, piriformis STM to the above and into the low back and rhomboid area  01/03/23 Bike level 4 x 6 minutes Straight arm pulls 5# cues for posture 5# Calf stretches 20# Rows 20# lats 40# leg press 20# leg curls 2x10 Green tband AR press Feet on ball K2C, trunk rotation, small bridge, isometric abs Passive LE stretches STM with her left side lying to the right paraspinals  12/29/22 Reviewed HEP, some verbal and tactile cues needed Nustep level 5 x 5 minutes Reviewed posture on an elliptical that she has at home Calf stretches Passive HS and piriformis stretches 5# straight arm pulls 15# rows 20# lats all with cues for posture and form Feet on ball K2C, trunk rotaiton, small bridge and isometric abs STM to the lumbar and thoracic area  PATIENT EDUCATION:  Education details: POC/HEP Person educated: Patient Education method: Explanation, Demonstration, Tactile cues, Verbal cues, and Handouts Education comprehension: verbalized understanding  HOME EXERCISE PROGRAM: Access Code: [email or web address] URL: [email or web address] Date: 02/23/2023 Prepared by: Stacie Glaze  Exercises - Shoulder External Rotation and Scapular Retraction with Resistance  - 1 x daily - 7 x weekly - 3 sets - 10 reps - 30 hold - Scapular Retraction with Resistance  - 1 x daily - 7 x weekly - 3 sets - 10 reps - 30 hold - Single Arm Shoulder Extension with Anchored Resistance  - 1 x daily - 7 x weekly - 3 sets - 10 reps - 30 hold - Standing Hip Extension with Anchored Resistance  - 1 x daily - 7 x weekly - 3 sets - 10 reps  - 30 hold - Standing Hip Abduction with Anchored Resistance  - 1 x daily - 7 x weekly - 3 sets - 10 reps - 30 hold - Standing Repeated Hip Flexion with Resistance  - 1 x daily - 7 x weekly - 3 sets - 10 reps - 30 hold - Supine Hip and Knee Flexion AROM with Swiss Ball  - 1 x daily - 7 x weekly - 3 sets - 10 reps - 30 hold - Supine Lower Trunk Rotation with Swiss Ball  - 1 x daily - 7 x weekly - 3 sets - 10 reps - 30 hold - Supine Bridge with Pelvic Floor Contraction on Swiss Ball  - 1 x daily - 7 x weekly - 3 sets - 10 reps - 30 hold - Supine Piriformis Stretch Pulling Heel to Hip  - 1 x daily - 7 x weekly - 3 sets - 10 reps - 30 hold - Standing Bilateral Gastroc Stretch with Step  -  1 x daily - 7 x weekly - 3 sets - 10 reps - 30 hold - Sidelying Thoracic Rotation with Open Book  - 1 x daily - 7 x weekly - 3 sets - 10 reps - 30 hold - Quadruped Thoracic Rotation Full Range with Hand on Neck  - 1 x daily - 7 x weekly - 3 sets - 10 reps - 30 hold - Standing Anti-Rotation Press with Anchored Resistance  - 1 x daily - 7 x weekly - 3 sets - 10 reps - 30 hold   Access Code: [email or web address] URL: [email or web address] Date: 12/15/2022 Prepared by: Stacie Glaze  Exercises - Standing Row with Anchored Resistance  - 1 x daily - 7 x weekly - 3 sets - 10 reps - 3 hold - Shoulder extension with resistance - Neutral  - 1 x daily - 7 x weekly - 3 sets - 10 reps - 3 hold - Supine Quadriceps Stretch with Strap on Table  - 2 x daily - 7 x weekly - 1 sets - 5 reps - 20 hold  ASSESSMENT:  CLINICAL IMPRESSION: Patient reports that she was doing very well until she got a cold and reports that a lot of coughing has set off some pain and spasms,added new advanced HEP and went over this with her.  She did well and I answered all of her questions regarding form, posture and the HEP OBJECTIVE IMPAIRMENTS: cardiopulmonary status limiting activity, decreased activity tolerance, decreased coordination,  decreased endurance, decreased mobility, difficulty walking, decreased ROM, decreased strength, increased muscle spasms, impaired flexibility, improper body mechanics, postural dysfunction, and pain.   REHAB POTENTIAL: Good  CLINICAL DECISION MAKING: Stable/uncomplicated  EVALUATION COMPLEXITY: Low   GOALS: Goals reviewed with patient? Yes  SHORT TERM GOALS: Target date: 12/26/22  Independent with initial HEP Goal status: met  LONG TERM GOALS: Target date: 03/15/23  Understand posture and body mechanics Goal status:met 01/24/23  2.  Increase lumbar ROM to WNL's Goal status: met  3.  Increase flexibility of the right quad to when she is in prone passively we can get her heel to her buttock Goal status:met 02/23/23  4.  Report pain decreased 50% Goal status:met 02/23/23  5.  Independent and safe with gym program Goal status:met 02/23/23  PLAN:  PT FREQUENCY: 1-2x/week  PT DURATION: 12 weeks  PLANNED INTERVENTIONS: Therapeutic exercises, Therapeutic activity, Neuromuscular re-education, Balance training, Gait training, Patient/Family education, Self Care, Joint mobilization, Dry Needling, Electrical stimulation, Spinal mobilization, Taping, Traction, and Manual therapy.  PLAN FOR NEXT SESSION: d/c goals met   Jearld Lesch, PT 02/23/2023, 8:45 AM

## 2023-02-23 NOTE — Addendum Note (Signed)
Addended by: Nadene Rubins A on: 02/23/2023 12:02 PM   Modules accepted: Orders

## 2023-02-23 NOTE — Telephone Encounter (Signed)
Called patient to inform of message above no answer left detailed message on identified VM asked patient to call if no improvement or questions.

## 2023-02-23 NOTE — Telephone Encounter (Signed)
Caller Name: Breawna Call back phone #: [phone]  Reason for Call: Pt stated that Dr Doreene Burke told her to call after 10 days if she should not feel better. She feels another med may be needed. Does she need to come in or just get it called in? Please call

## 2023-02-23 NOTE — Telephone Encounter (Signed)
Patient calling states that she was asked to call back if no improvement with symptoms after 10 days. Patient not sure if she needs another round of antibiotics. Please advise.

## 2023-02-28 ENCOUNTER — Ambulatory Visit: Payer: BC Managed Care – PPO | Admitting: Physical Therapy

## 2023-03-08 ENCOUNTER — Ambulatory Visit (INDEPENDENT_AMBULATORY_CARE_PROVIDER_SITE_OTHER): Payer: BC Managed Care – PPO | Admitting: Nurse Practitioner

## 2023-03-08 ENCOUNTER — Encounter: Payer: Self-pay | Admitting: Nurse Practitioner

## 2023-03-08 VITALS — BP 110/60 | HR 63 | Temp 98.1°F | Resp 16 | Ht 65.0 in | Wt 135.6 lb

## 2023-03-08 DIAGNOSIS — S30861A Insect bite (nonvenomous) of abdominal wall, initial encounter: Secondary | ICD-10-CM | POA: Diagnosis not present

## 2023-03-08 DIAGNOSIS — A6920 Lyme disease, unspecified: Secondary | ICD-10-CM

## 2023-03-08 DIAGNOSIS — W57XXXA Bitten or stung by nonvenomous insect and other nonvenomous arthropods, initial encounter: Secondary | ICD-10-CM

## 2023-03-08 MED ORDER — DOXYCYCLINE HYCLATE 100 MG PO TABS: 100.0000 mg | ORAL_TABLET | Freq: Two times a day (BID) | ORAL | 0 refills | Status: AC

## 2023-03-08 NOTE — Progress Notes (Signed)
Acute Office Visit  Subjective:    Patient ID: ASHAE BEATTY, female    DOB: Mar 01, 1961, 62 y.o.   MRN: [id number]  Chief Complaint  Patient presents with   Insect Bite    Tick bite between fingers on right hand and one on left lower abdomin    HPI Patient is in today for tick bite to abdomen and right hand. She is not sure how long ticks were attached. She removed ticks, but has lingering redness and itching. No fevre or myalgia. She is unsure about type of ticks.  Outpatient Medications Prior to Visit  Medication Sig   albuterol (VENTOLIN HFA) 108 (90 Base) MCG/ACT inhaler Inhale 1-2 puffs into the lungs every 6 (six) hours as needed for wheezing or shortness of breath.   AMBULATORY NON FORMULARY MEDICATION Medication Name: Diltiazem 2% with lidocaine 2% cream.  Apply a pea sized amount per rectum 3 times a day [facility]   calcium carbonate (OS-CAL - DOSED IN MG OF ELEMENTAL CALCIUM) 1250 (500 Ca) MG tablet Take 1 tablet by mouth.   Chlorphen-PE-Acetaminophen 4-10-325 MG TABS May take 1 every 6 hours as needed.   cholecalciferol (VITAMIN D3) 25 MCG (1000 UNIT) tablet Take 1,000 Units by mouth daily.   fluticasone (FLONASE) 50 MCG/ACT nasal spray SPRAY 2 SPRAYS INTO EACH NOSTRIL EVERY DAY   hydrocortisone 2.5 % ointment SMARTSIG:sparingly Topical Twice Daily   latanoprost (XALATAN) 0.005 % ophthalmic solution latanoprost 0.005 % eye drops  INSTILL 1 DROP INTO BOTH EYES AT BEDTIME   meloxicam (MOBIC) 7.5 MG tablet Take 1 tablet (7.5 mg total) by mouth daily as needed for pain.   Multiple Vitamins-Minerals (MULTIVITAMIN WOMEN 50+ PO) Take by mouth.   traMADol (ULTRAM) 50 MG tablet Take 1-2 tablets (50-100 mg total) by mouth every 6 (six) hours as needed.   No facility-administered medications prior to visit.    Reviewed past medical and social history.  Review of Systems Per HPI     Objective:    Physical Exam Vitals and nursing note reviewed.  Constitutional:       General: She is not in acute distress. Cardiovascular:     Rate and Rhythm: Normal rate.     Pulses: Normal pulses.  Pulmonary:     Effort: Pulmonary effort is normal.  Abdominal:       Comments: Circular erythematous rash. Removed remnant of tick embedded in skin.  Skin:    Findings: Erythema and rash present.  Neurological:     Mental Status: She is alert and oriented to person, place, and time.    BP 110/60 (BP Location: Right Arm, Patient Position: Sitting, Cuff Size: Normal)   Pulse 63   Temp 98.1 F (36.7 C) (Temporal)   Resp 16   Ht 5\' 5"  (1.651 m)   Wt 135 lb 9.6 oz (61.5 kg)   SpO2 100%   BMI 22.57 kg/m    No results found for any visits on 03/08/23.     Assessment & Plan:   Problem List Items Addressed This Visit   None Visit Diagnoses     Tick bite of abdomen, initial encounter    -  Primary   Relevant Medications   doxycycline (VIBRA-TABS) 100 MG tablet   Erythema migrans (Lyme disease)       Relevant Medications   doxycycline (VIBRA-TABS) 100 MG tablet      Meds ordered this encounter  Medications   doxycycline (VIBRA-TABS) 100 MG tablet  Sig: Take 1 tablet (100 mg total) by mouth 2 (two) times daily for 7 days.    Dispense:  14 tablet    Refill:  0    Order Specific Question:   Supervising Provider    Answer:   Mliss Sax [[id number]]   Return if symptoms worsen or fail to improve.  Alysia Penna, NP

## 2023-03-08 NOTE — Patient Instructions (Signed)
[facility] [employer] in [place] [employer] in [place]  Lyme Disease Lyme disease is an infection that can affect many parts of the body, including the skin, joints, and nervous system. It is an infection that starts from the bite of an infected tick. Over time, the infection can worsen, and some of the symptoms are like those of the flu. If Lyme disease is not treated, it may cause joint pain, swelling, numbness, problems thinking, tiredness (fatigue), muscle weakness, and other problems. What are the causes? This condition is caused by a germ (bacteria) called Borrelia burgdorferi. You can get Lyme disease by being bitten by an infected tick. Only black-legged, or Ixodes, ticks that are infected with the germ can cause Lyme disease. The tick must be attached to your skin for a certain period of time to pass along the infection. This is usually 36-48 hours. Deer often carry infected ticks. What increases the risk? The following factors may make you more likely to develop this condition: Living in or visiting these areas in the [place]. The [place]. The [address] Spending time in wooded or grassy areas. Being outdoors with your skin not covered. Camping, gardening, hiking, fishing, hunting, or working outdoors. Failing to remove a tick from your skin. What are the signs or symptoms? Early symptoms of this condition may include: Chills and fever. Headache. Fatigue. General achiness. Joint or muscle pain. Swollen lymph glands. A red or purple rash that surrounds the center of the tick bite. The center of the rash may be blood colored or have tiny blisters. Later symptoms may vary depending on the affected body part. They may include: Weakness or drooping on one side of the face (facial palsy). The rash spreading or appearing on other parts of the body. Severe joint pain and swelling, often in the knees and other large joints. Irregular  heartbeats (palpitations). Feeling light-headed or having shortness of breath. Memory problems or trouble concentrating. Pain, numbness, or tingling in the hands or feet. Stiff neck. In some cases, mental health symptoms may also appear, such as depression and feeling worried or nervous. How is this diagnosed? This condition is diagnosed based on: Your symptoms and medical history. A physical exam. Blood tests. How is this treated? The main treatment for this condition is antibiotics. This medicine is usually taken by mouth (orally). The length of treatment depends on how soon after a tick bite you begin taking the medicine. In some cases, treatment is needed for several weeks. If the infection is severe, antibiotics may need to be given through an IV that is inserted into one of your veins. Other treatments will be based on the symptoms that you have. Talk with your health care provider about other treatments. Follow these instructions at home: Take over-the-counter and prescription medicines as told by your provider. Finish your antibiotics even if you start to feel better. Ask your provider about taking a probiotic in between doses of your antibiotic to help avoid an upset stomach or diarrhea. Maintain a healthy lifestyle. This includes: Eating a healthy diet. Getting enough sleep. Doing exercises as told by your provider. Consider joining a support group. Keep all follow-up visits. Your provider will check whether the treatments work and help you manage symptoms. How is this prevented? You can become infected again if you get another tick bite from an infected tick. Take these steps to help prevent this: Cover your skin with light-colored clothing when you are outdoors in  the spring and summer months. Spray clothing and skin with bug spray. The spray should be 20-30% DEET. You can also treat camping gear, boots, and clothing with permethrin. Let it dry before you wear it. Do not  apply permethrin directly to your skin. Always follow the instructions that come with bug spray or insecticide. Avoid wooded, grassy, and shaded areas. Remove yard litter, brush, trash, and plants that attract deer and rodents. Check yourself for ticks when you come indoors. Shower and wash clothing after spending time outdoors. Check your pets for ticks before they come inside. If you find a tick attached to your skin: Remove it with tweezers. Clean your hands and the bite area with rubbing alcohol or soap and water. Dispose of the tick by putting it in rubbing alcohol, putting it in a sealed bag or container, or flushing it down the toilet. You may choose to save the tick in a sealed container if you wish for it to be tested at a later time. Pregnant women should take special care to avoid tick bites because it is possible that the infection may be passed along to the fetus. Where to find support Global Lyme Alliance: globallymealliance.org LymeLight Foundation: lymelightfoundation.org Where to find more information Centers for Disease Control and Prevention: TonerPromos.no Contact a health care provider if: You have new symptoms. Your symptoms get worse. You have symptoms after treatment. You have removed a tick and want to bring it to your provider for testing. Get help right away if: You have chest pain. You develop the following: A stiff neck. A severe headache. Severe nausea and vomiting. Sensitivity to light. These symptoms may be an emergency. Get help right away. Call 911. Do not wait to see if the symptoms will go away. Do not drive yourself to the hospital. This information is not intended to replace advice given to you by your health care provider. Make sure you discuss any questions you have with your health care provider. Document Revised: 06/07/2022 Document Reviewed: 06/07/2022 Elsevier Patient Education  2023 [employer].

## 2023-03-22 ENCOUNTER — Telehealth: Payer: Self-pay | Admitting: Family Medicine

## 2023-03-22 DIAGNOSIS — S30861A Insect bite (nonvenomous) of abdominal wall, initial encounter: Secondary | ICD-10-CM

## 2023-03-22 NOTE — Telephone Encounter (Signed)
Please advise message below will patient need to come in for evaluation or is this normal?

## 2023-03-22 NOTE — Telephone Encounter (Signed)
Pt was seen on 5/21 for tic bite. She was given an antibiotic. She has finished it and it is still red and looks like a pimple. Does she need anything else or is this normal? Please call

## 2023-03-23 MED ORDER — MUPIROCIN 2 % EX OINT: TOPICAL_OINTMENT | CUTANEOUS | 0 refills | Status: DC

## 2023-03-23 NOTE — Telephone Encounter (Signed)
Patient aware Rx sent in per patient she will start today and call back if no improvement

## 2023-04-01 ENCOUNTER — Other Ambulatory Visit: Payer: Self-pay | Admitting: Family Medicine

## 2023-04-01 DIAGNOSIS — J309 Allergic rhinitis, unspecified: Secondary | ICD-10-CM

## 2023-04-02 ENCOUNTER — Other Ambulatory Visit: Payer: Self-pay | Admitting: Obstetrics and Gynecology

## 2023-04-02 DIAGNOSIS — N952 Postmenopausal atrophic vaginitis: Secondary | ICD-10-CM

## 2023-06-15 ENCOUNTER — Ambulatory Visit (INDEPENDENT_AMBULATORY_CARE_PROVIDER_SITE_OTHER): Payer: BC Managed Care – PPO

## 2023-06-15 ENCOUNTER — Encounter: Payer: Self-pay | Admitting: Podiatry

## 2023-06-15 ENCOUNTER — Ambulatory Visit (INDEPENDENT_AMBULATORY_CARE_PROVIDER_SITE_OTHER): Payer: BC Managed Care – PPO | Admitting: Podiatry

## 2023-06-15 DIAGNOSIS — M7671 Peroneal tendinitis, right leg: Secondary | ICD-10-CM | POA: Diagnosis not present

## 2023-06-15 DIAGNOSIS — M722 Plantar fascial fibromatosis: Secondary | ICD-10-CM

## 2023-06-15 MED ORDER — BETAMETHASONE SOD PHOS & ACET 6 (3-3) MG/ML IJ SUSP
3.0000 mg | Freq: Once | INTRAMUSCULAR | Status: AC
Start: 2023-06-15 — End: 2023-06-15
  Administered 2023-06-15: 3 mg via INTRA_ARTICULAR

## 2023-06-15 NOTE — Progress Notes (Signed)
   Chief Complaint  Patient presents with   Plantar Fasciitis    RM9: PF interested in new orthotics.possible plantars wart starting on left foot, has appointment for fitting @1030  with trish    Subjective: 62 y.o. female presenting today for evaluation of right heel pain.  Patient last seen in the office a few years ago.  She says that orthotics have helped significantly but her orthotics are wearing out.  She would like a new pair of orthotics.  She has been recently experiencing pain and tenderness now up to the right heel and lateral column of the foot.   Past Medical History:  Diagnosis Date   Allergic rhinitis    Anal fissure    Chronic idiopathic constipation    Colon polyps    Endometriosis    Pulled muscle 10/2022     Objective: Physical Exam General: The patient is alert and oriented x3 in no acute distress.  Dermatology: Skin is warm, dry and supple bilateral lower extremities. Negative for open lesions or macerations bilateral.   Vascular: Dorsalis Pedis and Posterior Tibial pulses palpable bilateral.  Capillary fill time is immediate to all digits.  Neurological: Grossly intact via light touch  Musculoskeletal: Tenderness to palpation to the plantar aspect of the right heel along the plantar fascia.  To a lesser extent there is some tenderness along the lateral column of the right foot likely secondary to compensation.  All other joints range of motion within normal limits bilateral. Strength 5/5 in all groups bilateral.   Assessment: 1. Plantar fasciitis right 2.  Insertional peroneal tendinitis/lateral column pain right likely secondary to compensation  Plan of Care:  -Patient evaluated. -Injection of 0.5 cc Celestone Soluspan injected into the plantar fascia right -Continue wearing good supportive shoes and sneakers -Patient declined oral anti-inflammatory.  She says that meloxicam in the past has traditionally made her somewhat groggy -Continue wearing good  supportive tennis shoes and sneakers -Patient has an appointment today with orthotics department for custom orthotics -I explained that I do believe the lateral column pain/peroneal tendinitis should resolve with both the orthotics and resolution of the plantar fasciitis so she quits compensating during gait. -Return to clinic as needed  Felecia Shelling, DPM [facility]  Dr. Felecia Shelling, DPM    [address] [place], [place] [address]                Office [phone]  Fax [phone]

## 2023-06-15 NOTE — Progress Notes (Signed)
  Patient was seen, measured for custom molded foot orthotics patient has Plantar fasciitis and has received an injection today in Right heel  Patient will benefit from CFO's as they will help provide total contact to MLA's helping to better distribute body weight across BIL feet greater reducing plantar pressure and pain and to also encourage FF and RF alignment  Patient was scanned items to be ordered and fit when in   [employer], CFo, CFm

## 2023-06-22 IMAGING — MG MM DIGITAL SCREENING BILAT W/ TOMO AND CAD
8 series · 8 of 24 positions shown · non-contrast
Comparison: Previous exam(s).

CLINICAL DATA: Screening.

EXAM:
DIGITAL SCREENING BILATERAL MAMMOGRAM WITH TOMOSYNTHESIS AND CAD
TECHNIQUE: Bilateral screening digital craniocaudal and mediolateral oblique
mammograms were obtained. Bilateral screening digital breast
tomosynthesis was performed. The images were evaluated with
computer-aided detection.

[L MLO synth-2D]
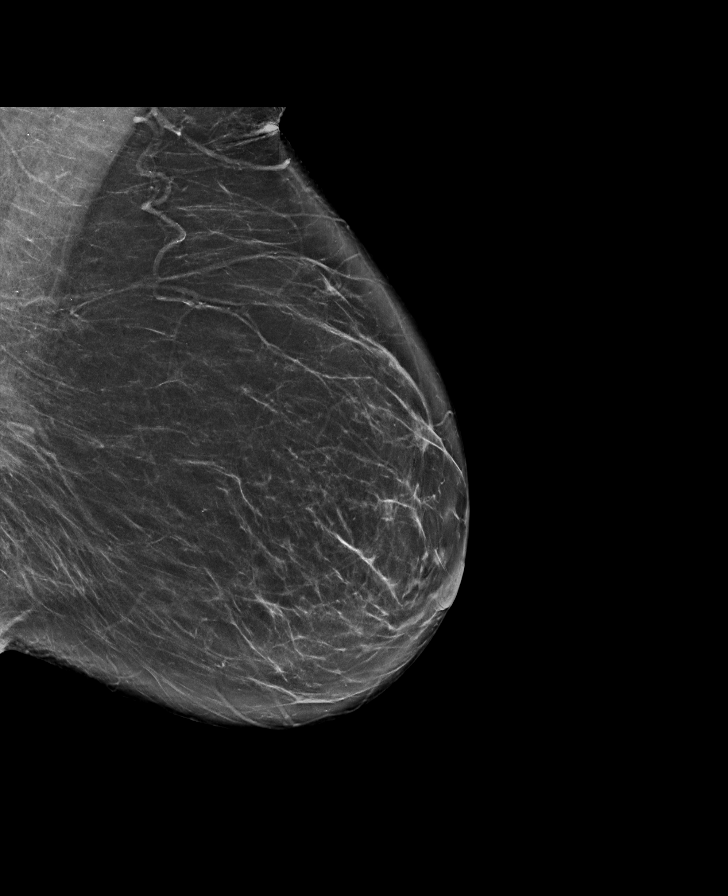

[L CC synth-2D]
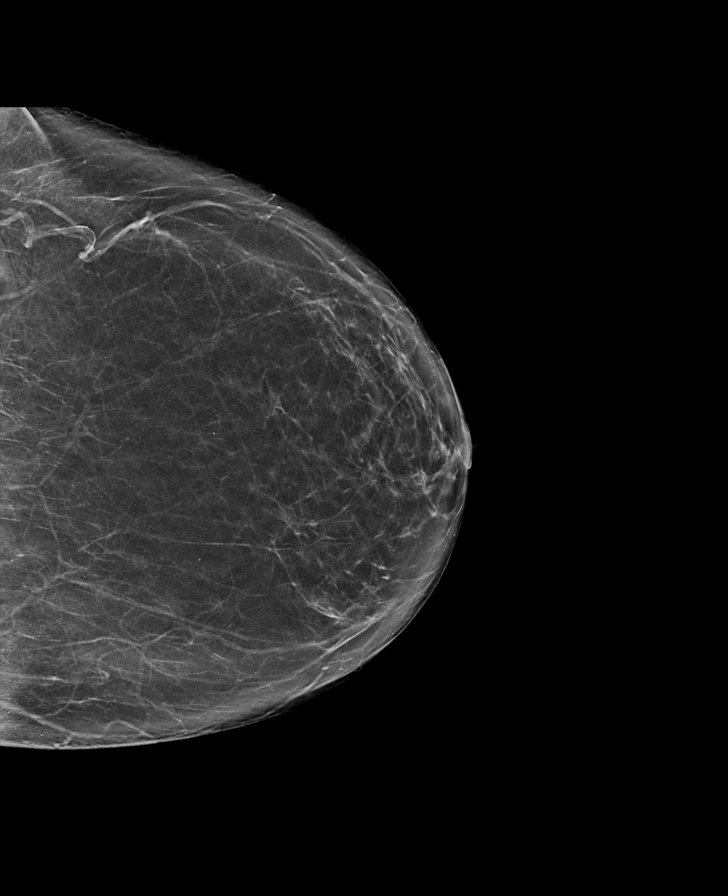

[R CC synth-2D]
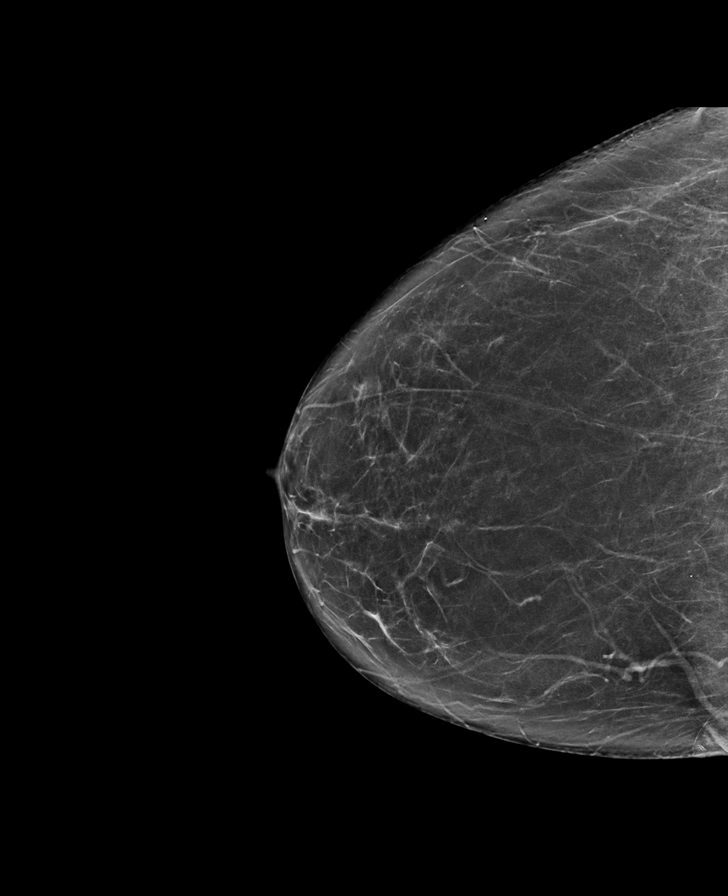

[R MLO synth-2D]
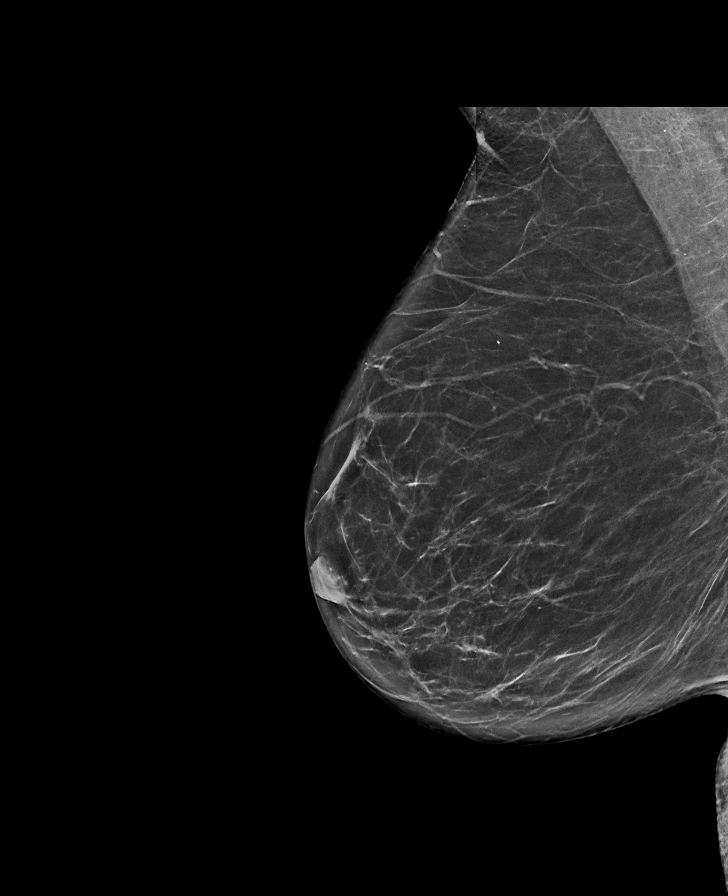

[R CC tomo · tomo slice 36/71.0]
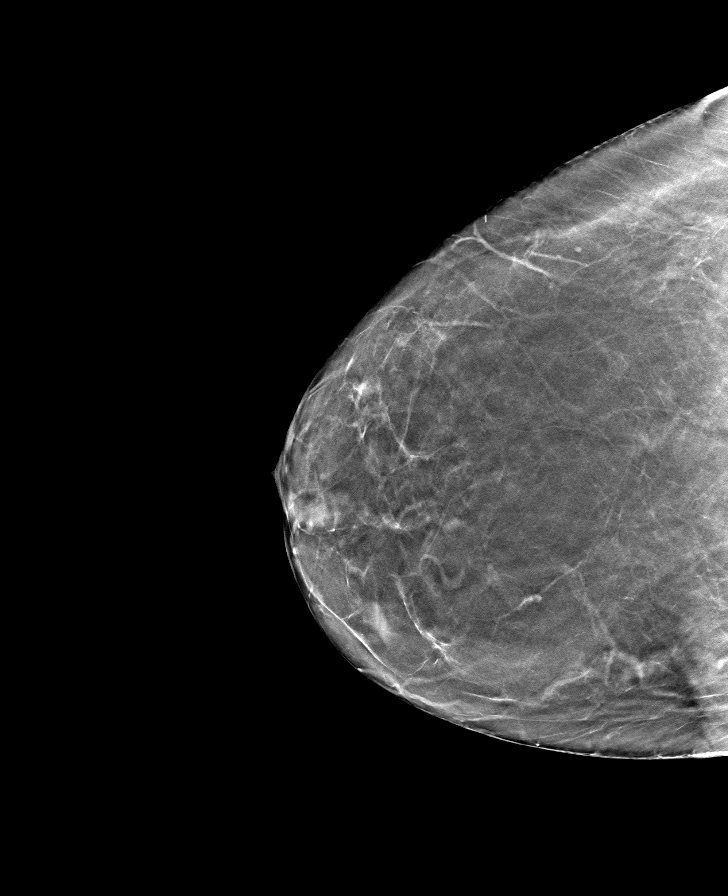

[L CC tomo · tomo slice 38/75.0]
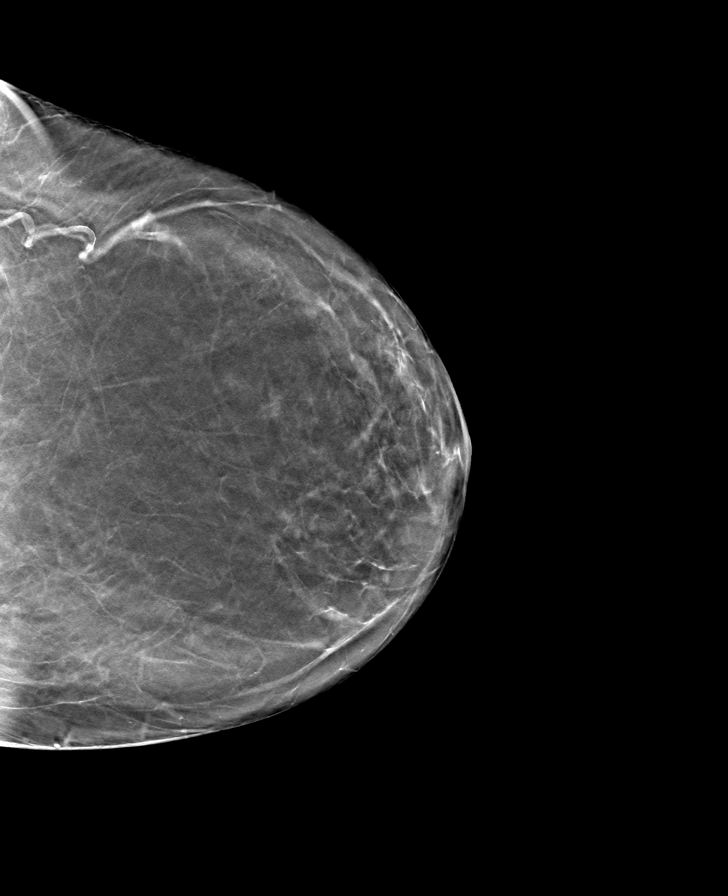

[R MLO tomo · tomo slice 39/78.0]
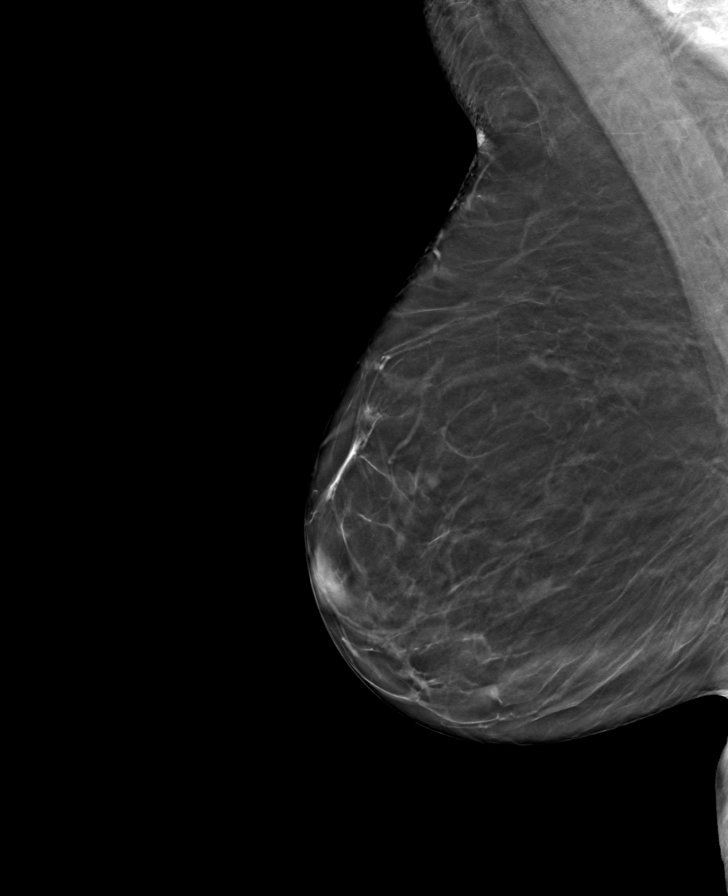

[L MLO tomo · tomo slice 43/86.0]
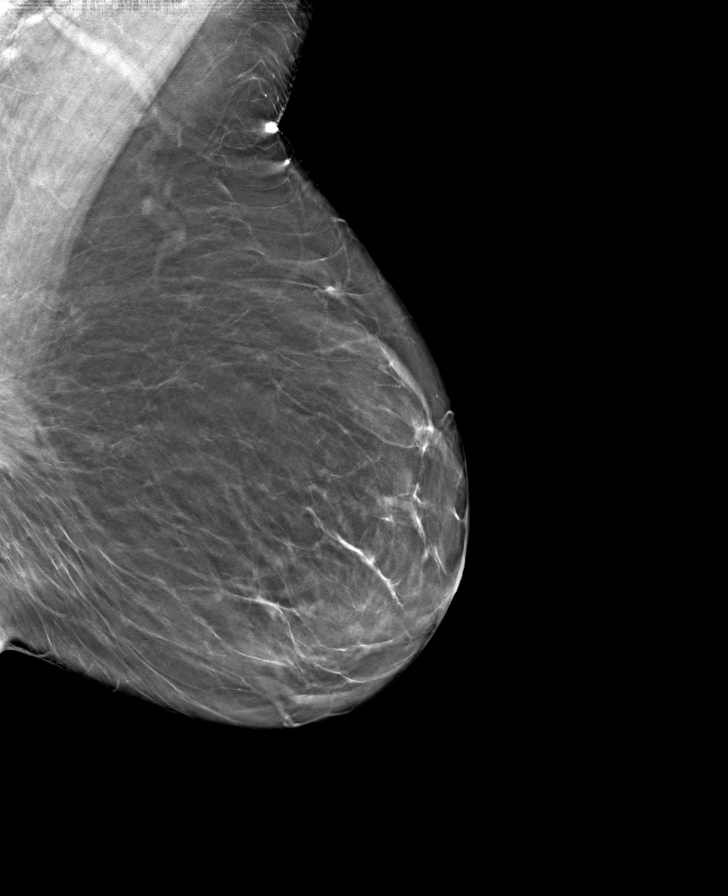

[8 of 24 positions shown; findings below may reference images not displayed]

ACR Breast Density Category b: There are scattered areas of
fibroglandular density.
FINDINGS: There are no findings suspicious for malignancy.
IMPRESSION: No mammographic evidence of malignancy. A result letter of this
screening mammogram will be mailed directly to the patient.

RECOMMENDATION:
Screening mammogram in one year. (Code:51-O-LD2)

BI-RADS CATEGORY  1: Negative.

## 2023-06-29 ENCOUNTER — Telehealth: Payer: Self-pay | Admitting: Podiatry

## 2023-06-29 NOTE — Telephone Encounter (Signed)
Pt calling to check status or orthotics. Would like to schedule an appt with Dr Logan Bores when opicking up

## 2023-07-04 ENCOUNTER — Ambulatory Visit
Admission: RE | Admit: 2023-07-04 | Discharge: 2023-07-04 | Disposition: A | Discharge status: Home / Self Care | Payer: BC Managed Care – PPO | Source: Ambulatory Visit | Attending: Family Medicine | Admitting: Family Medicine

## 2023-07-04 ENCOUNTER — Encounter: Payer: Self-pay | Admitting: Family Medicine

## 2023-07-04 ENCOUNTER — Ambulatory Visit (INDEPENDENT_AMBULATORY_CARE_PROVIDER_SITE_OTHER): Payer: BC Managed Care – PPO | Admitting: Family Medicine

## 2023-07-04 VITALS — BP 102/68 | HR 72 | Temp 97.7°F | Ht 65.0 in | Wt 132.4 lb

## 2023-07-04 DIAGNOSIS — R0789 Other chest pain: Secondary | ICD-10-CM

## 2023-07-04 DIAGNOSIS — R0602 Shortness of breath: Secondary | ICD-10-CM | POA: Insufficient documentation

## 2023-07-04 DIAGNOSIS — R002 Palpitations: Secondary | ICD-10-CM | POA: Insufficient documentation

## 2023-07-04 DIAGNOSIS — Z131 Encounter for screening for diabetes mellitus: Secondary | ICD-10-CM

## 2023-07-04 DIAGNOSIS — R0609 Other forms of dyspnea: Secondary | ICD-10-CM | POA: Insufficient documentation

## 2023-07-04 NOTE — Progress Notes (Signed)
Established Patient Office Visit   Subjective:  Patient ID: Alyssa Cox, female    DOB: 1961-03-14  Age: 62 y.o. MRN: [id number]  Chief Complaint  Patient presents with   Palpitations    Heart palpitations x 3-4 weeks. SOB ans chest pain last week.     Palpitations  Associated symptoms include shortness of breath. Pertinent negatives include no weakness.   Encounter Diagnoses  Name Primary?   Palpitations Yes   Chest tightness    SOB (shortness of breath)    Screening for diabetes mellitus    2 to 3-week history of intermittent palpitations that may last for minutes.  They are coming daily.  There has been some association of chest tightness and shortness of breath.  She did notice this when she was walking 1 day for a friend.  She denies any particular stress.  Normal TSH back in 2023.  Has been consuming 1 glass of wine daily.  1 cup of coffee daily.  Will have last HDL measured was 105.  She started smoking when she was 16 and quit when she was 38.  She has not had a cigarette per 24 years.   Review of Systems  Constitutional: Negative.   HENT: Negative.    Eyes:  Negative for blurred vision, discharge and redness.  Respiratory:  Positive for shortness of breath.   Cardiovascular:  Positive for palpitations.  Gastrointestinal:  Negative for abdominal pain.  Genitourinary: Negative.   Musculoskeletal: Negative.  Negative for myalgias.  Skin:  Negative for rash.  Neurological:  Negative for tingling, loss of consciousness and weakness.  Endo/Heme/Allergies:  Negative for polydipsia.     Current Outpatient Medications:    albuterol (VENTOLIN HFA) 108 (90 Base) MCG/ACT inhaler, Inhale 1-2 puffs into the lungs every 6 (six) hours as needed for wheezing or shortness of breath., Disp: 8 g, Rfl: 2   AMBULATORY NON FORMULARY MEDICATION, Medication Name: Diltiazem 2% with lidocaine 2% cream.  Apply a pea sized amount per rectum 3 times a day [employer], Disp: 30 g,  Rfl: 0   calcium carbonate (OS-CAL - DOSED IN MG OF ELEMENTAL CALCIUM) 1250 (500 Ca) MG tablet, Take 1 tablet by mouth., Disp: , Rfl:    Chlorphen-PE-Acetaminophen 4-10-325 MG TABS, May take 1 every 6 hours as needed., Disp: 30 tablet, Rfl: 0   cholecalciferol (VITAMIN D3) 25 MCG (1000 UNIT) tablet, Take 1,000 Units by mouth daily., Disp: , Rfl:    fluticasone (FLONASE) 50 MCG/ACT nasal spray, SPRAY 2 SPRAYS INTO EACH NOSTRIL EVERY DAY, Disp: 48 mL, Rfl: 2   hydrocortisone 2.5 % ointment, SMARTSIG:sparingly Topical Twice Daily, Disp: , Rfl:    latanoprost (XALATAN) 0.005 % ophthalmic solution, latanoprost 0.005 % eye drops  INSTILL 1 DROP INTO BOTH EYES AT BEDTIME, Disp: , Rfl:    meloxicam (MOBIC) 7.5 MG tablet, Take 1 tablet (7.5 mg total) by mouth daily as needed for pain., Disp: 30 tablet, Rfl: 2   Multiple Vitamins-Minerals (MULTIVITAMIN WOMEN 50+ PO), Take by mouth., Disp: , Rfl:    mupirocin ointment (BACTROBAN) 2 %, Apply a thin coat to papule twice daily for 7 days., Disp: 22 g, Rfl: 0   traMADol (ULTRAM) 50 MG tablet, Take 1-2 tablets (50-100 mg total) by mouth every 6 (six) hours as needed., Disp: 30 tablet, Rfl: 1   VAGIFEM 10 MCG TABS vaginal tablet, PLACE 1 TABLET VAGINALLY 2 TIMES A WEEK., Disp: 24 tablet, Rfl: 3   Objective:  BP 102/68   Pulse 72   Temp 97.7 F (36.5 C)   Ht 5\' 5"  (1.651 m)   Wt 132 lb 6.4 oz (60.1 kg)   SpO2 99%   BMI 22.03 kg/m    Physical Exam Constitutional:      General: She is not in acute distress.    Appearance: Normal appearance. She is not ill-appearing, toxic-appearing or diaphoretic.  HENT:     Head: Normocephalic and atraumatic.     Right Ear: External ear normal.     Left Ear: External ear normal.  Eyes:     General: No scleral icterus.       Right eye: No discharge.        Left eye: No discharge.     Extraocular Movements: Extraocular movements intact.     Conjunctiva/sclera: Conjunctivae normal.  Cardiovascular:     Rate  and Rhythm: Normal rate and regular rhythm.  Pulmonary:     Effort: Pulmonary effort is normal. No respiratory distress.     Breath sounds: Normal breath sounds.  Musculoskeletal:     Cervical back: No rigidity or tenderness.  Lymphadenopathy:     Cervical: No cervical adenopathy.  Skin:    General: Skin is warm and dry.  Neurological:     Mental Status: She is alert and oriented to person, place, and time.  Psychiatric:        Mood and Affect: Mood normal.        Behavior: Behavior normal.      No results found for any visits on 07/04/23.    The ASCVD Risk score (Arnett DK, et al., 2019) failed to calculate for the following reasons:   The valid HDL cholesterol range is 20 to 100 mg/dL    Assessment & Plan:   Palpitations -     EKG 12-Lead -     Troponin I - -     Ambulatory referral to Cardiology -     DG Chest 2 View; Future  Chest tightness -     EKG 12-Lead -     Troponin I - -     Ambulatory referral to Cardiology -     DG Chest 2 View; Future  SOB (shortness of breath) -     EKG 12-Lead -     Troponin I - -     Ambulatory referral to Cardiology -     DG Chest 2 View; Future  Screening for diabetes mellitus    Return in about 6 weeks (around 08/15/2023).  No clear etiology for her pelvic stations.  EKG today showed normal sinus rhythm without ST segment changes.  With the regularity of her palpitation she may be a candidate for loop recorder.  Discussed alcohol being a cardiac irritant.  She has a highly favorable HDL but has been experiencing some shortness of breath and chest pressure.  Mliss Sax, MD

## 2023-07-05 LAB — TROPONIN I: Troponin I: <3 ng/L (ref <=47)

## 2023-07-06 IMAGING — DX DG CHEST 2V
2 series · 2 of 2 positions shown · non-contrast
Comparison: None.

CLINICAL DATA: Lower respiratory infection. Cough with wheezing.
Shortness of breath.

EXAM:
CHEST - 2 VIEW

[chest pa]
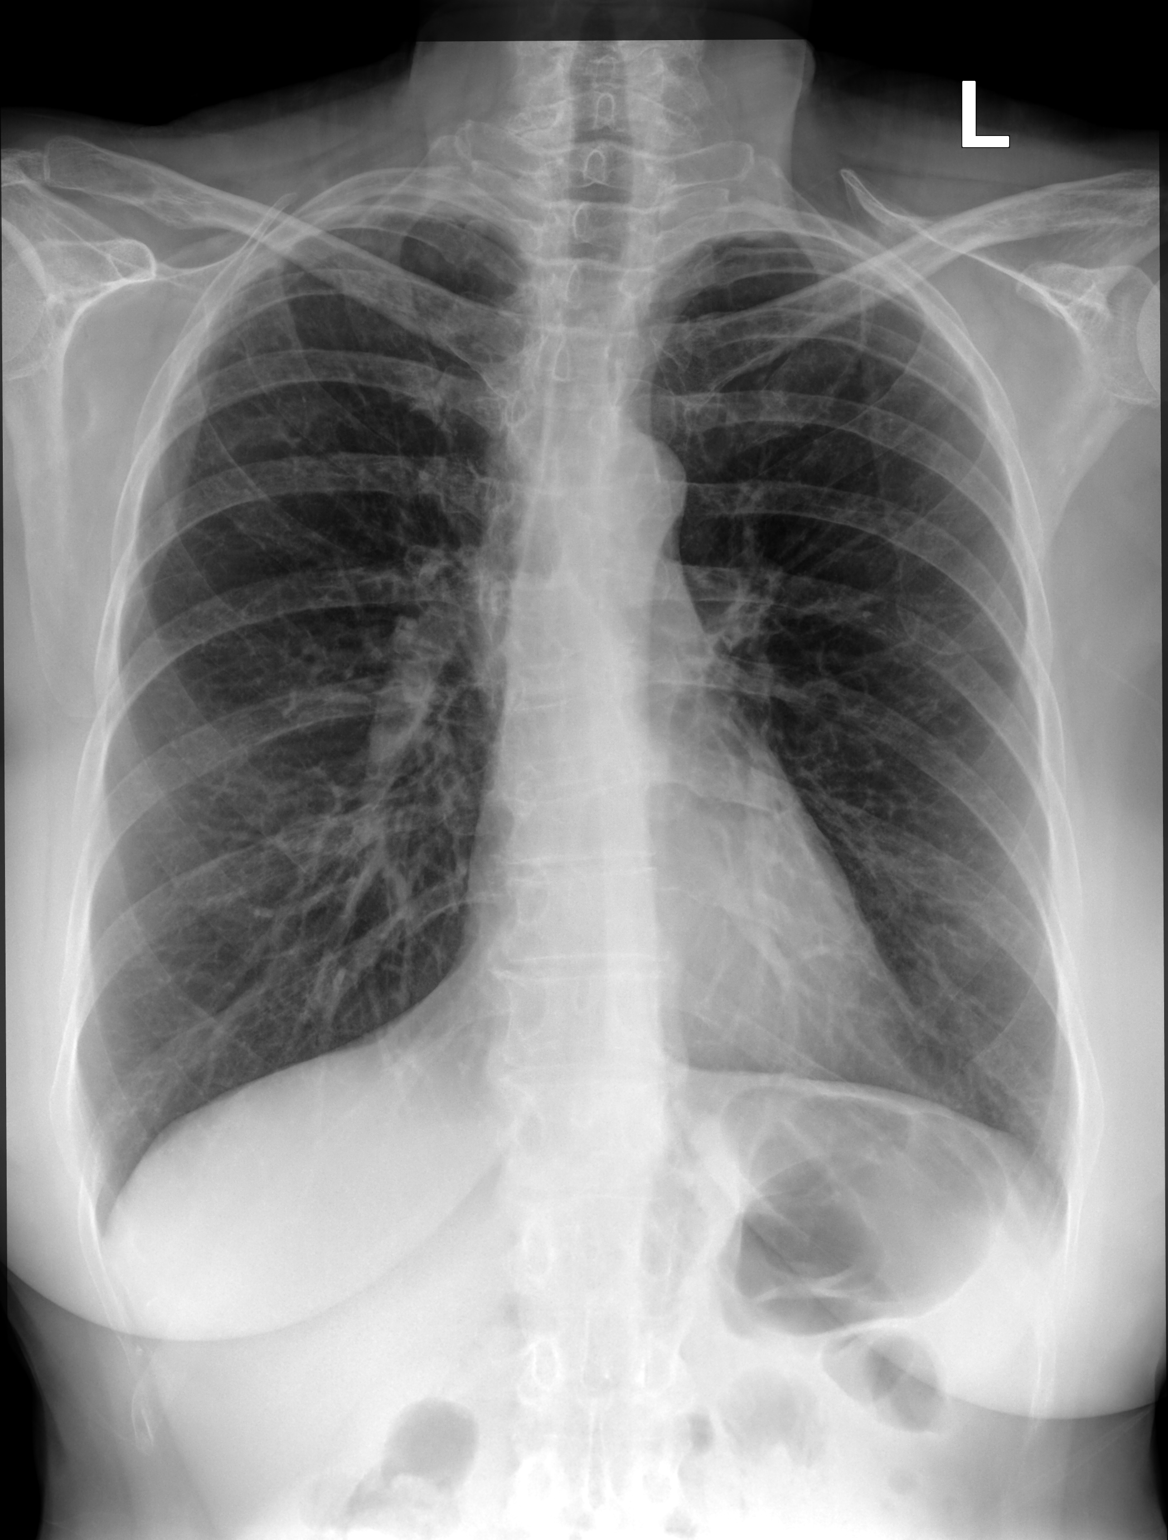

[chest lat]
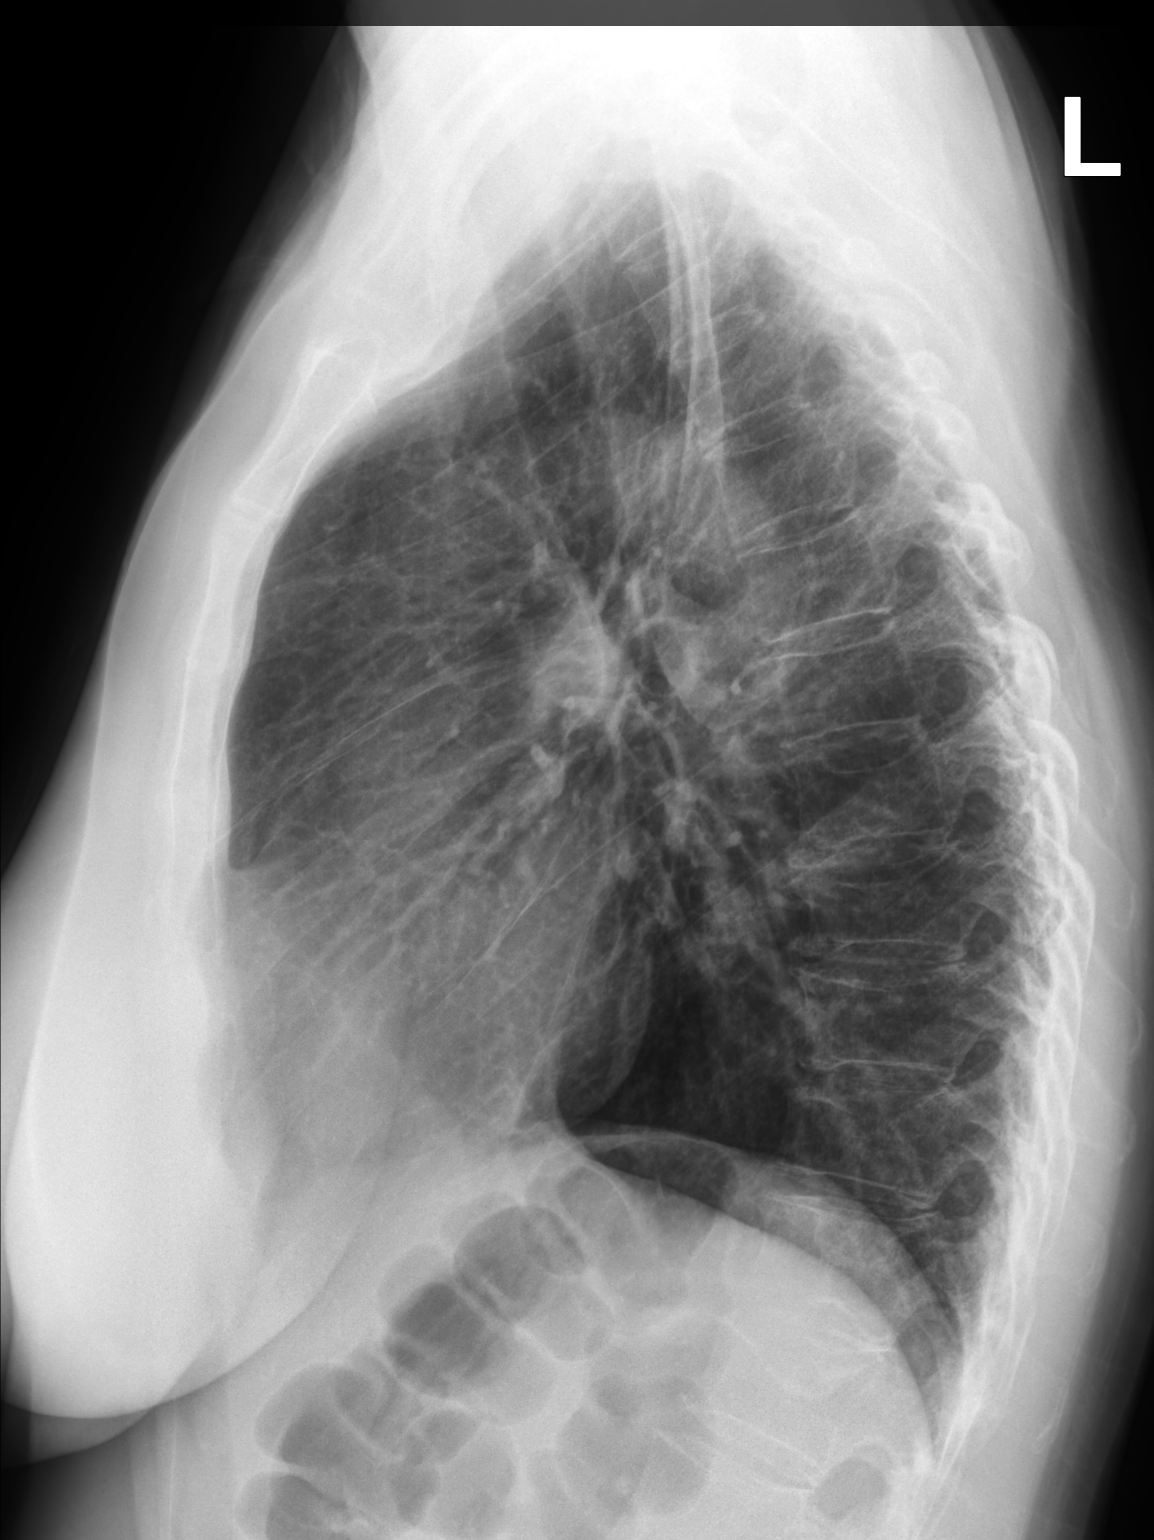

[2 of 2 positions shown; findings below may reference images not displayed]

FINDINGS: Borderline hyperinflation. There is mild biapical pleuroparenchymal
scarring. The heart is normal in size with normal mediastinal
contours. No confluent consolidation. No pulmonary edema, pleural
effusion, or pneumothorax. Slight flattening of a few lower thoracic
vertebra.
IMPRESSION: Borderline hyperinflation which can be seen with bronchitis, asthma,
or smoking related lung disease. No focal airspace disease.

## 2023-07-15 ENCOUNTER — Ambulatory Visit: Payer: BC Managed Care – PPO

## 2023-07-20 NOTE — Progress Notes (Signed)
Patient presents today to pick up custom molded foot orthotics, diagnosed with PF by Dr. Logan Bores.   Orthotics were dispensed and fit was satisfactory. Reviewed instructions for break-in and wear. Written instructions given to patient.  Patient will follow up as needed.   Addison Bailey Cped, CFo, CFm

## 2023-08-09 NOTE — Telephone Encounter (Signed)
Pt called today and want to know if she can change dermatology dr on her own or can she do this her self. Please give patient a call

## 2023-08-15 ENCOUNTER — Telehealth: Payer: Self-pay | Admitting: *Deleted

## 2023-08-15 ENCOUNTER — Ambulatory Visit: Payer: BC Managed Care – PPO | Attending: Cardiology | Admitting: Cardiology

## 2023-08-15 ENCOUNTER — Ambulatory Visit: Payer: BC Managed Care – PPO | Attending: Cardiology

## 2023-08-15 ENCOUNTER — Encounter: Payer: Self-pay | Admitting: Cardiology

## 2023-08-15 VITALS — BP 112/76 | HR 76 | Resp 16 | Ht 65.0 in | Wt 135.2 lb

## 2023-08-15 DIAGNOSIS — R0609 Other forms of dyspnea: Secondary | ICD-10-CM | POA: Diagnosis not present

## 2023-08-15 DIAGNOSIS — R002 Palpitations: Secondary | ICD-10-CM | POA: Diagnosis not present

## 2023-08-15 DIAGNOSIS — Z87891 Personal history of nicotine dependence: Secondary | ICD-10-CM | POA: Diagnosis not present

## 2023-08-15 DIAGNOSIS — Z79899 Other long term (current) drug therapy: Secondary | ICD-10-CM

## 2023-08-15 DIAGNOSIS — R931 Abnormal findings on diagnostic imaging of heart and coronary circulation: Secondary | ICD-10-CM

## 2023-08-15 NOTE — Progress Notes (Signed)
Cardiology Office Note:  .   Date:  08/15/2023  ID:  Alyssa Cox, DOB [birth year], MRN [id number] PCP: Mliss Sax, MD  [facility] Providers Cardiologist:  Truett Mainland, MD PCP: Mliss Sax, MD  Chief Complaint  Patient presents with   Palpitations   New Patient (Initial Visit)      History of Present Illness: .    Alyssa Cox is a 62 y.o. female with palpitations  Patient moved from [place] to [place] in 2022 to be close to her grandkids.  She is retired, but stays active with daily walks 3 miles, working on her house remodeling, as well as taking care of her chicken.  She is a former smoker, drinks 1 cup of coffee and about 2 glasses of wine daily.  She has had complaints of palpitations occurring several times a week, lasting for about 5 minutes at a time, both at rest and exertion, associated with shortness of breath.  She denies any chest pain, presyncope, syncope. Patient is family history of ASCVD, with father having a stroke at 34.   Vitals:   08/15/23 0841  BP: 112/76  Pulse: 76  Resp: 16  SpO2: 98%     ROS:  Review of Systems  Cardiovascular:  Positive for dyspnea on exertion and palpitations. Negative for chest pain, leg swelling and syncope.     Studies Reviewed: Marland Kitchen       EKG 07/04/2023: Sinus rhythm 57 bpm Short PR interval  12/23/2022: Chol 254, TG 78, HDL 105, LDL 133 Rest normal   Physical Exam:   Physical Exam Vitals and nursing note reviewed.  Constitutional:      General: She is not in acute distress. Neck:     Vascular: No JVD.  Cardiovascular:     Rate and Rhythm: Normal rate and regular rhythm.     Heart sounds: Normal heart sounds. No murmur heard. Pulmonary:     Effort: Pulmonary effort is normal.     Breath sounds: Normal breath sounds. No wheezing or rales.  Musculoskeletal:     Right lower leg: No edema.     Left lower leg: No edema.      VISIT DIAGNOSES:   ICD-10-CM    1. Palpitations  R00.2 LONG TERM MONITOR (3-14 DAYS)    2. Encounter for screening for coronary artery disease  Z13.6 CT CARDIAC SCORING (SELF PAY ONLY)    3. Dyspnea, unspecified type  R06.00 ECHOCARDIOGRAM COMPLETE       ASSESSMENT AND PLAN: .    Alyssa Cox is a 62 y.o. female with palpitations  Palpitations: Several episodes of brief palpitation episodes associated with shortness of breath.  Recommend echocardiogram and 2-week ZIO monitor for evaluation.  Discussed vagal maneuvers.  No medication recommended at this time.  Given patient's family history of stroke, personal prior history of smoking, recommend calcium score scan for CAD risk stratification.   No orders of the defined types were placed in this encounter.   F/u as needed, unless any significant abnormalities found on above testing.  Signed, Elder Negus, MD

## 2023-08-15 NOTE — Progress Notes (Unsigned)
Enrolled for [employer] to mail a ZIO XT long term holter monitor to the patients address on file.  

## 2023-08-15 NOTE — Telephone Encounter (Signed)
-----   Message from [place] W sent at 08/15/2023  9:29 AM EDT ----- Regarding: RE: 14 DAY ZIO FOR PALPITATIONS done ----- Message ----- From: Loa Socks, LPN Sent: [id number]   9:02 AM EDT To: Ernst Bowler; Katrina Claria Dice Subject: 14 DAY ZIO FOR PALPITATIONS                    2 week zio ordered for palpitations  Please enroll and let me know when you do?  Thanks [employer]

## 2023-08-15 NOTE — Patient Instructions (Signed)
Medication Instructions:   Your physician recommends that you continue on your current medications as directed. Please refer to the Current Medication list given to you today.  *If you need a refill on your cardiac medications before your next appointment, please call your pharmacy*    Testing/Procedures:  Your physician has requested that you have an echocardiogram. Echocardiography is a painless test that uses sound waves to create images of your heart. It provides your doctor with information about the size and shape of your heart and how well your heart's chambers and valves are working. This procedure takes approximately one hour. There are no restrictions for this procedure. Please do NOT wear cologne, perfume, aftershave, or lotions (deodorant is allowed). Please arrive 15 minutes prior to your appointment time.    CARDIAC CALCIUM SCORE (SELF PAY)    ZIO XT- Long Term Monitor Instructions  Your physician has requested you wear a ZIO patch monitor for 14 days.  This is a single patch monitor. [employer] supplies one patch monitor per enrollment. Additional stickers are not available. Please do not apply patch if you will be having a Nuclear Stress Test,  Echocardiogram, Cardiac CT, MRI, or Chest Xray during the period you would be wearing the  monitor. The patch cannot be worn during these tests. You cannot remove and re-apply the  ZIO XT patch monitor.  Your ZIO patch monitor will be mailed 3 day USPS to your address on file. It may take 3-5 days  to receive your monitor after you have been enrolled.  Once you have received your monitor, please review the enclosed instructions. Your monitor  has already been registered assigning a specific monitor serial # to you.  Billing and Patient Assistance Program Information  We have supplied [employer] with any of your insurance information on file for billing purposes. [employer] offers a sliding scale Patient Assistance Program for  patients that do not have  insurance, or whose insurance does not completely cover the cost of the ZIO monitor.  You must apply for the Patient Assistance Program to qualify for this discounted rate.  To apply, please call [employer] at [phone], select option 4, select option 2, ask to apply for  Patient Assistance Program. Meredeth Ide will ask your household income, and how many people  are in your household. They will quote your out-of-pocket cost based on that information.  [employer] will also be able to set up a 75-month, interest-free payment plan if needed.  Applying the monitor   Shave hair from upper left chest.  Hold abrader disc by orange tab. Rub abrader in 40 strokes over the upper left chest as  indicated in your monitor instructions.  Clean area with 4 enclosed alcohol pads. Let dry.  Apply patch as indicated in monitor instructions. Patch will be placed under collarbone on left  side of chest with arrow pointing upward.  Rub patch adhesive wings for 2 minutes. Remove white label marked "1". Remove the white  label marked "2". Rub patch adhesive wings for 2 additional minutes.  While looking in a mirror, press and release button in center of patch. A small green light will  flash 3-4 times. This will be your only indicator that the monitor has been turned on.  Do not shower for the first 24 hours. You may shower after the first 24 hours.  Press the button if you feel a symptom. You will hear a small click. Record Date, Time and  Symptom in the Patient Logbook.  When  you are ready to remove the patch, follow instructions on the last 2 pages of Patient  Logbook. Stick patch monitor onto the last page of Patient Logbook.  Place Patient Logbook in the blue and white box. Use locking tab on box and tape box closed  securely. The blue and white box has prepaid postage on it. Please place it in the mailbox as  soon as possible. Your physician should have your test results approximately  7 days after the  monitor has been mailed back to [facility].  Call [facility] Customer Care at [phone] if you have questions regarding  your ZIO XT patch monitor. Call them immediately if you see an orange light blinking on your  monitor.  If your monitor falls off in less than 4 days, contact our Monitor department at [phone].  If your monitor becomes loose or falls off after 4 days call [employer] at [phone] for  suggestions on securing your monitor    Follow-Up:  AS NEEDED WITH DR. PATWARDHAN

## 2023-08-16 ENCOUNTER — Ambulatory Visit (HOSPITAL_BASED_OUTPATIENT_CLINIC_OR_DEPARTMENT_OTHER)
Admission: RE | Admit: 2023-08-16 | Discharge: 2023-08-16 | Disposition: A | Discharge status: Home / Self Care | Payer: Self-pay | Source: Ambulatory Visit | Attending: Cardiology | Admitting: Cardiology

## 2023-08-16 DIAGNOSIS — Z87891 Personal history of nicotine dependence: Secondary | ICD-10-CM | POA: Insufficient documentation

## 2023-08-19 DIAGNOSIS — R002 Palpitations: Secondary | ICD-10-CM | POA: Diagnosis not present

## 2023-08-19 NOTE — Progress Notes (Signed)
Mild to moderate amount of calcium in one artery, unlikely to be causing any severe narrowing (given absence of symptoms of chest pain). She already lives fairly healthy lifestyle. Recommend adding Crestor 10 mg daily. Repeat lipid panel in 3 months.  Thanks MJP

## 2023-08-19 NOTE — Telephone Encounter (Signed)
Pt is returning call to nurse. Pt requesting a return call.

## 2023-08-24 ENCOUNTER — Encounter: Payer: Self-pay | Admitting: Internal Medicine

## 2023-08-24 ENCOUNTER — Ambulatory Visit: Payer: BC Managed Care – PPO | Admitting: Internal Medicine

## 2023-08-24 VITALS — BP 120/80 | HR 73 | Temp 98.0°F | Ht 65.0 in | Wt 134.0 lb

## 2023-08-24 DIAGNOSIS — J00 Acute nasopharyngitis [common cold]: Secondary | ICD-10-CM | POA: Diagnosis not present

## 2023-08-24 LAB — POCT INFLUENZA A/B
Influenza A, POC: NEGATIVE
Influenza B, POC: NEGATIVE

## 2023-08-24 LAB — POC COVID19 BINAXNOW: SARS Coronavirus 2 Ag: NEGATIVE

## 2023-08-24 LAB — POCT RAPID STREP A (OFFICE): Rapid Strep A Screen: NEGATIVE

## 2023-08-24 MED ORDER — ROSUVASTATIN CALCIUM 10 MG PO TABS: 10.0000 mg | ORAL_TABLET | Freq: Every day | ORAL | 3 refills | Status: DC

## 2023-08-24 NOTE — Progress Notes (Signed)
[facility] [address] [place] [address] Dept: [phone] Dept Fax: [phone]  Acute Care Office Visit  Subjective:   Alyssa Cox 1961-07-03 08/24/2023  Chief Complaint  Patient presents with   Sore Throat    Started Monday night  Painful to swallow     HPI: Discussed the use of AI scribe software for clinical note transcription with the patient, who gave verbal consent to proceed.  History of Present Illness   The patient, with a history of allergies, presents with a sore throat and body aches that started late Monday night. She describes the throat pain as severe, making it painful to swallow. She also reports a runny nose and a dry cough, which she attributes to postnasal drip. She denies fever, chills, earache, chest pain, and shortness of breath. She has been using Flonase as needed for her allergies. She had close contact with a COVID-19 positive individual and attended a crowded event recently. She tested negative for COVID-19 at home this morning. . She has been adhering to COVID-19 and flu vaccination schedules. She also reports palpitations, for which she is currently wearing a heart monitor for two weeks.         The following portions of the patient's history were reviewed and updated as appropriate: past medical history, past surgical history, family history, social history, allergies, medications, and problem list.   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 11/19/2021   Vaginal atrophy 11/19/2021   Glaucoma 09/23/2021   Encounter for screening for coronary artery disease 09/23/2021   Plantar fasciitis 09/23/2021   Need for shingles vaccine 09/23/2021   Past Medical History:  Diagnosis Date   Allergic rhinitis    Anal fissure    Chronic idiopathic constipation    Colon polyps    Endometriosis    Pulled muscle 10/2022   Past Surgical History:  Procedure Laterality Date   ABDOMINAL HYSTERECTOMY     ANAL FISSURE REPAIR N/A 01/04/2023   Procedure: ANAL DILATION;  Surgeon: Romie Levee, MD;  Location: [facility];  Service: General;  Laterality: N/A;   CESAREAN SECTION     HEMORRHOID SURGERY     SPHINCTEROTOMY N/A 01/04/2023   Procedure: CHEMICAL SPHINCTEROTOMY;  Surgeon: Romie Levee, MD;  Location: [facility];  Service: General;  Laterality: N/A;   VASCULAR SURGERY     Family History  Problem Relation Age of Onset   Ovarian cancer Mother        mets to eye   Hypertension Mother    Stroke Father    Hypertension Father    Hypertension Brother    Anuerysm Maternal Grandmother    Colon cancer Maternal Grandfather    Stroke Maternal Grandfather    Asthma Daughter    Asthma Son    Breast cancer Neg Hx    Esophageal cancer Neg Hx    Rectal cancer Neg Hx    Stomach cancer Neg Hx     Current Outpatient Medications:  albuterol (VENTOLIN HFA) 108 (90 Base) MCG/ACT inhaler, Inhale 1-2 puffs into the lungs every 6 (six) hours as needed for wheezing or shortness of breath., Disp: 8 g, Rfl: 2   AMBULATORY NON FORMULARY MEDICATION, Medication Name: Diltiazem 2% with lidocaine 2% cream.  Apply a pea sized amount per rectum 3 times a day [employer], Disp: 30 g, Rfl: 0   Chlorphen-PE-Acetaminophen 4-10-325 MG TABS, May take 1 every 6 hours as needed., Disp: 30 tablet, Rfl: 0   cholecalciferol (VITAMIN D3) 25 MCG (1000 UNIT) tablet, Take 1,000 Units by  mouth daily., Disp: , Rfl:    fluticasone (FLONASE) 50 MCG/ACT nasal spray, SPRAY 2 SPRAYS INTO EACH NOSTRIL EVERY DAY, Disp: 48 mL, Rfl: 2   hydrocortisone 2.5 % ointment, SMARTSIG:sparingly Topical Twice Daily, Disp: , Rfl:    latanoprost (XALATAN) 0.005 % ophthalmic solution, latanoprost 0.005 % eye drops  INSTILL 1 DROP INTO BOTH EYES AT BEDTIME, Disp: , Rfl:    meloxicam (MOBIC) 7.5 MG tablet, Take 1 tablet (7.5 mg total) by mouth daily as needed for pain., Disp: 30 tablet, Rfl: 2   Multiple Vitamins-Minerals (MULTIVITAMIN WOMEN 50+ PO), Take by mouth., Disp: , Rfl:    mupirocin ointment (BACTROBAN) 2 %, Apply a thin coat to papule twice daily for 7 days. (Patient taking differently: as needed. Apply a thin coat to papule twice daily for 7 days.), Disp: 22 g, Rfl: 0   rosuvastatin (CRESTOR) 10 MG tablet, Take 1 tablet (10 mg total) by mouth daily., Disp: 90 tablet, Rfl: 3   VAGIFEM 10 MCG TABS vaginal tablet, PLACE 1 TABLET VAGINALLY 2 TIMES A WEEK., Disp: 24 tablet, Rfl: 3   calcium carbonate (OS-CAL - DOSED IN MG OF ELEMENTAL CALCIUM) 1250 (500 Ca) MG tablet, Take 1 tablet by mouth., Disp: , Rfl:  Allergies  Allergen Reactions   Morphine Itching     ROS: A complete ROS was performed with pertinent positives/negatives noted in the HPI. The remainder of the ROS are negative.    Objective:   Today's Vitals   08/24/23 1016  BP: 120/80  Pulse: 73  Temp: 98 F (36.7 C)  TempSrc: Temporal  SpO2: 99%  Weight: 134 lb (60.8 kg)  Height: 5\' 5"  (1.651 m)    GENERAL: Well-appearing, in NAD. Well nourished.  SKIN: Pink, warm and dry. No rash, lesion, ulceration, or ecchymoses HEENT:    HEAD: Normocephalic, non-traumatic.  EYES: Conjunctive pink without exudate. PERRL, EOMI.  EARS: External ear w/o redness, swelling, masses, or lesions. EAC clear. TM's intact, translucent w/o bulging, appropriate landmarks visualized.  NOSE: Septum midline w/o deformity. Nares patent, mucosa pink and  inflamed with clear drainage. No sinus tenderness.  THROAT: Uvula midline. Oropharynx with clear PND.  Tonsils non-inflamed w/o exudate. Mucus membranes pink and moist.  NECK: Trachea midline. Full ROM w/o pain or tenderness. No lymphadenopathy.  RESPIRATORY: Chest wall symmetrical. Respirations even and non-labored. Breath sounds clear to auscultation bilaterally.  CARDIAC: S1, S2 present, regular rate and rhythm. Peripheral pulses 2+ bilaterally.  EXTREMITIES: Without clubbing, cyanosis, or edema.  NEUROLOGIC: Steady, even gait.  PSYCH/MENTAL STATUS: Alert, oriented x 3. Cooperative, appropriate mood and affect.    Results for orders placed or performed in visit on 08/24/23  POC COVID-19 BinaxNow  Result Value Ref Range   SARS Coronavirus 2 Ag Negative Negative  POCT Influenza A/B  Result Value Ref Range   Influenza A, POC Negative Negative   Influenza B, POC Negative Negative  POCT rapid  strep A  Result Value Ref Range   Rapid Strep A Screen Negative Negative      Assessment & Plan:  Assessment and Plan    Upper Respiratory Infection Sore throat, nasal congestion, and cough since Monday. No fever, chills, chest pain, or shortness of breath. COVID, flu, and strep tests were negative. -Continue Flonase as needed. -Consider Mucinex for cough due to postnasal drip. -Continue Allegra D. - consider throat lozenges, salt water gargles, and chloraseptic spray for sore throat -Stay hydrated and consider Tylenol for body aches. -Repeat COVID test in two days (Friday).     No orders of the defined types were placed in this encounter.  Orders Placed This Encounter  Procedures   POC COVID-19 BinaxNow    Order Specific Question:   Previously tested for COVID-19    Answer:   Unknown    Order Specific Question:   Resident in a congregate (group) care setting    Answer:   Unknown    Order Specific Question:   Employed in healthcare setting    Answer:   Unknown    Order Specific  Question:   Pregnant    Answer:   Unknown   POCT Influenza A/B   POCT rapid strep A   Lab Orders         POC COVID-19 BinaxNow         POCT Influenza A/B         POCT rapid strep A     No images are attached to the encounter or orders placed in the encounter.  Return if symptoms worsen or fail to improve.   Salvatore Decent, FNP

## 2023-08-24 NOTE — Addendum Note (Signed)
Addended by: Franchot Gallo on: 08/24/2023 08:35 AM   Modules accepted: Orders

## 2023-08-24 NOTE — Telephone Encounter (Signed)
Spoke with patient, she states she had called Friday 11/1 to return a call from our office about her calcium score results.  Per Dr. Rosemary Holms: Mild to moderate amount of calcium in one artery, unlikely to be causing any severe narrowing (given absence of symptoms of chest pain). She already lives fairly healthy lifestyle. Recommend adding Crestor 10 mg daily. Repeat lipid panel in 3 months.   Thanks MJP  Rosuvastatin 10 mg daily sent to [facility] in [place] per Pt request.  Lipid panel ordered. Pt to go to any [facility] office for fasting labs in February 2025.  Patient verbalized understanding of the above and expressed appreciation for call.

## 2023-08-24 NOTE — Patient Instructions (Addendum)
Repeat COVID test in 2 days , call office if positive.   Rest, drink plenty of fluids.  Tylenol for fever, body aches.   For cough: Take Mucinex DM or Robitussin-DM over-the-counter.  Follow the instructions in the box.  For nasal and sinus congestion: Use over-the-counter Flonase: 2 nasal sprays on each side of the nose in the morning until you feel better  Allegra-D once daily  For Sore throat:  Throat lozenges Salt water gargles  Chloraseptic spray   Call if not gradually better over the next  10 days  Call anytime if the symptoms are severe, you have high fever, short of breath, chest pain

## 2023-08-26 ENCOUNTER — Telehealth: Payer: Self-pay | Admitting: Family Medicine

## 2023-08-26 DIAGNOSIS — J069 Acute upper respiratory infection, unspecified: Secondary | ICD-10-CM

## 2023-08-26 MED ORDER — AZITHROMYCIN 250 MG PO TABS: ORAL_TABLET | ORAL | 0 refills | Status: AC

## 2023-08-26 NOTE — Addendum Note (Signed)
Addended by: Salvatore Decent on: 08/26/2023 08:54 AM   Modules accepted: Orders

## 2023-08-26 NOTE — Telephone Encounter (Signed)
Pt was seen by Morrie Sheldon on 08/24/23 for a sore throat, she was asked to cb if not feeling any better. She has green flymm, losing her voice and she feels it's moving into her chest. Please advise pt at [phone]

## 2023-08-26 NOTE — Telephone Encounter (Signed)
Please advise 

## 2023-08-26 NOTE — Telephone Encounter (Signed)
Patient informed of message  

## 2023-08-31 ENCOUNTER — Other Ambulatory Visit: Payer: Self-pay | Admitting: Internal Medicine

## 2023-08-31 ENCOUNTER — Ambulatory Visit (HOSPITAL_BASED_OUTPATIENT_CLINIC_OR_DEPARTMENT_OTHER)
Admission: RE | Admit: 2023-08-31 | Discharge: 2023-08-31 | Disposition: A | Discharge status: Home / Self Care | Payer: BC Managed Care – PPO | Source: Ambulatory Visit | Attending: Internal Medicine | Admitting: Internal Medicine

## 2023-08-31 ENCOUNTER — Ambulatory Visit: Payer: BC Managed Care – PPO | Admitting: Internal Medicine

## 2023-08-31 ENCOUNTER — Encounter: Payer: Self-pay | Admitting: Internal Medicine

## 2023-08-31 VITALS — BP 110/80 | HR 87 | Temp 97.6°F | Ht 65.0 in | Wt 134.0 lb

## 2023-08-31 DIAGNOSIS — J989 Respiratory disorder, unspecified: Secondary | ICD-10-CM

## 2023-08-31 LAB — POC COVID19 BINAXNOW: SARS Coronavirus 2 Ag: NEGATIVE

## 2023-08-31 MED ORDER — FLUCONAZOLE 150 MG PO TABS: ORAL_TABLET | ORAL | 0 refills | Status: DC

## 2023-08-31 MED ORDER — LEVOFLOXACIN 750 MG PO TABS: 750.0000 mg | ORAL_TABLET | Freq: Every day | ORAL | 0 refills | Status: DC

## 2023-08-31 NOTE — Progress Notes (Signed)
[facility] [address] [place] [address] Dept: [phone] Dept Fax: [phone]  Acute Care Office Visit  Subjective:   Alyssa Cox Oct 01, 1961 08/31/2023  Chief Complaint  Patient presents with   Follow-up    Fatigue, SOB    HPI: Discussed the use of AI scribe software for clinical note transcription with the patient, who gave verbal consent to proceed.  History of Present Illness   The patient, with a history of upper respiratory symptoms, presents with persistent and worsening productive cough. Also reports now experiencing significant fatigue, exertional dyspnea, and decreased appetite.  Patient was seen by this provider on 08/24/2023 for upper respiratory symptoms that had begun 2 days ago prior to initial visit.  During visit, patient was tested for COVID 19, influenza, and strep, all which were negative.  Patient was instructed to use over-the-counter medications for symptom management of URI.  On 08/26/2023, patient called PCP office stating symptoms had progressed from upper respiratory and moved down into her chest with a productive cough.  I sent in a Z-Pak at that time and instructed patient to use Mucinex.  Patient was compliant with Z-Pak and states she noticed some improvement (not complete improvement) in her cough, however it has now returned and continued to worsen over this past week. She describes her breath as 'heavy' and notes that she feels 'winded' after climbing stairs. She also reports intermittent throat pain and a tingling sensation in her ear. She has been taking Mucinex and Allegra D to manage her symptoms and reports coughing up phlegm primarily in the morning. She denies chest pain, describing the sensation as 'heavy.'  She is currently wearing a Zio patch for palpitations, this is managed with cardiology currently.  She denies any worsening or frequent palpitations recently.    The following  portions of the patient's history were reviewed and updated as appropriate: past medical history, past surgical history, family history, social history, allergies, medications, and problem list.   Patient Active Problem List   Diagnosis Date Noted   Palpitations 07/04/2023   Chest tightness 07/04/2023   Dyspnea on exertion 07/04/2023   Laryngitis 02/15/2023   Lumbar radiculopathy 11/18/2022   Leukopenia 06/22/2022   Sciatica of right side 06/22/2022   Flu vaccine need 06/22/2022   Allergic rhinitis 12/17/2021   Other fatigue 12/17/2021   Allergic reaction 11/26/2021   Xerosis cutis 11/26/2021   Gynecologic exam normal 11/19/2021   History of total abdominal hysterectomy and bilateral salpingo-oophorectomy 11/19/2021   Vaginal atrophy 11/19/2021   Glaucoma 09/23/2021   Encounter for screening for coronary artery disease 09/23/2021   Plantar fasciitis 09/23/2021   Need for shingles vaccine 09/23/2021   Past Medical History:  Diagnosis Date   Allergic rhinitis    Anal fissure    Chronic idiopathic constipation    Colon polyps    Endometriosis    Pulled muscle 10/2022   Past Surgical History:  Procedure Laterality Date   ABDOMINAL HYSTERECTOMY     ANAL FISSURE REPAIR N/A 01/04/2023   Procedure: ANAL DILATION;  Surgeon: Romie Levee, MD;  Location: [facility];  Service: General;  Laterality: N/A;   CESAREAN SECTION     HEMORRHOID SURGERY     SPHINCTEROTOMY N/A 01/04/2023   Procedure: CHEMICAL SPHINCTEROTOMY;  Surgeon: Romie Levee, MD;  Location: [facility];  Service: General;  Laterality: N/A;   VASCULAR SURGERY     Family History  Problem Relation Age of Onset  Ovarian cancer Mother        mets to eye   Hypertension Mother    Stroke Father    Hypertension Father    Hypertension Brother    Anuerysm Maternal Grandmother    Colon cancer Maternal Grandfather    Stroke Maternal Grandfather    Asthma Daughter    Asthma Son    Breast  cancer Neg Hx    Esophageal cancer Neg Hx    Rectal cancer Neg Hx    Stomach cancer Neg Hx     Current Outpatient Medications:    albuterol (VENTOLIN HFA) 108 (90 Base) MCG/ACT inhaler, Inhale 1-2 puffs into the lungs every 6 (six) hours as needed for wheezing or shortness of breath., Disp: 8 g, Rfl: 2   AMBULATORY NON FORMULARY MEDICATION, Medication Name: Diltiazem 2% with lidocaine 2% cream.  Apply a pea sized amount per rectum 3 times a day [facility], Disp: 30 g, Rfl: 0   calcium carbonate (OS-CAL - DOSED IN MG OF ELEMENTAL CALCIUM) 1250 (500 Ca) MG tablet, Take 1 tablet by mouth., Disp: , Rfl:    Chlorphen-PE-Acetaminophen 4-10-325 MG TABS, May take 1 every 6 hours as needed., Disp: 30 tablet, Rfl: 0   cholecalciferol (VITAMIN D3) 25 MCG (1000 UNIT) tablet, Take 1,000 Units by mouth daily., Disp: , Rfl:    fluconazole (DIFLUCAN) 150 MG tablet, Take 1 tablet by mouth once. Repeat dose in 3 days if symptoms persist., Disp: 2 tablet, Rfl: 0   fluticasone (FLONASE) 50 MCG/ACT nasal spray, SPRAY 2 SPRAYS INTO EACH NOSTRIL EVERY DAY, Disp: 48 mL, Rfl: 2   hydrocortisone 2.5 % ointment, SMARTSIG:sparingly Topical Twice Daily, Disp: , Rfl:    latanoprost (XALATAN) 0.005 % ophthalmic solution, latanoprost 0.005 % eye drops  INSTILL 1 DROP INTO BOTH EYES AT BEDTIME, Disp: , Rfl:    levofloxacin (LEVAQUIN) 750 MG tablet, Take 1 tablet (750 mg total) by mouth daily for 7 days., Disp: 5 tablet, Rfl: 0   meloxicam (MOBIC) 7.5 MG tablet, Take 1 tablet (7.5 mg total) by mouth daily as needed for pain., Disp: 30 tablet, Rfl: 2   Multiple Vitamins-Minerals (MULTIVITAMIN WOMEN 50+ PO), Take by mouth., Disp: , Rfl:    mupirocin ointment (BACTROBAN) 2 %, Apply a thin coat to papule twice daily for 7 days. (Patient taking differently: as needed. Apply a thin coat to papule twice daily for 7 days.), Disp: 22 g, Rfl: 0   rosuvastatin (CRESTOR) 10 MG tablet, Take 1 tablet (10 mg total) by mouth daily.,  Disp: 90 tablet, Rfl: 3   VAGIFEM 10 MCG TABS vaginal tablet, PLACE 1 TABLET VAGINALLY 2 TIMES A WEEK., Disp: 24 tablet, Rfl: 3 Allergies  Allergen Reactions   Morphine Itching     ROS: A complete ROS was performed with pertinent positives/negatives noted in the HPI. The remainder of the ROS are negative.    Objective:   Today's Vitals   08/31/23 1416  BP: 110/80  Pulse: 87  Temp: 97.6 F (36.4 C)  TempSrc: Temporal  SpO2: 98%  Weight: 134 lb (60.8 kg)  Height: 5\' 5"  (1.651 m)    GENERAL: ill-appearing, in NAD. Well nourished.  SKIN: Pink, warm and dry. No rash. NECK: Trachea midline. Full ROM w/o pain or tenderness. No lymphadenopathy.  RESPIRATORY: Chest wall symmetrical. Respirations even and non-labored. Diminished RLL. (+) cough CARDIAC: S1, S2 present, regular rate and rhythm. Peripheral pulses 2+ bilaterally.  EXTREMITIES: Without clubbing, cyanosis, or edema.  NEUROLOGIC: Steady,  even gait.  PSYCH/MENTAL STATUS: Alert, oriented x 3. Cooperative, appropriate mood and affect.     Results for orders placed or performed in visit on 08/31/23  POC COVID-19  Result Value Ref Range   SARS Coronavirus 2 Ag Negative Negative      Assessment & Plan:  Assessment and Plan    Respiratory Illness Progressive and worsening symptoms since onset on 08/22/2023.  Minimal improvement with azithromycin.  Worsening cough, and now fatigue, dyspnea with exertion, and decreased appetite.  Concern is for possible pneumonia -Order chest x-ray to evaluate for pneumonia. -Levaquin 750 p.o. daily x 5 days -Reswab for COVID-19 given the persistence of symptoms and fatigue.  History of yeast infection with antibiotics, prescribe prophylactic antifungal medication with the new antibiotic course.      Meds ordered this encounter  Medications   fluconazole (DIFLUCAN) 150 MG tablet    Sig: Take 1 tablet by mouth once. Repeat dose in 3 days if symptoms persist.    Dispense:  2 tablet     Refill:  0    Order Specific Question:   Supervising Provider    Answer:   Garnette Gunner [[id number]]   levofloxacin (LEVAQUIN) 750 MG tablet    Sig: Take 1 tablet (750 mg total) by mouth daily for 7 days.    Dispense:  5 tablet    Refill:  0    Order Specific Question:   Supervising Provider    Answer:   Garnette Gunner [[id number]]   Orders Placed This Encounter  Procedures   DG Chest 2 View    Standing Status:   Future    Number of Occurrences:   1    Standing Expiration Date:   02/28/2024    Order Specific Question:   Reason for Exam (SYMPTOM  OR DIAGNOSIS REQUIRED)    Answer:   worsening cough x 1 week. SHOB with exertion, fatigue. rule out pneumonia    Order Specific Question:   Preferred imaging location?    Answer:   [facility]   POC COVID-19    Order Specific Question:   Previously tested for COVID-19    Answer:   Yes    Order Specific Question:   Resident in a congregate (group) care setting    Answer:   No    Order Specific Question:   Employed in healthcare setting    Answer:   No    Order Specific Question:   Pregnant    Answer:   No   Lab Orders         POC COVID-19     No images are attached to the encounter or orders placed in the encounter.  Return if symptoms worsen or fail to improve.   Salvatore Decent, FNP

## 2023-08-31 NOTE — Patient Instructions (Signed)
Go to [facility] for chest xray now , I will call you with results.

## 2023-08-31 NOTE — Telephone Encounter (Signed)
Pt called to say the pharmacy needs more/different info on the script

## 2023-09-09 ENCOUNTER — Ambulatory Visit (HOSPITAL_COMMUNITY): Payer: BC Managed Care – PPO | Attending: Cardiology

## 2023-09-09 DIAGNOSIS — R0609 Other forms of dyspnea: Secondary | ICD-10-CM | POA: Diagnosis present

## 2023-09-09 LAB — ECHOCARDIOGRAM COMPLETE
Area-P 1/2: 4.58 cm2
S' Lateral: 3 cm

## 2023-10-05 ENCOUNTER — Ambulatory Visit (INDEPENDENT_AMBULATORY_CARE_PROVIDER_SITE_OTHER): Payer: BC Managed Care – PPO

## 2023-10-05 DIAGNOSIS — M722 Plantar fascial fibromatosis: Secondary | ICD-10-CM

## 2023-10-05 DIAGNOSIS — M2141 Flat foot [pes planus] (acquired), right foot: Secondary | ICD-10-CM

## 2023-10-05 DIAGNOSIS — M7671 Peroneal tendinitis, right leg: Secondary | ICD-10-CM | POA: Diagnosis not present

## 2023-10-05 NOTE — Progress Notes (Signed)
Patient was present and left orthotic adjusted arch dropped and thinned out  Patient also would like a 2nd pair of orthotics if denied by INS then Patient will receive the discount of half off making her owe $245   Addison Bailey Cped, CFo, CFm

## 2023-11-03 NOTE — Telephone Encounter (Signed)
Copied from CRM [id number]. Topic: Clinical - Request for Lab/Test Order >> Nov 03, 2023  2:38 PM Corin V wrote: Reason for CRM: Patient is requesting labs be ordered for her upcoming physical. She is also wanting to verify if she can get these done at a prior appointment and some back a separate day for the physical. Please advise if physical also needs to be fasted.

## 2023-11-17 ENCOUNTER — Ambulatory Visit: Payer: BC Managed Care – PPO

## 2023-11-17 NOTE — Progress Notes (Signed)
Patient presents today to pick up custom molded foot orthotics, diagnosed with PF by Dr. Logan Bores.   Orthotics were dispensed and fit was satisfactory. Reviewed instructions for break-in and wear. Written instructions given to patient.  Patient will follow up as needed.  Alyssa Cox CPed, CFo, CFm

## 2023-11-29 ENCOUNTER — Ambulatory Visit: Payer: Self-pay | Admitting: Family Medicine

## 2023-11-29 NOTE — Telephone Encounter (Signed)
Copied from CRM [id number]. Topic: Clinical - Red Word Triage >> Nov 29, 2023  8:35 AM Irine Seal wrote: [facility] that prompted transfer to Nurse Triage:  pain and numbness in thumb traveling to wrist, hot and cold tingling sensation, presented in 09/2024 and progressed.   Chief Complaint: Right 1st 2nd 3rd digit numbness Symptoms: numbness, hot and cold feeling Frequency: since December Pertinent Negatives: Patient denies injury, chest pain, difficulty breathing, palpitations, headache, dizziness, vision loss, double vision, changes in speech, unsteady on feet Disposition: [] ED /[] Urgent Care (no appt availability in office) / [x] Appointment(In office/virtual)/ []  [facility]/ [] Home Care/ [] Refused Recommended Disposition /[] [facility]/ []  Follow-up with PCP Additional Notes: Patient called and advised that her right pointer finger  This started in December, January her middle finger started with this, and then her thumb started to feel this way about 2 days ago. Patient went to a chiropractor and had it checked out, she has wrapped her wrist, and now she states that it is feeling hot/cold. Patient has been to a chiropractor 4 times in the past month and has been told it may be a pinched nerve but cause is not completely known. Patient denies injury, chest pain, difficulty breathing, palpitations, headache, dizziness, vision loss, double vision, changes in speech, unsteady on feet. Patient was offered an appointment today but unable to come due to taking her grandchildren to school and a meeting this afternoon so appointment was made for tomorrow. Appointment made for tomorrow 11/30/2023 at 2:40 pm with Salvatore Decent Patient is advised that if things get worse to go to the emergency room and she verbalized understanding.  Reason for Disposition  [1] Numbness or tingling in one or both hands AND [2] is a chronic symptom (recurrent or ongoing AND present > 4 weeks)  Answer  Assessment - Initial Assessment Questions 1. SYMPTOM: "What is the main symptom you are concerned about?" (e.g., weakness, numbness)     Numbness of 1st, 2nd, and 3rd digits of the right hand since December 2. ONSET: "When did this start?" (minutes, hours, days; while sleeping)     December 3. LAST NORMAL: "When was the last time you (the patient) were normal (no symptoms)?"     Before december 4. PATTERN "Does this come and go, or has it been constant since it started?"  "Is it present now?"     Mostly constant but now getting hot/cold 5. CARDIAC SYMPTOMS: "Have you had any of the following symptoms: chest pain, difficulty breathing, palpitations?"     no 6. NEUROLOGIC SYMPTOMS: "Have you had any of the following symptoms: headache, dizziness, vision loss, double vision, changes in speech, unsteady on your feet?"     no 7. OTHER SYMPTOMS: "Do you have any other symptoms?"     no  Protocols used: Neurologic Deficit-A-AH

## 2023-11-30 ENCOUNTER — Ambulatory Visit (INDEPENDENT_AMBULATORY_CARE_PROVIDER_SITE_OTHER): Payer: BC Managed Care – PPO | Admitting: Internal Medicine

## 2023-11-30 ENCOUNTER — Encounter: Payer: Self-pay | Admitting: Internal Medicine

## 2023-11-30 VITALS — BP 129/75 | HR 64 | Temp 97.5°F | Resp 18 | Wt 135.6 lb

## 2023-11-30 DIAGNOSIS — R42 Dizziness and giddiness: Secondary | ICD-10-CM

## 2023-11-30 DIAGNOSIS — G5601 Carpal tunnel syndrome, right upper limb: Secondary | ICD-10-CM

## 2023-11-30 NOTE — Progress Notes (Unsigned)
[facility] [address] [place] [address] Dept: [phone] Dept Fax: [phone]  Acute Care Office Visit  Subjective:   Alyssa Cox 06/14/1961 11/30/2023  Chief Complaint  Patient presents with   Acute Visit    PT C/O of numbness in right hand mainly pointer, middle and thumb fingers and joint. PT also mentioned neck stiffness and wrist discomfort for 3 months. She been doing stretching exercises and taking MOBIC; she also been experiencing lightness over the weekend.     HPI: Discussed the use of AI scribe software for clinical note transcription with the patient, who gave verbal consent to proceed.  History of Present Illness   The patient, with a history of vertigo and extensive typing due to work, presents with numbness in the fingers of the right hand. The numbness started in the pointer finger, then progressed to the middle finger, and most recently the thumb. The patient describes the numbness as similar to the sensation when a limb falls asleep, but without the associated tingling sensation upon waking up. The patient also reports a slight pain in the wrist. The symptoms have been ongoing since December. The patient has been managing the symptoms with a CBC type ointment, wrapping the hand, and taking half a meloxicam before bed. 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 The patient has been doing light weight exercises at home and has a history of extensive typing due to work.         The following portions of the patient's history were reviewed and updated as appropriate: past medical history, past surgical history, family history, social history, allergies, medications, and problem list.   Patient Active Problem List   Diagnosis Date Noted   Palpitations 07/04/2023   Chest tightness 07/04/2023   Dyspnea on exertion 07/04/2023   Laryngitis 02/15/2023    Lumbar radiculopathy 11/18/2022   Leukopenia 06/22/2022   Sciatica of right side 06/22/2022   Flu vaccine need 06/22/2022   Allergic rhinitis 12/17/2021   Other fatigue 12/17/2021   Allergic reaction 11/26/2021   Xerosis cutis 11/26/2021   Gynecologic exam normal 11/19/2021   History of total abdominal hysterectomy and bilateral salpingo-oophorectomy 11/19/2021   Vaginal atrophy 11/19/2021   Glaucoma 09/23/2021   Encounter for screening for coronary artery disease 09/23/2021   Plantar fasciitis 09/23/2021   Need for shingles vaccine 09/23/2021   Past Medical History:  Diagnosis Date   Allergic rhinitis    Anal fissure    Chronic idiopathic constipation    Colon polyps    Endometriosis    Pulled muscle 10/2022   Past Surgical History:  Procedure Laterality Date   ABDOMINAL HYSTERECTOMY     ANAL FISSURE REPAIR N/A 01/04/2023   Procedure: ANAL DILATION;  Surgeon: Romie Levee, MD;  Location: [facility];  Service: General;  Laterality: N/A;   CESAREAN SECTION     HEMORRHOID SURGERY     SPHINCTEROTOMY N/A 01/04/2023   Procedure: CHEMICAL SPHINCTEROTOMY;  Surgeon: Romie Levee, MD;  Location: [facility] [place];  Service: General;  Laterality: N/A;   VASCULAR SURGERY     Family History  Problem Relation Age of Onset   Ovarian cancer Mother        mets to eye   Hypertension Mother    Stroke Father    Hypertension Father    Hypertension Brother    Anuerysm Maternal Grandmother    Colon cancer Maternal Grandfather  Stroke Maternal Grandfather    Asthma Daughter    Asthma Son    Breast cancer Neg Hx    Esophageal cancer Neg Hx    Rectal cancer Neg Hx    Stomach cancer Neg Hx     Current Outpatient Medications:    albuterol (VENTOLIN HFA) 108 (90 Base) MCG/ACT inhaler, Inhale 1-2 puffs into the lungs every 6 (six) hours as needed for wheezing or shortness of breath., Disp: 8 g, Rfl: 2   AMBULATORY NON FORMULARY MEDICATION, Medication Name:  Diltiazem 2% with lidocaine 2% cream.  Apply a pea sized amount per rectum 3 times a day [facility], Disp: 30 g, Rfl: 0   calcium carbonate (OS-CAL - DOSED IN MG OF ELEMENTAL CALCIUM) 1250 (500 Ca) MG tablet, Take 1 tablet by mouth., Disp: , Rfl:    Chlorphen-PE-Acetaminophen 4-10-325 MG TABS, May take 1 every 6 hours as needed., Disp: 30 tablet, Rfl: 0   cholecalciferol (VITAMIN D3) 25 MCG (1000 UNIT) tablet, Take 1,000 Units by mouth daily., Disp: , Rfl:    fluconazole (DIFLUCAN) 150 MG tablet, Take 1 tablet by mouth once. Repeat dose in 3 days if symptoms persist., Disp: 2 tablet, Rfl: 0   fluticasone (FLONASE) 50 MCG/ACT nasal spray, SPRAY 2 SPRAYS INTO EACH NOSTRIL EVERY DAY, Disp: 48 mL, Rfl: 2   hydrocortisone 2.5 % ointment, SMARTSIG:sparingly Topical Twice Daily, Disp: , Rfl:    latanoprost (XALATAN) 0.005 % ophthalmic solution, latanoprost 0.005 % eye drops  INSTILL 1 DROP INTO BOTH EYES AT BEDTIME, Disp: , Rfl:    meloxicam (MOBIC) 7.5 MG tablet, Take 1 tablet (7.5 mg total) by mouth daily as needed for pain., Disp: 30 tablet, Rfl: 2   Multiple Vitamins-Minerals (MULTIVITAMIN WOMEN 50+ PO), Take by mouth., Disp: , Rfl:    mupirocin ointment (BACTROBAN) 2 %, Apply a thin coat to papule twice daily for 7 days. (Patient taking differently: as needed. Apply a thin coat to papule twice daily for 7 days.), Disp: 22 g, Rfl: 0   rosuvastatin (CRESTOR) 10 MG tablet, Take 1 tablet (10 mg total) by mouth daily., Disp: 90 tablet, Rfl: 3   VAGIFEM 10 MCG TABS vaginal tablet, PLACE 1 TABLET VAGINALLY 2 TIMES A WEEK., Disp: 24 tablet, Rfl: 3 Allergies  Allergen Reactions   Morphine Itching     ROS: A complete ROS was performed with pertinent positives/negatives noted in the HPI. The remainder of the ROS are negative.    Objective:   Today's Vitals   11/30/23 0939  BP: 129/75  Pulse: 64  Resp: 18  Temp: (!) 97.5 F (36.4 C)  TempSrc: Temporal  SpO2: 100%  Weight: 135 lb 9.6 oz  (61.5 kg)  PainSc: 6   PainLoc: Hand    GENERAL: Well-appearing, in NAD. Well nourished.  SKIN: Pink, warm and dry. No rash, lesion, ulceration, or ecchymoses.  NECK: Trachea midline. Full ROM w/o pain or tenderness. No lymphadenopathy.  RESPIRATORY: Chest wall symmetrical. Respirations even and non-labored. Breath sounds clear to auscultation bilaterally.  CARDIAC: S1, S2 present, regular rate and rhythm. Peripheral pulses 2+ bilaterally.  MSK: Muscle tone and strength appropriate for age. Joints w/o tenderness, redness, or swelling.*** EXTREMITIES: Without clubbing, cyanosis, or edema.  NEUROLOGIC: No motor or sensory deficits. Steady, even gait.  PSYCH/MENTAL STATUS: Alert, oriented x 3. Cooperative, appropriate mood and affect.    No results found for any visits on 11/30/23.    Assessment & Plan:  Assessment and Plan  Carpal Tunnel Syndrome Numbness in the thumb, index, and middle fingers. Pain in the wrist. Symptoms exacerbated by flexion of the wrist. 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 -Start Naproxen twice daily after meals for 1-1.5 weeks. -Use a wrist splint during the day and at night. -If no improvement, consider referral to orthopedics for possible steroid injection.  Lightheadedness Patient reported feeling lightheaded and dizzy. No signs of vertigo or ear infection. Possible side effect of Meloxicam. -Discontinue Meloxicam due to side effects. -Monitor blood pressure. -If vertigo symptoms develop, consider Epley's maneuvers at home.          Carpal tunnel syndrome of right wrist   No orders of the defined types were placed in this encounter.  No orders of the defined types were placed in this encounter.  Lab Orders  No laboratory test(s) ordered today   No images are attached to the encounter or orders placed in the encounter.  Return if symptoms worsen or fail to improve.   Salvatore Decent, FNP

## 2023-11-30 NOTE — Patient Instructions (Addendum)
Naproxen 500mg  two times a day for 7 days. Take with food.  Use cock-up splint to wrist  Rest  If no improvement , will refer to orthopedics    Do NOT take meloxicam with the naproxen.     Vertigo: Epley's maneuvers (can look up on youtube)

## 2023-12-07 ENCOUNTER — Other Ambulatory Visit: Payer: Self-pay | Admitting: Internal Medicine

## 2023-12-07 ENCOUNTER — Telehealth: Payer: Self-pay

## 2023-12-07 DIAGNOSIS — G5601 Carpal tunnel syndrome, right upper limb: Secondary | ICD-10-CM

## 2023-12-07 NOTE — Telephone Encounter (Signed)
Referral placed. thanks

## 2023-12-07 NOTE — Telephone Encounter (Signed)
Patient states that she got a call to get scheduled with Dr Deno Etienne.  Is he a hand specialis?  Please review and advise.  Thanks .Dm/cma

## 2023-12-07 NOTE — Telephone Encounter (Signed)
Copied from CRM [id number]. Topic: General - Other >> Dec 07, 2023 10:29 AM Corin V wrote: Reason for CRM: Patient is calling and requesting a cortisone injection for her hand due to symptoms not improving. Per Patient she had discussed this at the appointment with Salvatore Decent.  Can you place the referral to Dr Dominica Severin to get the cortizone injection.  Unable to use her RT and and is using the brace and Ibuprofen.   Please review and advise.  Thanks.  Dm/cma

## 2023-12-07 NOTE — Telephone Encounter (Signed)
I think this ws supposed to be sent to Dr Amanda Pea.  Can you please check on this? Thanks. Dm/cma

## 2023-12-07 NOTE — Telephone Encounter (Signed)
I am not sure. I placed the referral and put in Dr. Amanda Pea as patient's preference. She may need to call the clinic back and ask.

## 2023-12-08 NOTE — Telephone Encounter (Signed)
 Patient notified VIA phone. Dm/cma

## 2023-12-14 ENCOUNTER — Ambulatory Visit (INDEPENDENT_AMBULATORY_CARE_PROVIDER_SITE_OTHER): Payer: BC Managed Care – PPO | Admitting: Orthopaedic Surgery

## 2023-12-14 DIAGNOSIS — G5601 Carpal tunnel syndrome, right upper limb: Secondary | ICD-10-CM | POA: Diagnosis not present

## 2023-12-14 MED ORDER — CYCLOBENZAPRINE HCL 5 MG PO TABS: 5.0000 mg | ORAL_TABLET | Freq: Every day | ORAL | 3 refills | Status: AC | PRN

## 2023-12-14 MED ORDER — LIDOCAINE HCL 1 % IJ SOLN
1.0000 mL | INTRAMUSCULAR | Status: AC | PRN
  Administered 2023-12-14: 1 mL

## 2023-12-14 MED ORDER — BUPIVACAINE HCL 0.5 % IJ SOLN
1.0000 mL | INTRAMUSCULAR | Status: AC | PRN
  Administered 2023-12-14: 1 mL

## 2023-12-14 MED ORDER — METHYLPREDNISOLONE ACETATE 40 MG/ML IJ SUSP
40.0000 mg | INTRAMUSCULAR | Status: AC | PRN
  Administered 2023-12-14: 40 mg

## 2023-12-14 NOTE — Progress Notes (Signed)
 Office Visit Note   Patient: Alyssa Cox           Date of Birth: Oct 26, 1960           MRN: [id number] Visit Date: 12/14/2023              Requested by: Alyssa Decent, FNP [address] [place],  [place] [address] PCP: Alyssa Sax, MD   Assessment & Plan: Visit Diagnoses:  1. Right carpal tunnel syndrome     Plan: Alyssa Cox is a very pleasant 63 year old female with right carpal tunnel syndrome.  Treatment options were explained and disease process reviewed.  We will do a carpal tunnel injection today.  She will take it easy for couple days and then resume activity as tolerated.  She should continue to wear the splint at least at nighttime.  Follow-Up Instructions: No follow-ups on file.   Orders:  Orders Placed This Encounter  Procedures   Hand/UE Inj: R carpal tunnel   Meds ordered this encounter  Medications   cyclobenzaprine (FLEXERIL) 5 MG tablet    Sig: Take 1 tablet (5 mg total) by mouth daily as needed for muscle spasms.    Dispense:  10 tablet    Refill:  3      Procedures: Hand/UE Inj: R carpal tunnel for carpal tunnel syndrome on 12/14/2023 8:44 AM Indications: pain Details: 25 G needle Medications: 1 mL lidocaine 1 %; 1 mL bupivacaine 0.5 %; 40 mg methylPREDNISolone acetate 40 MG/ML Outcome: tolerated well, no immediate complications Patient was prepped and draped in the usual sterile fashion.       Clinical Data: No additional findings.   Subjective: Chief Complaint  Patient presents with   Right Wrist - Pain    HPI Alyssa Cox is a very pleasant 63 year old female here for evaluation of suspected carpal tunnel syndrome in the right hand since December 2024.  She has had pain as well.  She is retired.  Recently has been doing a lot of painting.  She feels numbness and tingling mainly in the thumb index and long finger with the index finger being the worst.  Feels like a tight rubber band is around the index finger.  She has worn a  Velcro splint during the night and day which has not made a significant improvement.  Aleve provides temporary relief. Review of Systems  Constitutional: Negative.   HENT: Negative.    Eyes: Negative.   Respiratory: Negative.    Cardiovascular: Negative.   Endocrine: Negative.   Musculoskeletal: Negative.   Neurological: Negative.   Hematological: Negative.   Psychiatric/Behavioral: Negative.    All other systems reviewed and are negative.    Objective: Vital Signs: There were no vitals taken for this visit.  Physical Exam Vitals and nursing note reviewed.  Constitutional:      Appearance: She is well-developed.  HENT:     Head: Atraumatic.     Nose: Nose normal.  Eyes:     Extraocular Movements: Extraocular movements intact.  Cardiovascular:     Pulses: Normal pulses.  Pulmonary:     Effort: Pulmonary effort is normal.  Abdominal:     Palpations: Abdomen is soft.  Musculoskeletal:     Cervical back: Neck supple.  Skin:    General: Skin is warm.     Capillary Refill: Capillary refill takes less than 2 seconds.  Neurological:     Mental Status: She is alert. Mental status is at baseline.  Psychiatric:  Behavior: Behavior normal.        Thought Content: Thought content normal.        Judgment: Judgment normal.     Ortho Exam Emanation of the right hand shows no significant muscle atrophy.  No neurovascular compromise.  Negative carpal tunnel compressive signs.  Negative CMC grind test. Specialty Comments:  No specialty comments available.  Imaging: No results found.   PMFS History: Patient Active Problem List   Diagnosis Date Noted   Palpitations 07/04/2023   Chest tightness 07/04/2023   Dyspnea on exertion 07/04/2023   Laryngitis 02/15/2023   Lumbar radiculopathy 11/18/2022   Leukopenia 06/22/2022   Sciatica of right side 06/22/2022   Flu vaccine need 06/22/2022   Allergic rhinitis 12/17/2021   Other fatigue 12/17/2021   Allergic reaction  11/26/2021   Xerosis cutis 11/26/2021   Gynecologic exam normal 11/19/2021   History of total abdominal hysterectomy and bilateral salpingo-oophorectomy 11/19/2021   Vaginal atrophy 11/19/2021   Glaucoma 09/23/2021   Encounter for screening for coronary artery disease 09/23/2021   Plantar fasciitis 09/23/2021   Need for shingles vaccine 09/23/2021   Past Medical History:  Diagnosis Date   Allergic rhinitis    Anal fissure    Chronic idiopathic constipation    Colon polyps    Endometriosis    Pulled muscle 10/2022    Family History  Problem Relation Age of Onset   Ovarian cancer Mother        mets to eye   Hypertension Mother    Stroke Father    Hypertension Father    Hypertension Brother    Anuerysm Maternal Grandmother    Colon cancer Maternal Grandfather    Stroke Maternal Grandfather    Asthma Daughter    Asthma Son    Breast cancer Neg Hx    Esophageal cancer Neg Hx    Rectal cancer Neg Hx    Stomach cancer Neg Hx     Past Surgical History:  Procedure Laterality Date   ABDOMINAL HYSTERECTOMY     ANAL FISSURE REPAIR N/A 01/04/2023   Procedure: ANAL DILATION;  Surgeon: Romie Levee, MD;  [facility]: [facility];  Service: General;  Laterality: N/A;   CESAREAN SECTION     HEMORRHOID SURGERY     SPHINCTEROTOMY N/A 01/04/2023   Procedure: CHEMICAL SPHINCTEROTOMY;  Surgeon: Romie Levee, MD;  Location: [facility];  Service: General;  Laterality: N/A;   VASCULAR SURGERY     Social History   Occupational History   Not on file  Tobacco Use   Smoking status: Former    Current packs/day: 0.00    Types: Cigarettes    Quit date: 10/03/2013    Years since quitting: 10.2   Smokeless tobacco: Never  Vaping Use   Vaping status: Never Used  Substance and Sexual Activity   Alcohol use: Yes    Alcohol/week: 2.0 standard drinks of alcohol    Types: 2 Glasses of wine per week    Comment: 1-2 drinks a week   Drug use: Never   Sexual  activity: Yes    Birth control/protection: Surgical

## 2023-12-20 ENCOUNTER — Ambulatory Visit: Admitting: Podiatry

## 2023-12-20 ENCOUNTER — Encounter: Payer: Self-pay | Admitting: Podiatry

## 2023-12-20 DIAGNOSIS — S93601A Unspecified sprain of right foot, initial encounter: Secondary | ICD-10-CM | POA: Diagnosis not present

## 2023-12-20 DIAGNOSIS — M7661 Achilles tendinitis, right leg: Secondary | ICD-10-CM

## 2023-12-20 MED ORDER — METHYLPREDNISOLONE 4 MG PO TBPK: ORAL_TABLET | ORAL | 0 refills | Status: DC

## 2023-12-20 MED ORDER — BETAMETHASONE SOD PHOS & ACET 6 (3-3) MG/ML IJ SUSP
3.0000 mg | Freq: Once | INTRAMUSCULAR | Status: AC
  Administered 2023-12-20: 3 mg via INTRA_ARTICULAR

## 2023-12-20 NOTE — Progress Notes (Signed)
   Chief Complaint  Patient presents with   Tendonitis    "I was stretching on last Thursday morning and I think I pulled my tendon again.  I wanted to have it checked before my trip to [place]."    HPI: 63 y.o. female presenting today for evaluation of right posterior heel pain that began on Thursday, 01/12/2024.  Patient states that she was doing her morning stretches and she felt some pain and tenderness to the posterior heel.  Past Medical History:  Diagnosis Date   Allergic rhinitis    Anal fissure    Chronic idiopathic constipation    Colon polyps    Endometriosis    Pulled muscle 10/2022    Past Surgical History:  Procedure Laterality Date   ABDOMINAL HYSTERECTOMY     ANAL FISSURE REPAIR N/A 01/04/2023   Procedure: ANAL DILATION;  Surgeon: Romie Levee, MD;  Location: [facility];  Service: General;  Laterality: N/A;   CESAREAN SECTION     HEMORRHOID SURGERY     SPHINCTEROTOMY N/A 01/04/2023   Procedure: CHEMICAL SPHINCTEROTOMY;  Surgeon: Romie Levee, MD;  Location: [facility];  Service: General;  Laterality: N/A;   VASCULAR SURGERY      Allergies  Allergen Reactions   Morphine Itching     Physical Exam: General: The patient is alert and oriented x3 in no acute distress.  Dermatology: Skin is warm, dry and supple bilateral lower extremities. Negative for open lesions or macerations.  Vascular: Palpable pedal pulses bilaterally. No edema or erythema noted. Capillary refill within normal limits.  Neurological: Grossly intact via light touch  Musculoskeletal Exam: Pain on palpation noted to the posterior tubercle of the right calcaneus at the insertion of the Achilles tendon consistent with retrocalcaneal bursitis. Range of motion within normal limits. Muscle strength 5/5 in all muscle groups bilateral lower extremities. There are some generalized pain diffusely throughout the posterior aspect of the foot as well  Assessment: 1.  Insertional Achilles tendinitis right 2.  Foot sprain right  Plan of Care:  -Patient evaluated -Injection of 0.5 cc Celestone Soluspan injected around the posterior heel right -Patient has a immobilization cam boot at home.  Recommend wearing for 3-4 days WBAT.  After that transition back into good supportive tennis shoes -Prescription for Medrol Dosepak -Patient has grogginess associated with NSAIDs.  No NSAIDs prescribed -Return to clinic as needed   Felecia Shelling, DPM [facility]  Dr. Felecia Shelling, DPM    [address] [place], [place] [address]                Office [phone]  Fax [phone]

## 2023-12-22 ENCOUNTER — Ambulatory Visit: Payer: Self-pay | Admitting: Podiatry

## 2023-12-26 ENCOUNTER — Encounter: Payer: BC Managed Care – PPO | Admitting: Family Medicine

## 2023-12-29 ENCOUNTER — Telehealth: Payer: Self-pay

## 2023-12-29 NOTE — Telephone Encounter (Signed)
Mychart message sent for appointment

## 2023-12-29 NOTE — Telephone Encounter (Signed)
 Noted.

## 2023-12-29 NOTE — Telephone Encounter (Signed)
 Copied from CRM [id number]. Topic: Clinical - Medical Advice >> Dec 29, 2023  8:49 AM Alyssa Cox wrote: Patient confirmed she will attend appointment tomorrow.

## 2023-12-29 NOTE — Telephone Encounter (Signed)
 Left VM for call back if date and time doesn't work for appointment.

## 2023-12-30 ENCOUNTER — Other Ambulatory Visit: Payer: Self-pay | Admitting: Family Medicine

## 2023-12-30 ENCOUNTER — Ambulatory Visit (INDEPENDENT_AMBULATORY_CARE_PROVIDER_SITE_OTHER): Admitting: Family Medicine

## 2023-12-30 ENCOUNTER — Encounter: Payer: Self-pay | Admitting: Family Medicine

## 2023-12-30 ENCOUNTER — Telehealth: Payer: Self-pay | Admitting: Family Medicine

## 2023-12-30 DIAGNOSIS — H1013 Acute atopic conjunctivitis, bilateral: Secondary | ICD-10-CM

## 2023-12-30 DIAGNOSIS — J309 Allergic rhinitis, unspecified: Secondary | ICD-10-CM

## 2023-12-30 MED ORDER — OLOPATADINE HCL 0.7 % OP SOLN: 1.0000 [drp] | Freq: Every day | OPHTHALMIC | 11 refills | Status: DC | PRN

## 2023-12-30 MED ORDER — FLUTICASONE PROPIONATE 50 MCG/ACT NA SUSP: 2.0000 | Freq: Every day | NASAL | 3 refills | Status: AC

## 2023-12-30 MED ORDER — PSEUDOEPHEDRINE HCL ER 240 MG PO TB24: 1.0000 | ORAL_TABLET | Freq: Every day | ORAL | 3 refills | Status: DC | PRN

## 2023-12-30 MED ORDER — AZELASTINE HCL 0.1 % NA SOLN: 2.0000 | Freq: Two times a day (BID) | NASAL | 3 refills | Status: AC

## 2023-12-30 MED ORDER — MONTELUKAST SODIUM 10 MG PO TABS: 10.0000 mg | ORAL_TABLET | Freq: Every evening | ORAL | 3 refills | Status: AC | PRN

## 2023-12-30 MED ORDER — FEXOFENADINE HCL 180 MG PO TABS: 180.0000 mg | ORAL_TABLET | Freq: Every day | ORAL | 3 refills | Status: AC | PRN

## 2023-12-30 NOTE — Progress Notes (Signed)
 Hi  Assessment/Plan:      Allergic Rhinitis with Conjunctivitis Symptoms are consistent with allergic rhinitis and conjunctivitis, likely due to environmental allergens. She presents with sinus congestion, itchy eyes, and nasal discharge. Her history of seasonal allergies and mild asthma supports this diagnosis. Symptoms have persisted since Wednesday, with nasal congestion and green crusty eye discharge. Current treatment with Flonase and Allegra D is partially effective. Bacterial conjunctivitis was considered but symptoms align more with allergies. She seeks effective symptom management due to upcoming jury duty. Montelukast was discussed for additional control, with caution regarding potential mood changes and increased suicide risk. It is considered a last resort if symptoms worsen. - Prescribe azelastine nasal spray, 2 sprays in each nostril twice daily. - Continue Flonase nasal spray as needed. - Continue Allegra D as needed. - Recommend over-the-counter olopatadine eye drops for itchy, red eyes. - Advise warm compresses and HEPA filters to reduce allergen exposure. - Recommend wearing a mask outdoors to reduce pollen exposure. - Provide suicide prevention resources due to potential side effects of montelukast.  Follow-up She has an upcoming appointment with a hand specialist in April and is experiencing difficulty accessing MyChart to leave a message for the provider. - Advise leaving a message with the front desk for the provider regarding the hand specialist appointment.        Medications Discontinued During This Encounter  Medication Reason   Chlorphen-PE-Acetaminophen 4-10-325 MG TABS    fluticasone (FLONASE) 50 MCG/ACT nasal spray Reorder    Return if symptoms worsen or fail to improve.    Subjective:   Encounter date: 12/30/2023  Alyssa Cox is a 63 y.o. female who has Glaucoma; Encounter for screening for coronary artery disease; Plantar fasciitis; Need for  shingles vaccine; Gynecologic exam normal; History of total abdominal hysterectomy and bilateral salpingo-oophorectomy; Vaginal atrophy; Allergic reaction; Xerosis cutis; Allergic rhinitis; Other fatigue; Leukopenia; Sciatica of right side; Flu vaccine need; Lumbar radiculopathy; Laryngitis; Palpitations; Chest tightness; Dyspnea on exertion; and Allergic conjunctivitis and rhinitis, bilateral on their problem list..   She  has a past medical history of Allergic rhinitis, Anal fissure, Chronic idiopathic constipation, Colon polyps, Endometriosis, and Pulled muscle (10/2022)..   She presents with chief complaint of eye concerns  (Pt C/O of seasonal allergies with crusty eyes, itchiness and sinus pressure for 3 days. Pt used Allegra and Flonase for symptoms. ) .   Discussed the use of AI scribe software for clinical note transcription with the patient, who gave verbal consent to proceed.  History of Present Illness   Alyssa Cox is a 63 year old female with allergies who presents with sinus congestion and eye symptoms.  She has been experiencing sinus congestion and eye symptoms following a trip to [place]. These symptoms typically occur in the spring and fall, which she attributes to allergies. She has been using her nasal spray and allergy medications consistently.  Her eyes are itchy with green discharge and crusty buildup, and they were almost glued shut upon waking. She has avoided wearing makeup to prevent contamination. She has a history of using Allegra D and has started using it again. A hot shower provides temporary relief.  She experiences sinus pressure, particularly in the frontal region, rated as a 5 out of 10 in terms of pain. The pressure occasionally radiates to her gums. No fever is present, but she feels tired, possibly due to being awake from midnight to 3 AM.  Her nasal discharge is currently clear, but her eyes  have been producing green discharge. She is using Flonase and  Allegra D for her symptoms.  Her symptoms began on Wednesday, with worsening mucus production on Thursday, and green and crusty discharge on Friday. She has been using warm compresses and HEPA filters at home to manage her symptoms.  She has a history of asthma and uses an albuterol inhaler as needed.       Review of Systems  All other systems reviewed and are negative.   Past Surgical History:  Procedure Laterality Date   ABDOMINAL HYSTERECTOMY     ANAL FISSURE REPAIR N/A 01/04/2023   Procedure: ANAL DILATION;  Surgeon: Romie Levee, MD;  Location: [facility];  Service: General;  Laterality: N/A;   CESAREAN SECTION     HEMORRHOID SURGERY     SPHINCTEROTOMY N/A 01/04/2023   Procedure: CHEMICAL SPHINCTEROTOMY;  Surgeon: Romie Levee, MD;  Location: [facility];  Service: General;  Laterality: N/A;   VASCULAR SURGERY      Outpatient Medications Prior to Visit  Medication Sig Dispense Refill   albuterol (VENTOLIN HFA) 108 (90 Base) MCG/ACT inhaler Inhale 1-2 puffs into the lungs every 6 (six) hours as needed for wheezing or shortness of breath. 8 g 2   AMBULATORY NON FORMULARY MEDICATION Medication Name: Diltiazem 2% with lidocaine 2% cream.  Apply a pea sized amount per rectum 3 times a day [facility] 30 g 0   calcium carbonate (OS-CAL - DOSED IN MG OF ELEMENTAL CALCIUM) 1250 (500 Ca) MG tablet Take 1 tablet by mouth.     cholecalciferol (VITAMIN D3) 25 MCG (1000 UNIT) tablet Take 1,000 Units by mouth daily.     cyclobenzaprine (FLEXERIL) 5 MG tablet Take 1 tablet (5 mg total) by mouth daily as needed for muscle spasms. 10 tablet 3   fluconazole (DIFLUCAN) 150 MG tablet Take 1 tablet by mouth once. Repeat dose in 3 days if symptoms persist. 2 tablet 0   latanoprost (XALATAN) 0.005 % ophthalmic solution latanoprost 0.005 % eye drops  INSTILL 1 DROP INTO BOTH EYES AT BEDTIME     meloxicam (MOBIC) 7.5 MG tablet Take 1 tablet (7.5 mg total) by  mouth daily as needed for pain. 30 tablet 2   methylPREDNISolone (MEDROL DOSEPAK) 4 MG TBPK tablet 6 day dose pack - take as directed 21 tablet 0   Multiple Vitamins-Minerals (MULTIVITAMIN WOMEN 50+ PO) Take by mouth.     mupirocin ointment (BACTROBAN) 2 % Apply a thin coat to papule twice daily for 7 days. (Patient taking differently: as needed. Apply a thin coat to papule twice daily for 7 days.) 22 g 0   rosuvastatin (CRESTOR) 10 MG tablet Take 1 tablet (10 mg total) by mouth daily. 90 tablet 3   VAGIFEM 10 MCG TABS vaginal tablet PLACE 1 TABLET VAGINALLY 2 TIMES A WEEK. 24 tablet 3   Chlorphen-PE-Acetaminophen 4-10-325 MG TABS May take 1 every 6 hours as needed. 30 tablet 0   fluticasone (FLONASE) 50 MCG/ACT nasal spray SPRAY 2 SPRAYS INTO EACH NOSTRIL EVERY DAY 48 mL 2   hydrocortisone 2.5 % ointment SMARTSIG:sparingly Topical Twice Daily     No facility-administered medications prior to visit.    Family History  Problem Relation Age of Onset   Ovarian cancer Mother        mets to eye   Hypertension Mother    Stroke Father    Hypertension Father    Hypertension Brother    Anuerysm Maternal Grandmother  Colon cancer Maternal Grandfather    Stroke Maternal Grandfather    Asthma Daughter    Asthma Son    Breast cancer Neg Hx    Esophageal cancer Neg Hx    Rectal cancer Neg Hx    Stomach cancer Neg Hx     Social History   Socioeconomic History   Marital status: Married    Spouse name: Not on file   Number of children: 2   Years of education: Not on file   Highest education level: Not on file  Occupational History   Not on file  Tobacco Use   Smoking status: Former    Current packs/day: 0.00    Types: Cigarettes    Quit date: 10/03/2013    Years since quitting: 10.2   Smokeless tobacco: Never  Vaping Use   Vaping status: Never Used  Substance and Sexual Activity   Alcohol use: Yes    Alcohol/week: 2.0 standard drinks of alcohol    Types: 2 Glasses of wine per  week    Comment: 3 glasses of wine a week   Drug use: Never   Sexual activity: Yes    Birth control/protection: Surgical  Other Topics Concern   Not on file  Social History Narrative   Not on file   Social Drivers of Health   Financial Resource Strain: Not on file  Food Insecurity: Not on file  Transportation Needs: Not on file  Physical Activity: Not on file  Stress: Not on file  Social Connections: Not on file  Intimate Partner Violence: Not on file                                                                                                  Objective:  Physical Exam: There were no vitals taken for this visit.    Physical Exam Constitutional:      General: She is not in acute distress.    Appearance: Normal appearance. She is not ill-appearing or toxic-appearing.  HENT:     Head: Normocephalic and atraumatic.     Nose: Congestion and rhinorrhea present.  Eyes:     General: No scleral icterus.    Extraocular Movements: Extraocular movements intact.  Cardiovascular:     Rate and Rhythm: Normal rate and regular rhythm.     Pulses: Normal pulses.     Heart sounds: Normal heart sounds.  Pulmonary:     Effort: Pulmonary effort is normal. No respiratory distress.     Breath sounds: Normal breath sounds.  Abdominal:     General: Abdomen is flat. Bowel sounds are normal.     Palpations: Abdomen is soft.  Musculoskeletal:        General: Normal range of motion.  Lymphadenopathy:     Cervical: No cervical adenopathy.  Skin:    General: Skin is warm and dry.     Findings: No rash.  Neurological:     General: No focal deficit present.     Mental Status: She is alert and oriented to person, place, and time. Mental status is at baseline.  Psychiatric:  Mood and Affect: Mood normal.        Behavior: Behavior normal.        Thought Content: Thought content normal.        Judgment: Judgment normal.     No results found.  No results found for this or any  previous visit (from the past 2160 hours).      Garner Nash, MD, MS

## 2023-12-30 NOTE — Patient Instructions (Signed)
 VISIT SUMMARY:  Today, we discussed your sinus congestion and eye symptoms, which you have been experiencing since your trip to [place]. These symptoms are consistent with your history of seasonal allergies. You have been using your nasal spray and allergy medications, but you are still experiencing significant discomfort, particularly with itchy eyes and sinus pressure.  YOUR PLAN:  -ALLERGIC RHINITIS WITH CONJUNCTIVITIS: Allergic rhinitis with conjunctivitis is an allergic reaction that affects your nose and eyes, causing symptoms like sinus congestion, itchy eyes, and nasal discharge. We will continue your current medications, Flonase and Allegra D, and add azelastine nasal spray (2 sprays in each nostril twice daily) and over-the-counter olopatadine eye drops for your itchy, red eyes. Additionally, using warm compresses, HEPA filters, and wearing a mask outdoors can help reduce allergen exposure. Montelukast was discussed as a last resort if symptoms worsen, but be aware of potential mood changes and increased suicide risk. Suicide prevention resources were provided.  INSTRUCTIONS:  Please continue using your current medications and the new treatments as discussed. If your symptoms worsen or you experience any mood changes, contact us immediately. You have an upcoming appointment with a hand specialist in April. Since you are having trouble accessing MyChart, please leave a message with the front desk for the provider regarding this appointment.

## 2023-12-30 NOTE — Telephone Encounter (Signed)
 Pt has seen Dr Dorna Bloom for her finger going numb. This appt didn't help, so she is going to another provider for this concern in April.

## 2024-01-03 ENCOUNTER — Encounter: Payer: BC Managed Care – PPO | Admitting: Family Medicine

## 2024-01-09 ENCOUNTER — Other Ambulatory Visit: Payer: Self-pay

## 2024-01-09 MED ORDER — ROSUVASTATIN CALCIUM 10 MG PO TABS: 10.0000 mg | ORAL_TABLET | Freq: Every day | ORAL | 2 refills | Status: DC

## 2024-01-10 ENCOUNTER — Ambulatory Visit (INDEPENDENT_AMBULATORY_CARE_PROVIDER_SITE_OTHER): Payer: BC Managed Care – PPO | Admitting: Family Medicine

## 2024-01-10 ENCOUNTER — Encounter: Payer: Self-pay | Admitting: Cardiology

## 2024-01-10 ENCOUNTER — Encounter: Payer: Self-pay | Admitting: Family Medicine

## 2024-01-10 VITALS — BP 110/78 | HR 68 | Temp 97.6°F | Ht 65.0 in | Wt 134.2 lb

## 2024-01-10 DIAGNOSIS — Z1159 Encounter for screening for other viral diseases: Secondary | ICD-10-CM

## 2024-01-10 DIAGNOSIS — Z114 Encounter for screening for human immunodeficiency virus [HIV]: Secondary | ICD-10-CM | POA: Diagnosis not present

## 2024-01-10 DIAGNOSIS — Z0000 Encounter for general adult medical examination without abnormal findings: Secondary | ICD-10-CM | POA: Diagnosis not present

## 2024-01-10 DIAGNOSIS — Z131 Encounter for screening for diabetes mellitus: Secondary | ICD-10-CM

## 2024-01-10 DIAGNOSIS — E7800 Pure hypercholesterolemia, unspecified: Secondary | ICD-10-CM | POA: Insufficient documentation

## 2024-01-10 LAB — HEMOGLOBIN A1C: Hgb A1c MFr Bld: 5.6 % (ref 4.6–6.5)

## 2024-01-10 LAB — COMPREHENSIVE METABOLIC PANEL
ALT: 14 U/L (ref 0–35)
AST: 24 U/L (ref 0–37)
Albumin: 4.5 g/dL (ref 3.5–5.2)
Alkaline Phosphatase: 52 U/L (ref 39–117)
BUN: 10 mg/dL (ref 6–23)
CO2: 32 meq/L (ref 19–32)
Calcium: 9.2 mg/dL (ref 8.4–10.5)
Chloride: 102 meq/L (ref 96–112)
Creatinine, Ser: 0.82 mg/dL (ref 0.40–1.20)
GFR: 75.28 mL/min (ref >60.00)
Glucose, Bld: 92 mg/dL (ref 70–99)
Potassium: 4.1 meq/L (ref 3.5–5.1)
Sodium: 140 meq/L (ref 135–145)
Total Bilirubin: 0.8 mg/dL (ref 0.2–1.2)
Total Protein: 6.5 g/dL (ref 6.0–8.3)

## 2024-01-10 LAB — LIPID PANEL
Cholesterol: 210 mg/dL — ABNORMAL HIGH (ref 0–200)
HDL: 125.4 mg/dL (ref >39.00)
LDL Cholesterol: 75 mg/dL (ref 0–99)
NonHDL: 84.47
Total CHOL/HDL Ratio: 2
Triglycerides: 49 mg/dL (ref 0.0–149.0)
VLDL: 9.8 mg/dL (ref 0.0–40.0)

## 2024-01-10 LAB — CBC WITH DIFFERENTIAL/PLATELET
Basophils Absolute: 0.1 10*3/uL (ref 0.0–0.1)
Basophils Relative: 1.6 % (ref 0.0–3.0)
Eosinophils Absolute: 0.2 10*3/uL (ref 0.0–0.7)
Eosinophils Relative: 6 % — ABNORMAL HIGH (ref 0.0–5.0)
HCT: 39.2 % (ref 36.0–46.0)
Hemoglobin: 13 g/dL (ref 12.0–15.0)
Lymphocytes Relative: 40.3 % (ref 12.0–46.0)
Lymphs Abs: 1.7 10*3/uL (ref 0.7–4.0)
MCHC: 33.2 g/dL (ref 30.0–36.0)
MCV: 95.8 fl (ref 78.0–100.0)
Monocytes Absolute: 0.4 10*3/uL (ref 0.1–1.0)
Monocytes Relative: 9.2 % (ref 3.0–12.0)
Neutro Abs: 1.8 10*3/uL (ref 1.4–7.7)
Neutrophils Relative %: 42.9 % — ABNORMAL LOW (ref 43.0–77.0)
Platelets: 195 10*3/uL (ref 150.0–400.0)
RBC: 4.09 Mil/uL (ref 3.87–5.11)
RDW: 13.5 % (ref 11.5–15.5)
WBC: 4.1 10*3/uL (ref 4.0–10.5)

## 2024-01-10 LAB — URINALYSIS, ROUTINE W REFLEX MICROSCOPIC
Bilirubin Urine: NEGATIVE
Ketones, ur: NEGATIVE
Leukocytes,Ua: NEGATIVE
Nitrite: NEGATIVE
RBC / HPF: NONE SEEN (ref 0–?)
Specific Gravity, Urine: <=1.005 — AB (ref 1.000–1.030)
Total Protein, Urine: NEGATIVE
Urine Glucose: NEGATIVE
Urobilinogen, UA: 0.2 (ref 0.0–1.0)
WBC, UA: NONE SEEN (ref 0–?)
pH: 7 (ref 5.0–8.0)

## 2024-01-10 NOTE — Progress Notes (Signed)
 Established Patient Office Visit   Subjective:  Patient ID: Alyssa Cox, female    DOB: 1960/11/03  Age: 63 y.o. MRN: [id number]  Chief Complaint  Patient presents with   Annual Exam    Cpe. Pt is fasting.     HPI Encounter Diagnoses  Name Primary?   Healthcare maintenance Yes   Screening for diabetes mellitus    Elevated LDL cholesterol level    Encounter for hepatitis C screening test for low risk patient    Screening for HIV (human immunodeficiency virus)   Here for physical exam.  Up-to-date on health maintenance.  She is exercising regularly and has regular dental care.  Seeing orthopedics for foot and wrist problems has had recent steroid exposures.  Female care is through GYN.  She is up-to-date on health maintenance.  She also tells of abdominal pain and headaches.  She suffers from chronic constipation and has allergy rhinitis symptoms    Review of Systems  Constitutional: Negative.   HENT: Negative.    Eyes:  Negative for blurred vision, discharge and redness.  Respiratory: Negative.    Cardiovascular: Negative.   Gastrointestinal:  Positive for abdominal pain and constipation. Negative for blood in stool and melena.  Genitourinary: Negative.   Musculoskeletal: Negative.  Negative for myalgias.  Skin:  Negative for rash.  Neurological:  Positive for headaches. Negative for tingling, loss of consciousness and weakness.  Endo/Heme/Allergies:  Negative for polydipsia.      01/10/2024    8:52 AM 12/30/2023   11:13 AM 11/30/2023    9:41 AM  Depression screen PHQ 2/9  Decreased Interest 0 0 0  Down, Depressed, Hopeless 0 0 0  PHQ - 2 Score 0 0 0  Altered sleeping 0    Tired, decreased energy 0    Change in appetite 0    Feeling bad or failure about yourself  0    Trouble concentrating 0    Moving slowly or fidgety/restless 0    Suicidal thoughts 0    PHQ-9 Score 0    Difficult doing work/chores Not difficult at all         Current Outpatient  Medications:    albuterol (VENTOLIN HFA) 108 (90 Base) MCG/ACT inhaler, Inhale 1-2 puffs into the lungs every 6 (six) hours as needed for wheezing or shortness of breath., Disp: 8 g, Rfl: 2   AMBULATORY NON FORMULARY MEDICATION, Medication Name: Diltiazem 2% with lidocaine 2% cream.  Apply a pea sized amount per rectum 3 times a day [employer], Disp: 30 g, Rfl: 0   azelastine (ASTELIN) 0.1 % nasal spray, Place 2 sprays into both nostrils 2 (two) times daily., Disp: 60 mL, Rfl: 3   cholecalciferol (VITAMIN D3) 25 MCG (1000 UNIT) tablet, Take 1,000 Units by mouth daily., Disp: , Rfl:    cyclobenzaprine (FLEXERIL) 5 MG tablet, Take 1 tablet (5 mg total) by mouth daily as needed for muscle spasms., Disp: 10 tablet, Rfl: 3   fexofenadine (ALLEGRA) 180 MG tablet, Take 1 tablet (180 mg total) by mouth daily as needed for allergies or rhinitis., Disp: 90 tablet, Rfl: 3   fluconazole (DIFLUCAN) 150 MG tablet, Take 1 tablet by mouth once. Repeat dose in 3 days if symptoms persist., Disp: 2 tablet, Rfl: 0   fluticasone (FLONASE) 50 MCG/ACT nasal spray, Place 2 sprays into both nostrils daily., Disp: 16 g, Rfl: 3   latanoprost (XALATAN) 0.005 % ophthalmic solution, latanoprost 0.005 % eye drops  INSTILL 1 DROP  INTO BOTH EYES AT BEDTIME, Disp: , Rfl:    meloxicam (MOBIC) 7.5 MG tablet, Take 1 tablet (7.5 mg total) by mouth daily as needed for pain., Disp: 30 tablet, Rfl: 2   methylPREDNISolone (MEDROL DOSEPAK) 4 MG TBPK tablet, 6 day dose pack - take as directed, Disp: 21 tablet, Rfl: 0   montelukast (SINGULAIR) 10 MG tablet, Take 1 tablet (10 mg total) by mouth at bedtime as needed (allergy conjuctivitis and rhiitis)., Disp: 90 tablet, Rfl: 3   Multiple Vitamins-Minerals (MULTIVITAMIN WOMEN 50+ PO), Take by mouth., Disp: , Rfl:    mupirocin ointment (BACTROBAN) 2 %, Apply a thin coat to papule twice daily for 7 days. (Patient taking differently: as needed. Apply a thin coat to papule twice daily for 7  days.), Disp: 22 g, Rfl: 0   Olopatadine HCl 0.2 % SOLN, PLACE 1 DROP INTO BOTH EYES DAILY AS NEEDED (ITCHY, RED, DISCHARGE FROM ALLERGIES)., Disp: 2.5 mL, Rfl: 11   Pseudoephedrine HCl 240 MG TB24, Take 1 tablet (240 mg total) by mouth daily as needed (congestion)., Disp: 90 tablet, Rfl: 3   rosuvastatin (CRESTOR) 10 MG tablet, Take 1 tablet (10 mg total) by mouth daily., Disp: 90 tablet, Rfl: 2   VAGIFEM 10 MCG TABS vaginal tablet, PLACE 1 TABLET VAGINALLY 2 TIMES A WEEK., Disp: 24 tablet, Rfl: 3   calcium carbonate (OS-CAL - DOSED IN MG OF ELEMENTAL CALCIUM) 1250 (500 Ca) MG tablet, Take 1 tablet by mouth., Disp: , Rfl:    hydrocortisone 2.5 % ointment, SMARTSIG:sparingly Topical Twice Daily, Disp: , Rfl:    Objective:     BP 110/78   Pulse 68   Temp 97.6 F (36.4 C)   Ht 5\' 5"  (1.651 m)   Wt 134 lb 3.2 oz (60.9 kg)   SpO2 99%   BMI 22.33 kg/m    Physical Exam Constitutional:      General: She is not in acute distress.    Appearance: Normal appearance. She is not ill-appearing, toxic-appearing or diaphoretic.  HENT:     Head: Normocephalic and atraumatic.     Right Ear: Tympanic membrane, ear canal and external ear normal.     Left Ear: Tympanic membrane, ear canal and external ear normal.     Mouth/Throat:     Mouth: Mucous membranes are moist.     Pharynx: Oropharynx is clear. No oropharyngeal exudate or posterior oropharyngeal erythema.  Eyes:     General: No scleral icterus.       Right eye: No discharge.        Left eye: No discharge.     Extraocular Movements: Extraocular movements intact.     Conjunctiva/sclera: Conjunctivae normal.     Pupils: Pupils are equal, round, and reactive to light.  Cardiovascular:     Rate and Rhythm: Normal rate and regular rhythm.  Pulmonary:     Effort: Pulmonary effort is normal. No respiratory distress.     Breath sounds: Normal breath sounds. No wheezing or rales.  Abdominal:     General: Bowel sounds are normal.      Tenderness: There is no abdominal tenderness. There is no guarding or rebound.  Musculoskeletal:     Cervical back: No rigidity or tenderness.     Right lower leg: No edema.     Left lower leg: No edema.  Lymphadenopathy:     Cervical: No cervical adenopathy.  Skin:    General: Skin is warm and dry.  Neurological:  Mental Status: She is alert and oriented to person, place, and time.     Cranial Nerves: No dysarthria or facial asymmetry.  Psychiatric:        Mood and Affect: Mood normal.        Behavior: Behavior normal.      No results found for any visits on 01/10/24.    The ASCVD Risk score (Arnett DK, et al., 2019) failed to calculate for the following reasons:   The valid HDL cholesterol range is 20 to 100 mg/dL    Assessment & Plan:   Healthcare maintenance -     CBC with Differential/Platelet -     Urinalysis, Routine w reflex microscopic  Screening for diabetes mellitus -     Comprehensive metabolic panel -     Hemoglobin A1c  Elevated LDL cholesterol level -     Comprehensive metabolic panel -     Lipid panel  Encounter for hepatitis C screening test for low risk patient -     Hepatitis C antibody  Screening for HIV (human immunodeficiency virus) -     HIV Antibody (routine testing w rflx)    Return in about 1 year (around 01/09/2025), or Schedule follow-up visit to discuss headaches and abdominal pain.  Continue regular exercise 150 minutes weekly.  Continue follow-up with GYN for Pap smear and pelvic exams.  Schedule follow-up soon for headaches believed to be associated with allergy rhinitis and abdominal pain that could be associated with chronic constipation.  With favorable HDL cholesterol could take rosuvastatin every other day.  Information was given on health maintenance and disease prevention.  Mliss Sax, MD

## 2024-01-11 LAB — HIV ANTIBODY (ROUTINE TESTING W REFLEX): HIV 1&2 Ab, 4th Generation: NONREACTIVE

## 2024-01-11 LAB — HEPATITIS C ANTIBODY: Hepatitis C Ab: NONREACTIVE

## 2024-01-12 ENCOUNTER — Other Ambulatory Visit: Payer: Self-pay | Admitting: Obstetrics

## 2024-01-12 DIAGNOSIS — N952 Postmenopausal atrophic vaginitis: Secondary | ICD-10-CM

## 2024-01-26 DIAGNOSIS — G5601 Carpal tunnel syndrome, right upper limb: Secondary | ICD-10-CM | POA: Insufficient documentation

## 2024-02-03 ENCOUNTER — Other Ambulatory Visit: Payer: Self-pay | Admitting: Family Medicine

## 2024-02-03 DIAGNOSIS — Z1231 Encounter for screening mammogram for malignant neoplasm of breast: Secondary | ICD-10-CM

## 2024-02-20 ENCOUNTER — Encounter

## 2024-03-09 ENCOUNTER — Telehealth: Payer: Self-pay | Admitting: Family Medicine

## 2024-03-09 ENCOUNTER — Inpatient Hospital Stay: Admission: RE | Admit: 2024-03-09 | Source: Ambulatory Visit

## 2024-03-09 NOTE — Telephone Encounter (Signed)
 Copied from CRM [id number]. Topic: Referral - Request for Referral >> Mar 09, 2024  8:04 AM Kita Perish H wrote: Did the patient discuss referral with their provider in the last year? Yes (If No - schedule appointment) (If Yes - send message)  Appointment offered? No  Type of order/referral and detailed reason for visit: Mammogram  Preference of office, provider, location: [place]  If referral order, have you been seen by this specialty before? Yes,  on [address] (If Yes, this issue or another issue? When? Where?  Can we respond through MyChart? No

## 2024-03-20 ENCOUNTER — Ambulatory Visit (HOSPITAL_BASED_OUTPATIENT_CLINIC_OR_DEPARTMENT_OTHER)

## 2024-03-20 ENCOUNTER — Ambulatory Visit (HOSPITAL_BASED_OUTPATIENT_CLINIC_OR_DEPARTMENT_OTHER)
Admission: RE | Admit: 2024-03-20 | Discharge: 2024-03-20 | Disposition: A | Discharge status: Home / Self Care | Source: Ambulatory Visit | Attending: Family Medicine | Admitting: Family Medicine

## 2024-03-20 ENCOUNTER — Encounter (HOSPITAL_BASED_OUTPATIENT_CLINIC_OR_DEPARTMENT_OTHER): Payer: Self-pay

## 2024-03-20 DIAGNOSIS — Z1231 Encounter for screening mammogram for malignant neoplasm of breast: Secondary | ICD-10-CM | POA: Diagnosis present

## 2024-03-28 ENCOUNTER — Telehealth: Payer: Self-pay

## 2024-03-28 NOTE — Telephone Encounter (Signed)
 Pt left VM on Nurse line 03/27/24 @ 11:13a, wanting to get Vagifem  refilled but was told by Pharmacy that the Medicine has been discontinued.Was told that she can do Premarin Vaginal Cream but would need a Rx sent from Provider.

## 2024-03-28 NOTE — Telephone Encounter (Signed)
 Called Pt back to get verification of message regarding the Rx for Premarin. Pt states last saw Dr. Ervin & he Rx Vagifem  for Vaginal Atrophy. Pt wanted another refill but pharmacy advised that it has been discontinued she said. They suggested Premarin & advised would have to get Rx from Provider, so I requested for her to have an appointment with Dr. Elester Grim. Pt verbalized understanding.

## 2024-04-02 ENCOUNTER — Encounter

## 2024-04-05 ENCOUNTER — Telehealth: Admitting: Family Medicine

## 2024-04-05 ENCOUNTER — Ambulatory Visit: Payer: Self-pay

## 2024-04-05 DIAGNOSIS — J019 Acute sinusitis, unspecified: Secondary | ICD-10-CM | POA: Diagnosis not present

## 2024-04-05 DIAGNOSIS — B9689 Other specified bacterial agents as the cause of diseases classified elsewhere: Secondary | ICD-10-CM

## 2024-04-05 MED ORDER — AMOXICILLIN-POT CLAVULANATE 875-125 MG PO TABS: 1.0000 | ORAL_TABLET | Freq: Two times a day (BID) | ORAL | 0 refills | Status: AC

## 2024-04-05 NOTE — Patient Instructions (Signed)
 Clotine M Flammer, thank you for joining Lanetta Pion, NP for today's virtual visit.  While this provider is not your primary care provider (PCP), if your PCP is located in our provider database this encounter information will be shared with them immediately following your visit.   A [facility] MyChart account gives you access to today's visit and all your visits, tests, and labs performed at [facility]  click here if you don't have a [facility] MyChart account or go to [email or web address]  Consent: (Patient) Alyssa Cox provided verbal consent for this virtual visit at the beginning of the encounter.  Current Medications:  Current Outpatient Medications:    amoxicillin-clavulanate (AUGMENTIN) 875-125 MG tablet, Take 1 tablet by mouth 2 (two) times daily for 7 days., Disp: 14 tablet, Rfl: 0   albuterol  (VENTOLIN  HFA) 108 (90 Base) MCG/ACT inhaler, Inhale 1-2 puffs into the lungs every 6 (six) hours as needed for wheezing or shortness of breath., Disp: 8 g, Rfl: 2   AMBULATORY NON FORMULARY MEDICATION, Medication Name: Diltiazem 2% with lidocaine  2% cream.  Apply a pea sized amount per rectum 3 times a day [employer], Disp: 30 g, Rfl: 0   azelastine  (ASTELIN ) 0.1 % nasal spray, Place 2 sprays into both nostrils 2 (two) times daily., Disp: 60 mL, Rfl: 3   calcium  carbonate (OS-CAL - DOSED IN MG OF ELEMENTAL CALCIUM ) 1250 (500 Ca) MG tablet, Take 1 tablet by mouth., Disp: , Rfl:    cholecalciferol (VITAMIN D3) 25 MCG (1000 UNIT) tablet, Take 1,000 Units by mouth daily., Disp: , Rfl:    conjugated estrogens (PREMARIN) vaginal cream, Place vaginally 2 (two) times a week., Disp: 30 g, Rfl: 0   cyclobenzaprine  (FLEXERIL ) 5 MG tablet, Take 1 tablet (5 mg total) by mouth daily as needed for muscle spasms., Disp: 10 tablet, Rfl: 3   fexofenadine  (ALLEGRA ) 180 MG tablet, Take 1 tablet (180 mg total) by mouth daily as needed for allergies or rhinitis., Disp: 90 tablet, Rfl:  3   fluconazole  (DIFLUCAN ) 150 MG tablet, Take 1 tablet by mouth once. Repeat dose in 3 days if symptoms persist., Disp: 2 tablet, Rfl: 0   fluticasone  (FLONASE ) 50 MCG/ACT nasal spray, Place 2 sprays into both nostrils daily., Disp: 16 g, Rfl: 3   latanoprost (XALATAN) 0.005 % ophthalmic solution, latanoprost 0.005 % eye drops  INSTILL 1 DROP INTO BOTH EYES AT BEDTIME, Disp: , Rfl:    meloxicam  (MOBIC ) 7.5 MG tablet, Take 1 tablet (7.5 mg total) by mouth daily as needed for pain., Disp: 30 tablet, Rfl: 2   methylPREDNISolone  (MEDROL  DOSEPAK) 4 MG TBPK tablet, 6 day dose pack - take as directed, Disp: 21 tablet, Rfl: 0   montelukast  (SINGULAIR ) 10 MG tablet, Take 1 tablet (10 mg total) by mouth at bedtime as needed (allergy conjuctivitis and rhiitis)., Disp: 90 tablet, Rfl: 3   Multiple Vitamins-Minerals (MULTIVITAMIN WOMEN 50+ PO), Take by mouth., Disp: , Rfl:    Olopatadine  HCl 0.2 % SOLN, PLACE 1 DROP INTO BOTH EYES DAILY AS NEEDED (ITCHY, RED, DISCHARGE FROM ALLERGIES)., Disp: 2.5 mL, Rfl: 11   Pseudoephedrine  HCl 240 MG TB24, Take 1 tablet (240 mg total) by mouth daily as needed (congestion)., Disp: 90 tablet, Rfl: 3   rosuvastatin  (CRESTOR ) 10 MG tablet, Take 1 tablet (10 mg total) by mouth daily., Disp: 90 tablet, Rfl: 2   Medications ordered in this encounter:  Meds ordered this encounter  Medications   amoxicillin-clavulanate (AUGMENTIN) 875-125 MG  tablet    Sig: Take 1 tablet by mouth 2 (two) times daily for 7 days.    Dispense:  14 tablet    Refill:  0    Supervising Provider:   Corine Dice [[id number]]     *If you need refills on other medications prior to your next appointment, please contact your pharmacy*  Follow-Up: Call back or seek an in-person evaluation if the symptoms worsen or if the condition fails to improve as anticipated.  [facility] [phone]  Other Instructions  - Increased rest - Increasing Fluids - Acetaminophen  / ibuprofen as  needed for fever/pain.  - Salt water gargling, chloraseptic spray and throat lozenges - Mucinex if mucus is present and increasing.  - Saline nasal spray if congestion or if nasal passages feel dry. - Humidifying the air.     If you have been instructed to have an in-person evaluation today at a local Urgent Care facility, please use the link below. It will take you to a list of all of our available [facility], including address, phone number and hours of operation. Please do not delay care.  [facility]  If you or a family member do not have a primary care provider, use the link below to schedule a visit and establish care. When you choose a [facility] primary care physician or advanced practice provider, you gain a long-term partner in health. Find a Primary Care Provider  Learn more about [facility]'s in-office and virtual care options: [facility] - Get Care Now

## 2024-04-05 NOTE — Telephone Encounter (Signed)
 FYI Only or Action Required?: FYI only for provider.  Patient was last seen in primary care on 01/10/2024 by Tonna Frederic, MD. Called Nurse Triage reporting Sinusitis. Symptoms began a week ago. Interventions attempted: OTC medications: mucinex and pseudophed. Symptoms are: gradually worsening.  Triage Disposition: See Physician Within 24 Hours  Patient/caregiver understands and will follow disposition?: Yes   Copied from CRM [id number]. Topic: Clinical - Red Word Triage >> Apr 05, 2024 11:16 AM Alyssa Cox wrote: Red Word that prompted transfer to Nurse Triage: Patient is calling in with  Sinus infection Reason for Disposition  [1] Sinus pain (not just congestion) AND [2] fever  Answer Assessment - Initial Assessment Questions 1. LOCATION: Where does it hurt?      Ear ache, sinus pain around teeth 2. ONSET: When did the sinus pain start?  (e.g., hours, days)      Six days ago 3. SEVERITY: How bad is the pain?   (Scale 1-10; mild, moderate or severe)   - MILD (1-3): doesn't interfere with normal activities    - MODERATE (4-7): interferes with normal activities (e.g., work or school) or awakens from sleep   - SEVERE (8-10): excruciating pain and patient unable to do any normal activities        8/10 4. RECURRENT SYMPTOM: Have you ever had sinus problems before? If Yes, ask: When was the last time? and What happened that time?      Recurrent with allergy issues 5. NASAL CONGESTION: Is the nose blocked? If Yes, ask: Can you open it or must you breathe through your mouth?     Confirms that today her nose is stopped up 6. NASAL DISCHARGE: Do you have discharge from your nose? If so ask, What color?     One day ago, drainage was not clear but not greeen 7. FEVER: Do you have a fever? If Yes, ask: What is it, how was it measured, and when did it start?      Denies today, but chills yesterday 8. OTHER SYMPTOMS: Do you have any other symptoms? (e.g., sore  throat, cough, earache, difficulty breathing)     Cough, ear ache  Protocols used: Sinus Pain or Congestion-A-AH

## 2024-04-05 NOTE — Progress Notes (Signed)
 Virtual Visit Consent   [name], you are scheduled for a virtual visit with a [facility] provider today. Just as with appointments in the office, your consent must be obtained to participate. Your consent will be active for this visit and any virtual visit you may have with one of our providers in the next 365 days. If you have a MyChart account, a copy of this consent can be sent to you electronically.  As this is a virtual visit, video technology does not allow for your provider to perform a traditional examination. This may limit your provider's ability to fully assess your condition. If your provider identifies any concerns that need to be evaluated in person or the need to arrange testing (such as labs, EKG, etc.), we will make arrangements to do so. Although advances in technology are sophisticated, we cannot ensure that it will always work on either your end or our end. If the connection with a video visit is poor, the visit may have to be switched to a telephone visit. With either a video or telephone visit, we are not always able to ensure that we have a secure connection.  By engaging in this virtual visit, you consent to the provision of healthcare and authorize for your insurance to be billed (if applicable) for the services provided during this visit. Depending on your insurance coverage, you may receive a charge related to this service.  I need to obtain your verbal consent now. Are you willing to proceed with your visit today? Alyssa Cox has provided verbal consent on 04/05/2024 for a virtual visit (video or telephone). Lanetta Pion, NP  Date: 04/05/2024 1:46 PM   Virtual Visit via Video Note   I, Lanetta Pion, connected with  Alyssa Cox  ([id number], 06-19-1961) on 04/05/24 at  1:45 PM EDT by a video-enabled telemedicine application and verified that I am speaking with the correct person using two identifiers.  Location: Patient: Virtual Visit Location Patient:  Home Provider: Virtual Visit Location Provider: Home Office   I discussed the limitations of evaluation and management by telemedicine and the availability of in person appointments. The patient expressed understanding and agreed to proceed.    History of Present Illness: Alyssa Cox is a 63 y.o. who identifies as a female who was assigned female at birth, and is being seen today for sinus infection  Onset was Saturday with sore throat, left ear pain Associated symptoms are tired, congestion, cough- mild, but was worse yesterday Does have allergies- on Allegra  or Claritin daily  Modifying factors are OTC cough medication and Mucinex DM  Denies chest pain, shortness of breath, fevers, chills  Exposure to sick contacts- known- husband was sick COVID test: no  Problems:  Patient Active Problem List   Diagnosis Date Noted   Elevated LDL cholesterol level 01/10/2024   Allergic conjunctivitis and rhinitis, bilateral 12/30/2023   Palpitations 07/04/2023   Chest tightness 07/04/2023   Dyspnea on exertion 07/04/2023   Laryngitis 02/15/2023   Lumbar radiculopathy 11/18/2022   Leukopenia 06/22/2022   Sciatica of right side 06/22/2022   Flu vaccine need 06/22/2022   Allergic rhinitis 12/17/2021   Other fatigue 12/17/2021   Allergic reaction 11/26/2021   Xerosis cutis 11/26/2021   Gynecologic exam normal 11/19/2021   History of total abdominal hysterectomy and bilateral salpingo-oophorectomy 11/19/2021   Vaginal atrophy 11/19/2021   Glaucoma 09/23/2021   Screening for HIV (human immunodeficiency virus) 09/23/2021   Plantar fasciitis 09/23/2021  Need for shingles vaccine 09/23/2021    Allergies:  Allergies  Allergen Reactions   Morphine Itching   Medications:  Current Outpatient Medications:    albuterol  (VENTOLIN  HFA) 108 (90 Base) MCG/ACT inhaler, Inhale 1-2 puffs into the lungs every 6 (six) hours as needed for wheezing or shortness of breath., Disp: 8 g, Rfl: 2    AMBULATORY NON FORMULARY MEDICATION, Medication Name: Diltiazem 2% with lidocaine  2% cream.  Apply a pea sized amount per rectum 3 times a day [employer], Disp: 30 g, Rfl: 0   azelastine  (ASTELIN ) 0.1 % nasal spray, Place 2 sprays into both nostrils 2 (two) times daily., Disp: 60 mL, Rfl: 3   calcium  carbonate (OS-CAL - DOSED IN MG OF ELEMENTAL CALCIUM ) 1250 (500 Ca) MG tablet, Take 1 tablet by mouth., Disp: , Rfl:    cholecalciferol (VITAMIN D3) 25 MCG (1000 UNIT) tablet, Take 1,000 Units by mouth daily., Disp: , Rfl:    conjugated estrogens (PREMARIN) vaginal cream, Place vaginally 2 (two) times a week., Disp: 30 g, Rfl: 0   cyclobenzaprine  (FLEXERIL ) 5 MG tablet, Take 1 tablet (5 mg total) by mouth daily as needed for muscle spasms., Disp: 10 tablet, Rfl: 3   fexofenadine  (ALLEGRA ) 180 MG tablet, Take 1 tablet (180 mg total) by mouth daily as needed for allergies or rhinitis., Disp: 90 tablet, Rfl: 3   fluconazole  (DIFLUCAN ) 150 MG tablet, Take 1 tablet by mouth once. Repeat dose in 3 days if symptoms persist., Disp: 2 tablet, Rfl: 0   fluticasone  (FLONASE ) 50 MCG/ACT nasal spray, Place 2 sprays into both nostrils daily., Disp: 16 g, Rfl: 3   latanoprost (XALATAN) 0.005 % ophthalmic solution, latanoprost 0.005 % eye drops  INSTILL 1 DROP INTO BOTH EYES AT BEDTIME, Disp: , Rfl:    meloxicam  (MOBIC ) 7.5 MG tablet, Take 1 tablet (7.5 mg total) by mouth daily as needed for pain., Disp: 30 tablet, Rfl: 2   methylPREDNISolone  (MEDROL  DOSEPAK) 4 MG TBPK tablet, 6 day dose pack - take as directed, Disp: 21 tablet, Rfl: 0   montelukast  (SINGULAIR ) 10 MG tablet, Take 1 tablet (10 mg total) by mouth at bedtime as needed (allergy conjuctivitis and rhiitis)., Disp: 90 tablet, Rfl: 3   Multiple Vitamins-Minerals (MULTIVITAMIN WOMEN 50+ PO), Take by mouth., Disp: , Rfl:    Olopatadine  HCl 0.2 % SOLN, PLACE 1 DROP INTO BOTH EYES DAILY AS NEEDED (ITCHY, RED, DISCHARGE FROM ALLERGIES)., Disp: 2.5 mL, Rfl:  11   Pseudoephedrine  HCl 240 MG TB24, Take 1 tablet (240 mg total) by mouth daily as needed (congestion)., Disp: 90 tablet, Rfl: 3   rosuvastatin  (CRESTOR ) 10 MG tablet, Take 1 tablet (10 mg total) by mouth daily., Disp: 90 tablet, Rfl: 2  Observations/Objective: Patient is well-developed, well-nourished in no acute distress.  Resting comfortably  at home.  Head is normocephalic, atraumatic.  No labored breathing.  Speech is clear and coherent with logical content.  Patient is alert and oriented at baseline.    Assessment and Plan:  1. Acute bacterial sinusitis (Primary)  - amoxicillin-clavulanate (AUGMENTIN) 875-125 MG tablet; Take 1 tablet by mouth 2 (two) times daily for 7 days.  Dispense: 14 tablet; Refill: 0  - Increased rest - Increasing Fluids - Acetaminophen  / ibuprofen as needed for fever/pain.  - Salt water gargling, chloraseptic spray and throat lozenges - Mucinex if mucus is present and increasing.  - Saline nasal spray if congestion or if nasal passages feel dry. - Humidifying the air.  Reviewed side effects, risks and benefits of medication.    Patient acknowledged agreement and understanding of the plan.   Past Medical, Surgical, Social History, Allergies, and Medications have been Reviewed.    Follow Up Instructions: I discussed the assessment and treatment plan with the patient. The patient was provided an opportunity to ask questions and all were answered. The patient agreed with the plan and demonstrated an understanding of the instructions.  A copy of instructions were sent to the patient via MyChart unless otherwise noted below.    The patient was advised to call back or seek an in-person evaluation if the symptoms worsen or if the condition fails to improve as anticipated.    Lanetta Pion, NP

## 2024-05-02 ENCOUNTER — Other Ambulatory Visit

## 2024-05-17 ENCOUNTER — Ambulatory Visit: Admitting: Obstetrics and Gynecology

## 2024-05-18 ENCOUNTER — Ambulatory Visit: Admitting: Obstetrics and Gynecology

## 2024-05-18 VITALS — BP 113/74 | HR 69 | Wt 135.0 lb

## 2024-05-18 DIAGNOSIS — N958 Other specified menopausal and perimenopausal disorders: Secondary | ICD-10-CM | POA: Diagnosis not present

## 2024-05-18 MED ORDER — ESTRADIOL 10 MCG VA TABS: 1.0000 | ORAL_TABLET | VAGINAL | 4 refills | Status: DC

## 2024-05-18 NOTE — Patient Instructions (Addendum)
 [employer]   E-string Shauna is the next line  [employer]

## 2024-05-22 NOTE — Progress Notes (Signed)
 NEW GYNECOLOGY PATIENT Patient name: Alyssa Cox MRN [id number]  Date of birth: 05-Sep-1961 Chief Complaint:   No chief complaint on file.     History:  Alyssa Cox is a 63 y.o. G2P0 being seen today for GSM.  Discussed the use of AI scribe software for clinical note transcription with the patient, who gave verbal consent to proceed.  History of Present Illness Alyssa Cox is a 63 year old female who presents for a refill of vaginal estrogen for dryness and irritation.  She has been using vaginal estrogen at a dosage of 10 micrograms twice a week, typically on Mondays and Wednesdays, primarily for dryness and irritation. She ran out of her medication a month ago and was told that the medication was no longer available. She experiences vaginal dryness, which sometimes leads to irritation and bladder infections. She dislikes the messiness of creams, stating 'I feel like it melts.'  She recently changed her insurance to [employer] in March after retiring, which may have affected her coverage for the medication. She had a hysterectomy and inquires if she needs regular checks post-surgery. She has a history of endometriosis and questions if it should be inactive post-hysterectomy.  She is 'so-so' sexually active and experiences dyspareunia due to dryness. She does not use lubricant alongside vaginal estrogen. She has tried pelvic floor physical therapy for rectal issues due to lack of feeling and problems with evacuation.     Gynecologic History No LMP recorded. Patient has had a hysterectomy. Contraception: status post hysterectomy Last Pap: s/p hysterectomy Last Mammogram: 03/2024 BIRADS 1  Last Colonoscopy: 2023  Obstetric History OB History  Gravida Para Term Preterm AB Living  2     2  SAB IAB Ectopic Multiple Live Births      2    # Outcome Date GA Lbr Len/2nd Weight Sex Type Anes PTL Lv  2 Gravida           1 Gravida             Past Medical History:   Diagnosis Date   Allergic rhinitis    Anal fissure    Chronic idiopathic constipation    Colon polyps    Endometriosis    Pulled muscle 10/2022    Past Surgical History:  Procedure Laterality Date   ABDOMINAL HYSTERECTOMY     ANAL FISSURE REPAIR N/A 01/04/2023   Procedure: ANAL DILATION;  Surgeon: Debby Hila, MD;  [facility];  Service: General;  Laterality: N/A;   CESAREAN SECTION     HEMORRHOID SURGERY     SPHINCTEROTOMY N/A 01/04/2023   Procedure: CHEMICAL SPHINCTEROTOMY;  Surgeon: Debby Hila, MD;  Location: [facility];  Service: General;  Laterality: N/A;   VASCULAR SURGERY      Current Outpatient Medications on File Prior to Visit  Medication Sig Dispense Refill   albuterol  (VENTOLIN  HFA) 108 (90 Base) MCG/ACT inhaler Inhale 1-2 puffs into the lungs every 6 (six) hours as needed for wheezing or shortness of breath. 8 g 2   AMBULATORY NON FORMULARY MEDICATION Medication Name: Diltiazem 2% with lidocaine  2% cream.  Apply a pea sized amount per rectum 3 times a day [facility] 30 g 0   azelastine  (ASTELIN ) 0.1 % nasal spray Place 2 sprays into both nostrils 2 (two) times daily. 60 mL 3   calcium  carbonate (OS-CAL - DOSED IN MG OF ELEMENTAL CALCIUM ) 1250 (500 Ca) MG tablet Take 1 tablet by  mouth.     cyclobenzaprine  (FLEXERIL ) 5 MG tablet Take 1 tablet (5 mg total) by mouth daily as needed for muscle spasms. 10 tablet 3   fexofenadine  (ALLEGRA ) 180 MG tablet Take 1 tablet (180 mg total) by mouth daily as needed for allergies or rhinitis. 90 tablet 3   fluticasone  (FLONASE ) 50 MCG/ACT nasal spray Place 2 sprays into both nostrils daily. 16 g 3   meloxicam  (MOBIC ) 7.5 MG tablet Take 1 tablet (7.5 mg total) by mouth daily as needed for pain. 30 tablet 2   montelukast  (SINGULAIR ) 10 MG tablet Take 1 tablet (10 mg total) by mouth at bedtime as needed (allergy conjuctivitis and rhiitis). 90 tablet 3   Multiple Vitamins-Minerals  (MULTIVITAMIN WOMEN 50+ PO) Take by mouth.     Olopatadine  HCl 0.2 % SOLN PLACE 1 DROP INTO BOTH EYES DAILY AS NEEDED (ITCHY, RED, DISCHARGE FROM ALLERGIES). 2.5 mL 11   cholecalciferol (VITAMIN D3) 25 MCG (1000 UNIT) tablet Take 1,000 Units by mouth daily. (Patient not taking: Reported on 05/18/2024)     fluconazole  (DIFLUCAN ) 150 MG tablet Take 1 tablet by mouth once. Repeat dose in 3 days if symptoms persist. (Patient not taking: Reported on 05/18/2024) 2 tablet 0   latanoprost (XALATAN) 0.005 % ophthalmic solution latanoprost 0.005 % eye drops  INSTILL 1 DROP INTO BOTH EYES AT BEDTIME (Patient not taking: Reported on 05/18/2024)     methylPREDNISolone  (MEDROL  DOSEPAK) 4 MG TBPK tablet 6 day dose pack - take as directed (Patient not taking: Reported on 05/18/2024) 21 tablet 0   Pseudoephedrine  HCl 240 MG TB24 Take 1 tablet (240 mg total) by mouth daily as needed (congestion). (Patient not taking: Reported on 05/18/2024) 90 tablet 3   rosuvastatin  (CRESTOR ) 10 MG tablet Take 1 tablet (10 mg total) by mouth daily. (Patient not taking: Reported on 05/18/2024) 90 tablet 2   No current facility-administered medications on file prior to visit.    Allergies  Allergen Reactions   Morphine Itching    Social History:  reports that she quit smoking about 10 years ago. Her smoking use included cigarettes. She has never used smokeless tobacco. She reports current alcohol use of about 2.0 standard drinks of alcohol per week. She reports that she does not use drugs.  Family History  Problem Relation Age of Onset   Ovarian cancer Mother        mets to eye   Hypertension Mother    Stroke Father    Hypertension Father    Hypertension Brother    Anuerysm Maternal Grandmother    Colon cancer Maternal Grandfather    Stroke Maternal Grandfather    Asthma Daughter    Asthma Son    Breast cancer Neg Hx    Esophageal cancer Neg Hx    Rectal cancer Neg Hx    Stomach cancer Neg Hx     The following portions of  the patient's history were reviewed and updated as appropriate: allergies, current medications, past family history, past medical history, past social history, past surgical history and problem list.  Review of Systems Pertinent items noted in HPI and remainder of comprehensive ROS otherwise negative.  Physical Exam:  BP 113/74 (BP Location: Left Arm, Patient Position: Sitting, Cuff Size: Normal)   Pulse 69   Wt 135 lb (61.2 kg)   BMI 22.47 kg/m  Physical Exam Vitals and nursing note reviewed. Exam conducted with a chaperone present.  Constitutional:      Appearance: Normal appearance.  Pulmonary:  Effort: Pulmonary effort is normal.  Abdominal:     Palpations: Abdomen is soft.  Genitourinary:    General: Normal vulva.     Exam position: Lithotomy position.     Comments: Atrophic vaginal introitus  Mild pelvic floor tenderness, primarily due to dryness Neurological:     Mental Status: She is alert.      Assessment and Plan:   Assessment & Plan Postmenopausal atrophic vaginitis with dyspareunia Postmenopausal atrophic vaginitis with dryness and dyspareunia due to lapse in vaginal estrogen therapy. Vaginal estrogen cream effective but messy. Estring  uncomfortable. Endometriosis likely inactive post-hysterectomy. Pelvic floor dysfunction may contribute to dyspareunia. - Prescribed Vagifem , sent prescription to [facility]. - Advised using Vagifem  nightly for two weeks, then twice weekly. - Discussed alternative treatments if Vagifem  not covered, including Invexi and Estring . - Recommended lubricant use with vaginal estrogen for intercourse and dryness. - Suggested coconut oil as alternative lubricant. - Consider pelvic floor physical therapy for persistent dyspareunia. Declines for now.   Routine preventative health maintenance measures emphasized. Please refer to After Visit Summary for other counseling recommendations.   Follow-up: No follow-ups on file.      Carter Quarry, MD Obstetrician & Gynecologist, Faculty [facility] [employer], [facility]

## 2024-06-06 ENCOUNTER — Other Ambulatory Visit: Payer: Self-pay | Admitting: Cardiology

## 2024-07-12 ENCOUNTER — Ambulatory Visit: Payer: Self-pay

## 2024-07-12 NOTE — Telephone Encounter (Signed)
 FYI Only or Action Required?: FYI only for provider.  Patient was last seen in primary care on 04/05/2024 by Moishe Chiquita HERO, NP.  Called Nurse Triage reporting Flank Pain.  Symptoms began yesterday.  Interventions attempted: Rest, hydration, or home remedies.  Symptoms are: unchanged.  Triage Disposition: See HCP Within 4 Hours (Or PCP Triage)  Patient/caregiver understands and will follow disposition?: Yes, but will wait  Copied from CRM #[id number]. Topic: Clinical - Red Word Triage >> Jul 12, 2024  3:31 PM Shereese L wrote: [facility] that prompted transfer to Nurse Triage: patient stated that she has a UTI, pain in right lower waist area Reason for Disposition  Side (flank) or lower back pain present  Answer Assessment - Initial Assessment Questions 1. SEVERITY: How bad is the pain?  (e.g., Scale 1-10; mild, moderate, or severe)     No burning 2. FREQUENCY: How many times have you had painful urination today?      increased 3. PATTERN: Is pain present every time you urinate or just sometimes?      No burning or pain during urination 4. ONSET: When did the increased urination start?      07/11/24 5. FEVER: Do you have a fever? If Yes, ask: What is your temperature, how was it measured, and when did it start?     denies 6. PAST UTI: Have you had a urine infection before? If Yes, ask: When was the last time? and What happened that time?      yes 7. CAUSE: What do you think is causing the painful urination?  (e.g., UTI, scratch, Herpes sore)     uti 8. OTHER SYMPTOMS: Do you have any other symptoms? (e.g., blood in urine, flank pain, genital sores, urgency, vaginal discharge)     Mild Lower back and side pain 9. PREGNANCY: Is there any chance you are pregnant? When was your last menstrual period?  Protocols used: Urination Pain - Female-A-AH

## 2024-07-13 ENCOUNTER — Ambulatory Visit: Admitting: Family Medicine

## 2024-07-13 ENCOUNTER — Encounter: Payer: Self-pay | Admitting: Family Medicine

## 2024-07-13 VITALS — BP 112/68 | HR 66 | Ht 65.0 in | Wt 135.0 lb

## 2024-07-13 DIAGNOSIS — R350 Frequency of micturition: Secondary | ICD-10-CM

## 2024-07-13 DIAGNOSIS — N3000 Acute cystitis without hematuria: Secondary | ICD-10-CM

## 2024-07-13 LAB — POC URINALSYSI DIPSTICK (AUTOMATED)
Bilirubin, UA: NEGATIVE
Blood, UA: POSITIVE
Glucose, UA: NEGATIVE
Ketones, UA: NEGATIVE
Nitrite, UA: NEGATIVE
Protein, UA: NEGATIVE
Spec Grav, UA: 1.01 (ref 1.010–1.025)
Urobilinogen, UA: 0.2 U/dL
pH, UA: 7.5 (ref 5.0–8.0)

## 2024-07-13 MED ORDER — SULFAMETHOXAZOLE-TRIMETHOPRIM 800-160 MG PO TABS: 1.0000 | ORAL_TABLET | Freq: Two times a day (BID) | ORAL | 0 refills | Status: AC

## 2024-07-13 NOTE — Progress Notes (Signed)
 [facility]  Established Patient Office Visit   Subjective:  Patient ID: Alyssa Cox, female    DOB: Aug 17, 1961  Age: 63 y.o. MRN: [id number]  Chief Complaint  Patient presents with   Urinary Frequency    Since yesterday, also some right flank/back pain, denies f/n/v/adb pain    Urinary Frequency  Associated symptoms include frequency. Pertinent negatives include no flank pain or hematuria.   Encounter Diagnoses  Name Primary?   Urinary frequency Yes   Acute cystitis without hematuria    1 day history of increased urinary frequency, incomplete emptying and lower back discomfort.  There has been some suprapubic pressure.  No fevers chills, dysuria, or nausea and vomiting.    Review of Systems  Constitutional: Negative.   HENT: Negative.    Eyes:  Negative for blurred vision, discharge and redness.  Respiratory: Negative.    Cardiovascular: Negative.   Gastrointestinal:  Negative for abdominal pain.  Genitourinary:  Positive for frequency. Negative for dysuria, flank pain and hematuria.  Musculoskeletal: Negative.  Negative for myalgias.  Skin:  Negative for rash.  Neurological:  Negative for tingling, loss of consciousness and weakness.  Endo/Heme/Allergies:  Negative for polydipsia.     Current Outpatient Medications:    albuterol  (VENTOLIN  HFA) 108 (90 Base) MCG/ACT inhaler, Inhale 1-2 puffs into the lungs every 6 (six) hours as needed for wheezing or shortness of breath., Disp: 8 g, Rfl: 2   AMBULATORY NON FORMULARY MEDICATION, Medication Name: Diltiazem 2% with lidocaine  2% cream.  Apply a pea sized amount per rectum 3 times a day [employer], Disp: 30 g, Rfl: 0   azelastine  (ASTELIN ) 0.1 % nasal spray, Place 2 sprays into both nostrils 2 (two) times daily., Disp: 60 mL, Rfl: 3   cholecalciferol (VITAMIN D3) 25 MCG (1000 UNIT) tablet, Take 1,000 Units by mouth daily., Disp: , Rfl:    cyclobenzaprine  (FLEXERIL ) 5 MG tablet, Take 1 tablet (5 mg total) by mouth daily  as needed for muscle spasms., Disp: 10 tablet, Rfl: 3   fexofenadine  (ALLEGRA ) 180 MG tablet, Take 1 tablet (180 mg total) by mouth daily as needed for allergies or rhinitis., Disp: 90 tablet, Rfl: 3   fluticasone  (FLONASE ) 50 MCG/ACT nasal spray, Place 2 sprays into both nostrils daily., Disp: 16 g, Rfl: 3   meloxicam  (MOBIC ) 7.5 MG tablet, Take 1 tablet (7.5 mg total) by mouth daily as needed for pain., Disp: 30 tablet, Rfl: 2   montelukast  (SINGULAIR ) 10 MG tablet, Take 1 tablet (10 mg total) by mouth at bedtime as needed (allergy conjuctivitis and rhiitis)., Disp: 90 tablet, Rfl: 3   Multiple Vitamins-Minerals (MULTIVITAMIN WOMEN 50+ PO), Take by mouth., Disp: , Rfl:    Olopatadine  HCl 0.2 % SOLN, PLACE 1 DROP INTO BOTH EYES DAILY AS NEEDED (ITCHY, RED, DISCHARGE FROM ALLERGIES)., Disp: 2.5 mL, Rfl: 11   oxyCODONE  (OXY IR/ROXICODONE ) 5 MG immediate release tablet, Take 5 mg by mouth every 4 (four) hours as needed., Disp: , Rfl:    rosuvastatin  (CRESTOR ) 10 MG tablet, TAKE 1 TABLET BY MOUTH EVERY DAY, Disp: 90 tablet, Rfl: 0   sulfamethoxazole -trimethoprim  (BACTRIM  DS) 800-160 MG tablet, Take 1 tablet by mouth 2 (two) times daily for 6 days., Disp: 12 tablet, Rfl: 0   triamcinolone (KENALOG) 0.025 % cream, Apply 1 Application topically., Disp: , Rfl:    calcium  carbonate (OS-CAL - DOSED IN MG OF ELEMENTAL CALCIUM ) 1250 (500 Ca) MG tablet, Take 1 tablet by mouth. (Patient not taking: Reported on  07/13/2024), Disp: , Rfl:    Estradiol  10 MCG TABS vaginal tablet, Place 1 tablet (10 mcg total) vaginally 2 (two) times a week., Disp: 30 tablet, Rfl: 4   fluconazole  (DIFLUCAN ) 150 MG tablet, Take 1 tablet by mouth once. Repeat dose in 3 days if symptoms persist. (Patient not taking: Reported on 05/18/2024), Disp: 2 tablet, Rfl: 0   latanoprost (XALATAN) 0.005 % ophthalmic solution, latanoprost 0.005 % eye drops  INSTILL 1 DROP INTO BOTH EYES AT BEDTIME (Patient not taking: Reported on 05/18/2024), Disp: , Rfl:     methylPREDNISolone  (MEDROL  DOSEPAK) 4 MG TBPK tablet, 6 day dose pack - take as directed (Patient not taking: Reported on 05/18/2024), Disp: 21 tablet, Rfl: 0   Pseudoephedrine  HCl 240 MG TB24, Take 1 tablet (240 mg total) by mouth daily as needed (congestion). (Patient not taking: Reported on 05/18/2024), Disp: 90 tablet, Rfl: 3   Objective:     BP 112/68   Pulse 66   Ht 5' 5 (1.651 m)   Wt 135 lb (61.2 kg)   SpO2 98%   BMI 22.47 kg/m    Physical Exam Constitutional:      General: She is not in acute distress.    Appearance: Normal appearance. She is not ill-appearing, toxic-appearing or diaphoretic.  HENT:     Head: Normocephalic and atraumatic.     Right Ear: External ear normal.     Left Ear: External ear normal.  Eyes:     General: No scleral icterus.       Right eye: No discharge.        Left eye: No discharge.     Extraocular Movements: Extraocular movements intact.     Conjunctiva/sclera: Conjunctivae normal.  Cardiovascular:     Rate and Rhythm: Normal rate and regular rhythm.  Pulmonary:     Effort: Pulmonary effort is normal. No respiratory distress.     Breath sounds: No wheezing or rales.  Abdominal:     General: Bowel sounds are normal.     Tenderness: There is no abdominal tenderness. There is no right CVA tenderness, left CVA tenderness or guarding.  Skin:    General: Skin is warm and dry.  Neurological:     Mental Status: She is alert and oriented to person, place, and time.  Psychiatric:        Mood and Affect: Mood normal.        Behavior: Behavior normal.      Results for orders placed or performed in visit on 07/13/24  POCT Urinalysis Dipstick (Automated)  Result Value Ref Range   Color, UA yellow    Clarity, UA clear    Glucose, UA Negative Negative   Bilirubin, UA neg    Ketones, UA neg    Spec Grav, UA 1.010 1.010 - 1.025   Blood, UA pos    pH, UA 7.5 5.0 - 8.0   Protein, UA Negative Negative   Urobilinogen, UA 0.2 0.2 or 1.0  E.U./dL   Nitrite, UA neg    Leukocytes, UA Large (3+) (A) Negative      The ASCVD Risk score (Arnett DK, et al., 2019) failed to calculate for the following reasons:   The valid HDL cholesterol range is 20 to 100 mg/dL    Assessment & Plan:   Urinary frequency -     POCT Urinalysis Dipstick (Automated)  Acute cystitis without hematuria -     Urine Culture -     Sulfamethoxazole -Trimethoprim ; Take 1 tablet by  mouth 2 (two) times daily for 6 days.  Dispense: 12 tablet; Refill: 0    Return in about 1 week (around 07/20/2024), or if symptoms worsen or fail to improve.  Septra  DS twice daily for 6 days.  Information given on Septra  and UTIs.  Follow-up next week if not improving.  Elsie Sim Lent, MD

## 2024-07-13 NOTE — Telephone Encounter (Signed)
 MyChart message sent asking patient to schedule OV for evaluation.

## 2024-07-14 LAB — URINE CULTURE
MICRO NUMBER:: 17022875
Result:: NO GROWTH
SPECIMEN QUALITY:: ADEQUATE

## 2024-07-14 LAB — EXTRA URINE SPECIMEN

## 2024-07-16 ENCOUNTER — Ambulatory Visit: Payer: Self-pay | Admitting: Family Medicine

## 2024-07-24 ENCOUNTER — Other Ambulatory Visit: Payer: Self-pay | Admitting: Obstetrics and Gynecology

## 2024-07-24 DIAGNOSIS — N958 Other specified menopausal and perimenopausal disorders: Secondary | ICD-10-CM

## 2024-07-30 ENCOUNTER — Ambulatory Visit: Admitting: Family Medicine

## 2024-07-30 ENCOUNTER — Encounter: Payer: Self-pay | Admitting: Family Medicine

## 2024-07-30 VITALS — BP 116/82 | HR 67 | Temp 97.4°F | Ht 65.0 in | Wt 136.2 lb

## 2024-07-30 DIAGNOSIS — E7800 Pure hypercholesterolemia, unspecified: Secondary | ICD-10-CM

## 2024-07-30 DIAGNOSIS — R350 Frequency of micturition: Secondary | ICD-10-CM

## 2024-07-30 DIAGNOSIS — Z0000 Encounter for general adult medical examination without abnormal findings: Secondary | ICD-10-CM

## 2024-07-30 NOTE — Progress Notes (Signed)
 Annual Wellness Visit     Patient: Alyssa Cox, Female    DOB: Jan 11, 1961, 63 y.o.   MRN: [id number]  Subjective  Chief Complaint  Patient presents with   welcome to medicare    Welcome to medicare. Pt is not fasting.     Alyssa Cox is a 63 y.o. female who presents today for her Annual Wellness Visit. She reports consuming a general diet. Home exercise routine includes calisthenics and walking .5 hrs per day. She generally feels well. She reports sleeping fairly well. She does not have additional problems to discuss today.   HPI  Vision:Within last year .  She is seeing the dentist regularly.  Has regular follow-up with her GYN provider.  She sees the dermatologist yearly for skin checks.  She has seasonal allergy symptoms that are relieved with Flonase  Astelin  and Allegra .  Continues rosuvastatin  for elevated ldl cholesterol.  Continues Lexapro for anxiety with depression.  Urinary frequency has resolved after starting Yuvafem .  Uses melatonin as needed for insomnia.      Medications: Outpatient Medications Prior to Visit  Medication Sig   albuterol  (VENTOLIN  HFA) 108 (90 Base) MCG/ACT inhaler Inhale 1-2 puffs into the lungs every 6 (six) hours as needed for wheezing or shortness of breath.   azelastine  (ASTELIN ) 0.1 % nasal spray Place 2 sprays into both nostrils 2 (two) times daily.   cholecalciferol (VITAMIN D3) 25 MCG (1000 UNIT) tablet Take 1,000 Units by mouth daily.   cyclobenzaprine  (FLEXERIL ) 5 MG tablet Take 1 tablet (5 mg total) by mouth daily as needed for muscle spasms.   fexofenadine  (ALLEGRA ) 180 MG tablet Take 1 tablet (180 mg total) by mouth daily as needed for allergies or rhinitis.   fluticasone  (FLONASE ) 50 MCG/ACT nasal spray Place 2 sprays into both nostrils daily.   meloxicam  (MOBIC ) 7.5 MG tablet Take 1 tablet (7.5 mg total) by mouth daily as needed for pain.   montelukast  (SINGULAIR ) 10 MG tablet Take 1 tablet (10 mg total) by mouth at bedtime as  needed (allergy conjuctivitis and rhiitis).   Multiple Vitamins-Minerals (MULTIVITAMIN WOMEN 50+ PO) Take by mouth.   rosuvastatin  (CRESTOR ) 10 MG tablet TAKE 1 TABLET BY MOUTH EVERY DAY   triamcinolone (KENALOG) 0.025 % cream Apply 1 Application topically.   YUVAFEM  10 MCG TABS vaginal tablet PLACE 1 TABLET VAGINALLY 2 TIMES A WEEK.   AMBULATORY NON FORMULARY MEDICATION Medication Name: Diltiazem 2% with lidocaine  2% cream.  Apply a pea sized amount per rectum 3 times a day [facility]   calcium  carbonate (OS-CAL - DOSED IN MG OF ELEMENTAL CALCIUM ) 1250 (500 Ca) MG tablet Take 1 tablet by mouth. (Patient not taking: Reported on 07/30/2024)   fluconazole  (DIFLUCAN ) 150 MG tablet Take 1 tablet by mouth once. Repeat dose in 3 days if symptoms persist. (Patient not taking: Reported on 07/30/2024)   [DISCONTINUED] latanoprost (XALATAN) 0.005 % ophthalmic solution latanoprost 0.005 % eye drops  INSTILL 1 DROP INTO BOTH EYES AT BEDTIME (Patient not taking: Reported on 05/18/2024)   [DISCONTINUED] methylPREDNISolone  (MEDROL  DOSEPAK) 4 MG TBPK tablet 6 day dose pack - take as directed (Patient not taking: Reported on 05/18/2024)   [DISCONTINUED] Olopatadine  HCl 0.2 % SOLN PLACE 1 DROP INTO BOTH EYES DAILY AS NEEDED (ITCHY, RED, DISCHARGE FROM ALLERGIES).   [DISCONTINUED] oxyCODONE  (OXY IR/ROXICODONE ) 5 MG immediate release tablet Take 5 mg by mouth every 4 (four) hours as needed.   [DISCONTINUED] Pseudoephedrine  HCl 240 MG TB24 Take 1 tablet (  240 mg total) by mouth daily as needed (congestion). (Patient not taking: Reported on 05/18/2024)   No facility-administered medications prior to visit.    Allergies  Allergen Reactions   Morphine Itching    Patient Care Team: Berneta Elsie Sayre, MD as PCP - General (Family Medicine)  Review of Systems  Constitutional: Negative.   HENT: Negative.    Eyes:  Negative for blurred vision, discharge and redness.  Respiratory: Negative.    Cardiovascular:  Negative.   Gastrointestinal:  Negative for abdominal pain.  Genitourinary: Negative.   Musculoskeletal: Negative.  Negative for myalgias.  Skin:  Negative for rash.  Neurological:  Negative for tingling, loss of consciousness and weakness.  Endo/Heme/Allergies:  Negative for polydipsia.        Objective  BP 116/82 (BP Location: Right Arm, Patient Position: Sitting, Cuff Size: Normal)   Pulse 67   Temp (!) 97.4 F (36.3 C) (Temporal)   Ht 5' 5 (1.651 m)   Wt 136 lb 3.2 oz (61.8 kg)   SpO2 99%   BMI 22.66 kg/m     Physical Exam Constitutional:      General: She is not in acute distress.    Appearance: Normal appearance. She is not ill-appearing, toxic-appearing or diaphoretic.  HENT:     Head: Normocephalic and atraumatic.     Right Ear: Tympanic membrane, ear canal and external ear normal.     Left Ear: Tympanic membrane, ear canal and external ear normal.     Mouth/Throat:     Mouth: Mucous membranes are moist.     Pharynx: Oropharynx is clear. No oropharyngeal exudate or posterior oropharyngeal erythema.  Eyes:     General: No scleral icterus.       Right eye: No discharge.        Left eye: No discharge.     Extraocular Movements: Extraocular movements intact.     Conjunctiva/sclera: Conjunctivae normal.     Pupils: Pupils are equal, round, and reactive to light.  Cardiovascular:     Rate and Rhythm: Normal rate and regular rhythm.  Pulmonary:     Effort: Pulmonary effort is normal. No respiratory distress.     Breath sounds: Normal breath sounds. No wheezing or rales.  Abdominal:     General: Bowel sounds are normal.     Tenderness: There is no right CVA tenderness or left CVA tenderness.  Musculoskeletal:     Cervical back: No rigidity or tenderness.  Skin:    General: Skin is warm and dry.  Neurological:     Mental Status: She is alert and oriented to person, place, and time.  Psychiatric:        Mood and Affect: Mood normal.        Behavior: Behavior  normal.       Most recent functional status assessment:    07/30/2024   10:31 AM  In your present state of health, do you have any difficulty performing the following activities:  Hearing? 0  Vision? 0  Difficulty concentrating or making decisions? 0  Walking or climbing stairs? 0  Dressing or bathing? 0  Doing errands, shopping? 0  Preparing Food and eating ? N  Using the Toilet? N  In the past six months, have you accidently leaked urine? Y  Do you have problems with loss of bowel control? N  Managing your Medications? N  Managing your Finances? N  Housekeeping or managing your Housekeeping? N   Most recent fall risk assessment:  07/30/2024   10:43 AM  Fall Risk   Risk for fall due to : No Fall Risks  Follow up Falls evaluation completed    Most recent depression screenings:    07/30/2024   10:36 AM 05/18/2024    9:31 AM  PHQ 2/9 Scores  PHQ - 2 Score 0 0  PHQ- 9 Score  1   Most recent cognitive screening:    07/30/2024   10:40 AM  6CIT Screen  What Year? 0 points  What month? 0 points  What time? 0 points  Count back from 20 0 points  Months in reverse 0 points  Repeat phrase 0 points  Total Score 0 points   Most recent Audit-C alcohol use screening    07/28/2024    8:45 AM  Alcohol Use Disorder Test (AUDIT)  1. How often do you have a drink containing alcohol? 4  2. How many drinks containing alcohol do you have on a typical day when you are drinking? 1  3. How often do you have six or more drinks on one occasion? 0  AUDIT-C Score 5   4. How often during the last year have you found that you were not able to stop drinking once you had started? 0  5. How often during the last year have you failed to do what was normally expected from you because of drinking? 0  6. How often during the last year have you needed a first drink in the morning to get yourself going after a heavy drinking session? 0  7. How often during the last year have you had a  feeling of guilt of remorse after drinking? 1  8. How often during the last year have you been unable to remember what happened the night before because you had been drinking? 0  9. Have you or someone else been injured as a result of your drinking? 0  10. Has a relative or friend or a doctor or another health worker been concerned about your drinking or suggested you cut down? 0  Alcohol Use Disorder Identification Test Final Score (AUDIT) 6      Patient-reported   A score of 3 or more in women, and 4 or more in men indicates increased risk for alcohol abuse, EXCEPT if all of the points are from question 1   Vision/Hearing Screen: Hearing Screening   500Hz  1000Hz  2000Hz  4000Hz   Right ear Fail Pass Pass Pass  Left ear Pass Pass Pass Fail   Vision Screening   Right eye Left eye Both eyes  Without correction     With correction 20/20 20/20 20/15        No results found for any visits on 07/30/24.    Assessment & Plan   Annual wellness visit done today including the all of the following: Reviewed patient's Family Medical History Reviewed and updated list of patient's medical providers Assessment of cognitive impairment was done Assessed patient's functional ability Established a written schedule for health screening services Health Risk Assessent Completed and Reviewed  Exercise Activities and Dietary recommendations  Goals      Set My Weight Loss Goal     Follow Up Date 10/18/2024    - set weight loss goal    Why is this important?   Losing only 5 to 15 percent of your weight makes a big difference in your health.    Notes: Pt wasn't to be 132lbs        Immunization History  Administered  Date(s) Administered   Influenza,inj,Quad PF,6+ Mos 06/22/2022   Influenza-Unspecified 07/27/2021   PFIZER(Purple Top)SARS-COV-2 Vaccination 05/16/2020, 06/01/2020, 07/27/2021   Tdap 08/02/2019   Zoster Recombinant(Shingrix) 09/23/2021, 12/17/2021    Health Maintenance   Topic Date Due   Medicare Annual Wellness (AWV)  Never done   Pneumococcal Vaccine: 50+ Years (1 of 2 - PCV) Never done   Colonoscopy  02/11/2025   Mammogram  03/20/2026   DTaP/Tdap/Td (2 - Td or Tdap) 08/01/2029   DEXA SCAN  Completed   Hepatitis C Screening  Completed   HIV Screening  Completed   Zoster Vaccines- Shingrix  Completed   Hepatitis B Vaccines 19-59 Average Risk  Aged Out   Meningococcal B Vaccine  Aged Out   Influenza Vaccine  Discontinued   COVID-19 Vaccine  Discontinued     Discussed health benefits of physical activity, and encouraged her to engage in regular exercise appropriate for her age and condition.    Problem List Items Addressed This Visit       Other   Elevated LDL cholesterol level   Other Visit Diagnoses       Urinary frequency    -  Primary       Return Return in March for scheduled physical., for annual physical, chronic disease follow-up.      Elsie Sim Lent, MD

## 2024-08-17 ENCOUNTER — Ambulatory Visit: Payer: Self-pay

## 2024-08-17 NOTE — Telephone Encounter (Signed)
 FYI Only or Action Required?: FYI only for provider: Given Tetanus shot information.  Patient was last seen in primary care on 07/30/2024 by Alyssa Elsie Sayre, MD.  Called Nurse Triage reporting Animal Bite.  Symptoms began a week ago.  Interventions attempted: OTC medications: Antiseptic spray, neosporin.  Symptoms are: gradually improving.  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 Patient calling in just to see when last tetanus shot was. Given information.   1. APPEARANCE What does it look like?  (e.g., abrasion, bruise, puncture)      Pink, red dot where tooth mark was. Denies signs of infection.  2. SIZE: How big is the bite? (e.g., inches, cm; or compare to size of coin, pea, grape, ping pong ball)      2 inches  3. LOCATION: Where is the bite located?      Back left leg, thigh  4. ONSET: When did the bite happen? (e.g., minutes, hours ago)      Last week  5. ANIMAL: What type of animal caused the bite? Is the injury from a bite or a claw? If the animal is a dog or a cat, ask: Was it a pet or a stray? Was it acting ill or behaving strangely?     Dog  6. RABIES VACCINE: For dog or cat bites, ask: Do you know if the pet is vaccinated against rabies?  (e.g., yes, no, overdue for rabies shot, unknown)     Yes  7. CIRCUMSTANCES: Tell me how this happened.      Taking out trash while on vacation, dog came up behind her and bit her  8. TETANUS: When was your last tetanus booster?     08/02/2019  Protocols used: Animal Bite-A-AH  Copied from CRM [id number]. Topic: Clinical - Red Word Triage >> Aug 17, 2024 11:06 AM [name] wrote: [facility] that prompted transfer to Nurse  Triage: Patient was on vacation last week, was bit by dog  and still hurts her and there but wants to know if she is up to date on her tetanus shot or if she needs one

## 2024-08-20 ENCOUNTER — Encounter: Payer: Self-pay | Admitting: Radiology

## 2024-09-02 ENCOUNTER — Other Ambulatory Visit: Payer: Self-pay | Admitting: Cardiology

## 2024-10-01 ENCOUNTER — Telehealth: Payer: Self-pay | Admitting: Cardiology

## 2024-10-05 MED ORDER — ROSUVASTATIN CALCIUM 10 MG PO TABS: 10.0000 mg | ORAL_TABLET | Freq: Every day | ORAL | 0 refills | Status: DC

## 2024-10-05 NOTE — Addendum Note (Signed)
 Addended by: MEMORY DELON POUR on: 10/05/2024 11:55 AM   Modules accepted: Orders

## 2024-10-05 NOTE — Telephone Encounter (Signed)
 Pt c/o medication issue:  1. Name of Medication:   rosuvastatin  (CRESTOR ) 10 MG tablet   2. How are you currently taking this medication (dosage and times per day)?     3. Are you having a reaction (difficulty breathing--STAT)?   4. What is your medication issue?   Patient stated she has been taking Rosuvastatin  with Calcium  and will need a refill prescription sent to [id number] - [place], [place] - [address] and has some medication left.  Patient has an appointment scheduled with Dr. Elmira on 1/20.

## 2024-10-05 NOTE — Telephone Encounter (Signed)
 Pt scheduled 11/06/24, refill sent.

## 2024-11-06 ENCOUNTER — Ambulatory Visit: Admitting: Cardiology

## 2024-11-06 NOTE — Progress Notes (Unsigned)
" °  Cardiology Office Note:  .   Date:  11/06/2024  ID:  Alyssa Cox, DOB Aug 30, 1961, MRN [id number] PCP: Berneta Elsie Sayre, MD  [facility] Providers Cardiologist:  Newman Lawrence, MD PCP: Berneta Elsie Sayre, MD  No chief complaint on file.    Alyssa Cox is a 64 y.o. female with coronary calcification, palpitations  Discussed the use of AI scribe software for clinical note transcription with the patient, who gave verbal consent to proceed.  History of Present Illness       There were no vitals filed for this visit.    ROS      Studies Reviewed: .        ***  CT cardiac scoring 2024: Total score 35, all in LAD, 72nd percentile  Echocardiogram 2024:  1. Left ventricular ejection fraction, by estimation, is 60 to 65%. The  left ventricle has normal function. The left ventricle has no regional  wall motion abnormalities. Left ventricular diastolic parameters were  normal. The average left ventricular  global longitudinal strain is -23.8 %. The global longitudinal strain is  normal.   2. Right ventricular systolic function is normal. The right ventricular  size is normal.   3. The mitral valve is normal in structure. Trivial mitral valve  regurgitation. No evidence of mitral stenosis.   4. The aortic valve is tricuspid. Aortic valve regurgitation is not  visualized. Aortic valve sclerosis is present, with no evidence of aortic  valve stenosis.   5. The inferior vena cava is normal in size with greater than 50%  respiratory variability, suggesting right atrial pressure of 3 mmHg.   Comparison(s): No prior Echocardiogram.   Zio patch monitor 12 days 08/19/2023 - 08/31/2023: Dominant rhythm: Sinus. HR 49-161 bpm. Avg HR 80 bpm. 0 episodes of SVT. <1% isolated SVE, couplet/triplets. 0 episodes of VT. <1% isolated VE, no couplet/triplets. No atrial fibrillation/atrial flutter/SVT/VT/high grade AV block, sinus pause >3sec noted. 4  patient triggered events, correlated with sinus rhythm/artifact.    Labs 12/2023: Chol 210, TG 49, HDL 125, LDL 75 HbA1C 5.6% Hb 13 Cr 0.82 ***  Risk Assessment/Calculations:   {Does this patient have ATRIAL FIBRILLATION?:[phone]}    Physical Exam   VISIT DIAGNOSES: No diagnosis found.   Alyssa Cox is a 64 y.o. female with coronary calcification, palpitations  Assessment & Plan       {Are you ordering a CV Procedure (e.g. stress test, cath, DCCV, TEE, etc)?   Press F2        :[id number]}    No orders of the defined types were placed in this encounter.    F/u in ***  Signed, Newman JINNY Lawrence, MD  "

## 2024-11-07 ENCOUNTER — Ambulatory Visit: Admitting: Nurse Practitioner

## 2024-11-07 ENCOUNTER — Encounter: Payer: Self-pay | Admitting: Nurse Practitioner

## 2024-11-07 ENCOUNTER — Other Ambulatory Visit: Payer: Self-pay | Admitting: Cardiology

## 2024-11-07 ENCOUNTER — Telehealth: Payer: Self-pay | Admitting: Cardiology

## 2024-11-07 VITALS — BP 120/66 | HR 69 | Temp 98.3°F | Ht 65.0 in | Wt 134.8 lb

## 2024-11-07 DIAGNOSIS — M26622 Arthralgia of left temporomandibular joint: Secondary | ICD-10-CM

## 2024-11-07 MED ORDER — NAPROXEN 500 MG PO TABS: 500.0000 mg | ORAL_TABLET | Freq: Two times a day (BID) | ORAL | 0 refills | Status: DC

## 2024-11-07 NOTE — Patient Instructions (Signed)
 Hold meloxicam  while taking naproxen . Take with food Continue with use of mouth guard Start jaw exercise Apply warm compress prior to exercise and cold compress after exercise.  Jaw Range of Motion Exercises Jaw range of motion exercises are exercises that help your jaw move better. Exercises that help you have good posture (postural exercises) also help relieve jaw discomfort. These are often done along with range of motion exercises. These exercises can help prevent or improve: Trouble opening your mouth. Pain in your jaw while it is open or closed. Temporomandibular joint (TMJ) pain. Headache caused by jaw tension. Take other actions to prevent or relieve jaw pain, such as: Avoiding things that cause or increase jaw pain. This may include: Chewing gum or eating hard foods. Clenching your jaw or teeth, grinding your teeth, or keeping tension in your jaw muscles. Opening your mouth wide, such as for a big yawn. Leaning on your jaw, such as resting your jaw in your hand while leaning on a desk. Putting ice on your jaw. To do this: Put ice in a plastic bag. Place a towel between your skin and the bag. Leave the ice on for 20 minutes, 2-3 times a day. Remove the ice if your skin turns bright red. This is very important. If you cannot feel pain, heat, or cold, you have a greater risk of damage to the area. Only do jaw exercises that your health care provider recommends. Only move your jaw as far as it can comfortably go in each direction. Do not move your jaw into positions that cause pain. Range of motion exercises Repeat each of these exercises 8 times, 1-2 times a day, or as told by your health care provider. Exercise A: Forward protrusion  Push your jaw forward. Hold this position for 1-2 seconds. Let your jaw return to its normal position and rest it there for 1-2 seconds. Exercise B: Controlled opening  Stand or sit in front of a mirror. Place your tongue on the roof of your mouth,  just behind your top teeth. Keeping your tongue on the roof of your mouth, slowly open and close your mouth. While you open and close your mouth, watch your jaw in the mirror. Try to keep your jaw from moving to one side or the other. Exercise C: Right and left motion  Move your jaw right. Hold this position for 1-2 seconds. Let your jaw return to its normal position, and rest it there for 1-2 seconds. Move your jaw left. Hold this position for 1-2 seconds. Let your jaw return to its normal position, and rest it there for 1-2 seconds. Postural exercises Exercise A: Chin tucks  You can do this exercise sitting, standing, or lying down. Move your head straight back, keeping your head level. You can guide the movement by placing your fingers on your chin to push your jaw back in an even motion. You should be able to feel a double chin form at the end of the motion. Hold this position for 5 seconds. Repeat 10-15 times. Exercise B: Shoulder blade squeeze  Sit or stand. Bend your elbows to about 90 degrees, which is the shape of a capital letter L. Keep your upper arms by your body. Squeeze your shoulder blades down and back, as though you were trying to touch your elbows behind you. Do not shrug your shoulders or move your head. Hold this position for 5 seconds. Repeat 10-15 times. Exercise C: Chest stretch  Stand in a doorway with one  of your feet slightly in front of the other. This is called a staggered stance. If you cannot reach your forearms to the door frame, do this exercise in a corner of a room. Put both of your hands and your forearms on the door frame, with your arms wide apart. Make sure your arms are at a 90-degree angle to your body. Place your hands on the door frame at the height of your elbows. Slowly move your weight onto your front foot until you feel a stretch across your chest and in the front of your shoulders. Keep your head and chest upright and keep your abdominal  muscles tight. Do not lean in. Hold this position for 30 seconds. Repeat 3 times. Contact a health care provider if: You have jaw pain that is new or gets worse. You have clicking or popping sounds while doing the exercises. Get help right away if: Your jaw is stuck in one place and you cannot move it. You cannot open or close your mouth. Summary Jaw range of motion exercises are exercises that help your jaw move better. Take actions to prevent or relieve jaw pain: limit chewing gum or eating hard foods; clenching your jaw or teeth; or leaning on your jaw, such as resting your jaw in your hand while leaning on a desk. Repeat each of the jaw range of motion exercises 8 times, 1-2 times a day, or as told by your health care provider. Contact a health care provider if you have clicking or popping sounds while doing the exercises. This information is not intended to replace advice given to you by your health care provider. Make sure you discuss any questions you have with your health care provider. Document Revised: 05/17/2021 Document Reviewed: 05/17/2021 Elsevier Patient Education  2024 [employer].

## 2024-11-07 NOTE — Telephone Encounter (Signed)
" °*  STAT* If patient is at the pharmacy, call can be transferred to refill team.   1. Which medications need to be refilled? (please list name of each medication and dose if known)   rosuvastatin  (CRESTOR ) 10 MG tablet     2. Would you like to learn more about the convenience, safety, & potential cost savings by using the [facility]? No    3. Are you open to using the [facility] (Type [facility].  ). No    4. Which pharmacy/location (including street and city if local pharmacy) is medication to be sent to?[id number] - [place], [place] - [address]    5. Do they need a 30 day or 90 day supply? 30 day   Pt's medication is low and would like enough medication until next visit in March   "

## 2024-11-07 NOTE — Progress Notes (Signed)
 "  Acute Office Visit  Subjective:    Patient ID: Alyssa Cox, female    DOB: 05/29/1961, 64 y.o.   MRN: [id number]  Chief Complaint  Patient presents with   Ear Fullness    Ear pressure in left ear for about 3 weeks intermittent pain swishing sound , Left eye crusty at times     Ear Fullness  There is pain in the left ear. This is a new problem. The current episode started 1 to 4 weeks ago. The problem occurs constantly. The problem has been unchanged. There has been no fever. The pain is moderate. Pertinent negatives include no abdominal pain, coughing, diarrhea, ear discharge, headaches, hearing loss, neck pain, rash, rhinorrhea, sore throat or vomiting. She has tried nothing for the symptoms. There is no history of a chronic ear infection, hearing loss or a tympanostomy tube.  Current use of mouth guard due to chronic bruxism  Show/hide medication list[1]  Reviewed past medical and social history.  Review of Systems  HENT:  Negative for ear discharge, hearing loss, rhinorrhea and sore throat.   Respiratory:  Negative for cough.   Gastrointestinal:  Negative for abdominal pain, diarrhea and vomiting.  Musculoskeletal:  Negative for neck pain.  Skin:  Negative for rash.  Neurological:  Negative for headaches.   Per HPI     Objective:    Physical Exam Vitals and nursing note reviewed.  HENT:     Head:     Jaw: Tenderness and pain on movement present. No trismus, swelling or malocclusion.     Salivary Glands: Right salivary gland is not diffusely enlarged or tender. Left salivary gland is not diffusely enlarged or tender.     Right Ear: Tympanic membrane, ear canal and external ear normal.     Left Ear: Tympanic membrane, ear canal and external ear normal.     Nose: Nose normal.     Mouth/Throat:     Mouth: Mucous membranes are moist.     Tongue: No lesions.     Palate: No mass.     Pharynx: Oropharynx is clear. Uvula midline.  Eyes:     General: Lids are normal.      Conjunctiva/sclera: Conjunctivae normal.  Neck:     Thyroid: No thyroid mass, thyromegaly or thyroid tenderness.  Musculoskeletal:     Cervical back: Normal range of motion and neck supple.  Lymphadenopathy:     Cervical: No cervical adenopathy.  Neurological:     Mental Status: She is oriented to person, place, and time.    BP 120/66 (BP Location: Left Arm, Patient Position: Sitting, Cuff Size: Normal)   Pulse 69   Temp 98.3 F (36.8 C) (Oral)   Ht 5' 5 (1.651 m)   Wt 134 lb 12.8 oz (61.1 kg)   SpO2 100%   BMI 22.43 kg/m    No results found for any visits on 11/07/24.     Assessment & Plan:   Problem List Items Addressed This Visit   None Visit Diagnoses       Arthralgia of left temporomandibular joint    -  Primary   Relevant Medications   naproxen  (NAPROSYN ) 500 MG tablet       Meds ordered this encounter  Medications   naproxen  (NAPROSYN ) 500 MG tablet    Sig: Take 1 tablet (500 mg total) by mouth 2 (two) times daily with a meal.    Dispense:  20 tablet    Refill:  0  Supervising Provider:   BERNETA FALLOW ALFRED [[id number]]   Hold meloxicam  while taking naproxen . Take with food Continue with use of mouth guard Start jaw exercise Apply warm compress prior to exercise and cold compress after exercise.  Return if symptoms worsen or fail to improve.    Roselie Mood, NP    [1]  Outpatient Medications Prior to Visit  Medication Sig   albuterol  (VENTOLIN  HFA) 108 (90 Base) MCG/ACT inhaler Inhale 1-2 puffs into the lungs every 6 (six) hours as needed for wheezing or shortness of breath.   AMBULATORY NON FORMULARY MEDICATION Medication Name: Diltiazem 2% with lidocaine  2% cream.  Apply a pea sized amount per rectum 3 times a day [facility] (Patient taking differently: as needed. Medication Name: Diltiazem 2% with lidocaine  2% cream.  Apply a pea sized amount per rectum 3 times a day [facility])   cholecalciferol (VITAMIN D3) 25 MCG  (1000 UNIT) tablet Take 1,000 Units by mouth daily.   cyclobenzaprine  (FLEXERIL ) 5 MG tablet Take 1 tablet (5 mg total) by mouth daily as needed for muscle spasms.   fluticasone  (FLONASE ) 50 MCG/ACT nasal spray Place 2 sprays into both nostrils daily.   meloxicam  (MOBIC ) 7.5 MG tablet Take 1 tablet (7.5 mg total) by mouth daily as needed for pain.   montelukast  (SINGULAIR ) 10 MG tablet Take 1 tablet (10 mg total) by mouth at bedtime as needed (allergy conjuctivitis and rhiitis).   Multiple Vitamins-Minerals (MULTIVITAMIN WOMEN 50+ PO) Take by mouth.   rosuvastatin  (CRESTOR ) 10 MG tablet Take 1 tablet (10 mg total) by mouth daily. (Patient taking differently: Take 10 mg by mouth every other day.)   triamcinolone (KENALOG) 0.025 % cream Apply 1 Application topically. (Patient taking differently: Apply 1 Application topically as needed.)   VITAMIN K PO Take 1 capsule by mouth daily at 6 (six) AM.   YUVAFEM  10 MCG TABS vaginal tablet PLACE 1 TABLET VAGINALLY 2 TIMES A WEEK.   azelastine  (ASTELIN ) 0.1 % nasal spray Place 2 sprays into both nostrils 2 (two) times daily. (Patient not taking: Reported on 11/07/2024)   fexofenadine  (ALLEGRA ) 180 MG tablet Take 1 tablet (180 mg total) by mouth daily as needed for allergies or rhinitis. (Patient not taking: Reported on 11/07/2024)   [DISCONTINUED] calcium  carbonate (OS-CAL - DOSED IN MG OF ELEMENTAL CALCIUM ) 1250 (500 Ca) MG tablet Take 1 tablet by mouth. (Patient not taking: Reported on 11/07/2024)   [DISCONTINUED] fluconazole  (DIFLUCAN ) 150 MG tablet Take 1 tablet by mouth once. Repeat dose in 3 days if symptoms persist. (Patient not taking: Reported on 11/07/2024)   No facility-administered medications prior to visit.   "

## 2024-11-12 NOTE — Telephone Encounter (Signed)
 Okay to send one time 90 day prescription with 0 refill. If she misses 3/31 appt, then recommend no more refills.  Thanks MJP

## 2024-11-13 MED ORDER — ROSUVASTATIN CALCIUM 10 MG PO TABS: 10.0000 mg | ORAL_TABLET | Freq: Every day | ORAL | 0 refills | Status: AC

## 2024-11-13 NOTE — Telephone Encounter (Signed)
 Refill sent

## 2024-11-14 ENCOUNTER — Ambulatory Visit

## 2024-11-14 ENCOUNTER — Ambulatory Visit: Admitting: Podiatry

## 2024-11-14 VITALS — Ht 65.0 in | Wt 134.0 lb

## 2024-11-14 DIAGNOSIS — T148XXA Other injury of unspecified body region, initial encounter: Secondary | ICD-10-CM | POA: Diagnosis not present

## 2024-11-14 DIAGNOSIS — S99911A Unspecified injury of right ankle, initial encounter: Secondary | ICD-10-CM

## 2024-11-14 DIAGNOSIS — M26622 Arthralgia of left temporomandibular joint: Secondary | ICD-10-CM

## 2024-11-14 MED ORDER — METHYLPREDNISOLONE 4 MG PO TBPK: ORAL_TABLET | ORAL | 0 refills | Status: AC

## 2024-11-14 MED ORDER — NAPROXEN 500 MG PO TABS: 500.0000 mg | ORAL_TABLET | Freq: Two times a day (BID) | ORAL | 0 refills | Status: AC

## 2024-11-14 NOTE — Progress Notes (Signed)
" ° °  Chief Complaint  Patient presents with   Foot Pain    RM 9 Patient is here for right ankle pain. Pt states injuring right ankle 3 weeks ago with continued pain on the lateral side.    HPI: 64 y.o. femalepresenting for evaluation of pain and tenderness associated to the lateral aspect of the right ankle after dropping an object on it.  She states that over the last 3 weeks there has been improvement but it continues to be somewhat tender.  Past Medical History:  Diagnosis Date   Allergic rhinitis    Anal fissure    Chronic idiopathic constipation    Colon polyps    Endometriosis    Pulled muscle 10/2022    Past Surgical History:  Procedure Laterality Date   ABDOMINAL HYSTERECTOMY     ANAL FISSURE REPAIR N/A 01/04/2023   Procedure: ANAL DILATION;  Surgeon: Debby Hila, MD;  Location: [facility];  Service: General;  Laterality: N/A;   CESAREAN SECTION     HEMORRHOID SURGERY     SPHINCTEROTOMY N/A 01/04/2023   Procedure: CHEMICAL SPHINCTEROTOMY;  Surgeon: Debby Hila, MD;  Location: [facility] ;  Service: General;  Laterality: N/A;   VASCULAR SURGERY      Allergies[1]   Physical Exam: General: The patient is alert and oriented x3 in no acute distress.  Dermatology: Skin is warm, dry and supple bilateral lower extremities.   Vascular: Palpable pedal pulses bilaterally. Capillary refill within normal limits.  No appreciable edema.  No erythema.  Neurological: Grossly intact via light touch  Musculoskeletal Exam: No pedal deformities noted. There is some tenderness noted overlying the distal fibula of the right ankle  Radiographic Exam RT ankle 11/14/2024:  Normal osseous mineralization. Joint spaces preserved.  No fractures or irregularities noted.  Impression: Normal exam  Assessment/Plan of Care: 1. RT fibular malleolus contusion   -Patient evaluated. Xrays reviewed -There has been improvement since the time of injury 3 weeks ago.   Continue conservative care and management -I suspect this should resolve uneventfully over time -Prescription for Medrol  Dosepak -Prescription for naproxen  500 mg twice daily after completion of the Dosepak -Recommend good supportive shoes and sneakers.  Refrain from going barefoot -Return to clinic PRN     Thresa EMERSON Sar, DPM [facility]  Dr. Thresa EMERSON Sar, DPM    [address] [place], [place] [address]                Office [phone]  Fax [phone]      RT fibular bone contusion PRN    [1]  Allergies Allergen Reactions   Morphine Itching   "

## 2024-12-03 ENCOUNTER — Ambulatory Visit (HOSPITAL_BASED_OUTPATIENT_CLINIC_OR_DEPARTMENT_OTHER): Admitting: Nurse Practitioner

## 2025-01-11 ENCOUNTER — Encounter: Admitting: Family Medicine

## 2025-01-15 ENCOUNTER — Ambulatory Visit: Admitting: Cardiology

## 2025-05-16 ENCOUNTER — Encounter: Admitting: Family Medicine
